# Patient Record
Sex: Female | Born: 1935 | Race: Black or African American | Hispanic: No | Smoking: Former smoker
Health system: Southern US, Community
[De-identification: ages and names within clinical notes are randomized; demographics above are authoritative.]

## PROBLEM LIST (undated history)

## (undated) DIAGNOSIS — I214 Non-ST elevation (NSTEMI) myocardial infarction: Secondary | ICD-10-CM

## (undated) DIAGNOSIS — Z95818 Presence of other cardiac implants and grafts: Secondary | ICD-10-CM

## (undated) DIAGNOSIS — I10 Essential (primary) hypertension: Secondary | ICD-10-CM

## (undated) DIAGNOSIS — F0390 Unspecified dementia without behavioral disturbance: Secondary | ICD-10-CM

## (undated) DIAGNOSIS — R55 Syncope and collapse: Secondary | ICD-10-CM

## (undated) DIAGNOSIS — I428 Other cardiomyopathies: Secondary | ICD-10-CM

## (undated) DIAGNOSIS — M1990 Unspecified osteoarthritis, unspecified site: Secondary | ICD-10-CM

## (undated) DIAGNOSIS — K219 Gastro-esophageal reflux disease without esophagitis: Secondary | ICD-10-CM

## (undated) DIAGNOSIS — K450 Other specified abdominal hernia with obstruction, without gangrene: Secondary | ICD-10-CM

## (undated) DIAGNOSIS — R569 Unspecified convulsions: Secondary | ICD-10-CM

## (undated) DIAGNOSIS — R0789 Other chest pain: Secondary | ICD-10-CM

## (undated) DIAGNOSIS — I6350 Cerebral infarction due to unspecified occlusion or stenosis of unspecified cerebral artery: Secondary | ICD-10-CM

## (undated) DIAGNOSIS — I2510 Atherosclerotic heart disease of native coronary artery without angina pectoris: Secondary | ICD-10-CM

## (undated) HISTORY — DX: Unspecified convulsions: R56.9

## (undated) HISTORY — PX: LOOP RECORDER IMPLANT: SHX5954

## (undated) HISTORY — DX: Atherosclerotic heart disease of native coronary artery without angina pectoris: I25.10

## (undated) HISTORY — PX: COLONOSCOPY: SHX174

## (undated) HISTORY — PX: EYE SURGERY: SHX253

## (undated) HISTORY — DX: Essential (primary) hypertension: I10

## (undated) HISTORY — DX: Cerebral infarction due to unspecified occlusion or stenosis of unspecified cerebral artery: I63.50

## (undated) HISTORY — DX: Other chest pain: R07.89

## (undated) HISTORY — DX: Other cardiomyopathies: I42.8

## (undated) HISTORY — DX: Syncope and collapse: R55

## (undated) HISTORY — PX: CATARACT EXTRACTION W/ INTRAOCULAR LENS  IMPLANT, BILATERAL: SHX1307

---

## 1998-02-06 ENCOUNTER — Encounter: Payer: Self-pay | Admitting: Family Medicine

## 1998-02-06 ENCOUNTER — Ambulatory Visit (HOSPITAL_COMMUNITY): Admission: RE | Admit: 1998-02-06 | Discharge: 1998-02-06 | Payer: Self-pay | Admitting: Family Medicine

## 1998-07-06 ENCOUNTER — Emergency Department (HOSPITAL_COMMUNITY): Admission: EM | Admit: 1998-07-06 | Discharge: 1998-07-07 | Payer: Self-pay | Admitting: Emergency Medicine

## 1998-07-09 ENCOUNTER — Emergency Department (HOSPITAL_COMMUNITY): Admission: EM | Admit: 1998-07-09 | Discharge: 1998-07-09 | Payer: Self-pay | Admitting: *Deleted

## 1998-07-09 ENCOUNTER — Encounter: Payer: Self-pay | Admitting: Emergency Medicine

## 1998-08-26 ENCOUNTER — Encounter: Payer: Self-pay | Admitting: Family Medicine

## 1998-08-26 ENCOUNTER — Ambulatory Visit (HOSPITAL_COMMUNITY): Admission: RE | Admit: 1998-08-26 | Discharge: 1998-08-26 | Payer: Self-pay | Admitting: Family Medicine

## 1998-08-27 ENCOUNTER — Ambulatory Visit (HOSPITAL_COMMUNITY): Admission: RE | Admit: 1998-08-27 | Discharge: 1998-08-27 | Payer: Self-pay | Admitting: Family Medicine

## 1998-08-27 ENCOUNTER — Encounter: Payer: Self-pay | Admitting: Family Medicine

## 1998-09-02 ENCOUNTER — Encounter: Payer: Self-pay | Admitting: Family Medicine

## 1998-09-02 ENCOUNTER — Ambulatory Visit (HOSPITAL_COMMUNITY): Admission: RE | Admit: 1998-09-02 | Discharge: 1998-09-02 | Payer: Self-pay | Admitting: Family Medicine

## 1998-10-14 ENCOUNTER — Inpatient Hospital Stay (HOSPITAL_COMMUNITY): Admission: RE | Admit: 1998-10-14 | Discharge: 1998-10-14 | Payer: Self-pay | Admitting: Internal Medicine

## 1998-11-22 ENCOUNTER — Encounter: Payer: Self-pay | Admitting: Cardiology

## 1998-11-22 ENCOUNTER — Ambulatory Visit (HOSPITAL_COMMUNITY): Admission: RE | Admit: 1998-11-22 | Discharge: 1998-11-22 | Payer: Self-pay | Admitting: Cardiology

## 1999-01-05 ENCOUNTER — Encounter: Payer: Self-pay | Admitting: Cardiology

## 1999-01-05 ENCOUNTER — Ambulatory Visit (HOSPITAL_COMMUNITY): Admission: RE | Admit: 1999-01-05 | Discharge: 1999-01-05 | Payer: Self-pay | Admitting: Cardiology

## 1999-01-22 ENCOUNTER — Emergency Department (HOSPITAL_COMMUNITY): Admission: EM | Admit: 1999-01-22 | Discharge: 1999-01-22 | Payer: Self-pay | Admitting: Emergency Medicine

## 1999-01-22 ENCOUNTER — Encounter: Payer: Self-pay | Admitting: Emergency Medicine

## 1999-03-01 ENCOUNTER — Encounter: Payer: Self-pay | Admitting: Emergency Medicine

## 1999-03-01 ENCOUNTER — Emergency Department (HOSPITAL_COMMUNITY): Admission: EM | Admit: 1999-03-01 | Discharge: 1999-03-01 | Payer: Self-pay | Admitting: Emergency Medicine

## 1999-05-20 ENCOUNTER — Encounter: Payer: Self-pay | Admitting: Gastroenterology

## 1999-05-20 ENCOUNTER — Ambulatory Visit (HOSPITAL_COMMUNITY): Admission: RE | Admit: 1999-05-20 | Discharge: 1999-05-20 | Payer: Self-pay | Admitting: Gastroenterology

## 1999-06-04 ENCOUNTER — Ambulatory Visit (HOSPITAL_COMMUNITY): Admission: RE | Admit: 1999-06-04 | Discharge: 1999-06-04 | Payer: Self-pay | Admitting: Gastroenterology

## 1999-10-16 ENCOUNTER — Ambulatory Visit (HOSPITAL_COMMUNITY): Admission: RE | Admit: 1999-10-16 | Discharge: 1999-10-16 | Payer: Self-pay | Admitting: Family Medicine

## 2000-01-29 ENCOUNTER — Encounter: Payer: Self-pay | Admitting: Cardiology

## 2000-01-29 ENCOUNTER — Ambulatory Visit (HOSPITAL_COMMUNITY): Admission: RE | Admit: 2000-01-29 | Discharge: 2000-01-29 | Payer: Self-pay | Admitting: Cardiology

## 2000-02-02 ENCOUNTER — Encounter: Payer: Self-pay | Admitting: Cardiology

## 2000-02-02 ENCOUNTER — Encounter: Admission: RE | Admit: 2000-02-02 | Discharge: 2000-02-02 | Payer: Self-pay | Admitting: Cardiology

## 2000-03-30 ENCOUNTER — Ambulatory Visit (HOSPITAL_COMMUNITY): Admission: RE | Admit: 2000-03-30 | Discharge: 2000-03-30 | Payer: Self-pay | Admitting: Cardiology

## 2000-04-05 ENCOUNTER — Encounter: Payer: Self-pay | Admitting: Cardiology

## 2000-04-05 ENCOUNTER — Ambulatory Visit (HOSPITAL_COMMUNITY): Admission: RE | Admit: 2000-04-05 | Discharge: 2000-04-05 | Payer: Self-pay | Admitting: Cardiology

## 2000-05-02 ENCOUNTER — Ambulatory Visit (HOSPITAL_COMMUNITY): Admission: RE | Admit: 2000-05-02 | Discharge: 2000-05-02 | Payer: Self-pay | Admitting: *Deleted

## 2000-11-19 ENCOUNTER — Encounter: Payer: Self-pay | Admitting: Emergency Medicine

## 2000-11-19 ENCOUNTER — Inpatient Hospital Stay (HOSPITAL_COMMUNITY): Admission: EM | Admit: 2000-11-19 | Discharge: 2000-11-21 | Payer: Self-pay | Admitting: Emergency Medicine

## 2000-11-20 ENCOUNTER — Encounter: Payer: Self-pay | Admitting: Emergency Medicine

## 2000-11-21 ENCOUNTER — Encounter: Payer: Self-pay | Admitting: Cardiovascular Disease

## 2001-04-17 ENCOUNTER — Emergency Department (HOSPITAL_COMMUNITY): Admission: EM | Admit: 2001-04-17 | Discharge: 2001-04-18 | Payer: Self-pay | Admitting: Emergency Medicine

## 2001-04-18 ENCOUNTER — Encounter: Payer: Self-pay | Admitting: Emergency Medicine

## 2001-05-01 ENCOUNTER — Encounter: Admission: RE | Admit: 2001-05-01 | Discharge: 2001-05-01 | Payer: Self-pay | Admitting: Orthopedic Surgery

## 2001-05-01 ENCOUNTER — Encounter: Payer: Self-pay | Admitting: Orthopedic Surgery

## 2001-05-16 ENCOUNTER — Encounter: Admission: RE | Admit: 2001-05-16 | Discharge: 2001-05-16 | Payer: Self-pay | Admitting: Orthopedic Surgery

## 2001-05-16 ENCOUNTER — Encounter: Payer: Self-pay | Admitting: Orthopedic Surgery

## 2001-05-30 ENCOUNTER — Encounter: Admission: RE | Admit: 2001-05-30 | Discharge: 2001-05-30 | Payer: Self-pay | Admitting: Orthopedic Surgery

## 2001-05-30 ENCOUNTER — Encounter: Payer: Self-pay | Admitting: Orthopedic Surgery

## 2001-11-03 ENCOUNTER — Emergency Department (HOSPITAL_COMMUNITY): Admission: EM | Admit: 2001-11-03 | Discharge: 2001-11-03 | Payer: Self-pay | Admitting: Emergency Medicine

## 2001-11-03 ENCOUNTER — Encounter: Payer: Self-pay | Admitting: Emergency Medicine

## 2001-12-22 ENCOUNTER — Ambulatory Visit (HOSPITAL_COMMUNITY): Admission: RE | Admit: 2001-12-22 | Discharge: 2001-12-22 | Payer: Self-pay | Admitting: Family Medicine

## 2002-02-28 ENCOUNTER — Ambulatory Visit (HOSPITAL_COMMUNITY): Admission: RE | Admit: 2002-02-28 | Discharge: 2002-02-28 | Payer: Self-pay | Admitting: Gastroenterology

## 2002-06-02 ENCOUNTER — Inpatient Hospital Stay (HOSPITAL_COMMUNITY): Admission: EM | Admit: 2002-06-02 | Discharge: 2002-06-04 | Payer: Self-pay | Admitting: Emergency Medicine

## 2002-06-02 ENCOUNTER — Encounter: Payer: Self-pay | Admitting: Cardiovascular Disease

## 2002-06-03 ENCOUNTER — Encounter: Payer: Self-pay | Admitting: Cardiovascular Disease

## 2002-06-21 ENCOUNTER — Encounter: Payer: Self-pay | Admitting: Internal Medicine

## 2002-06-21 ENCOUNTER — Ambulatory Visit (HOSPITAL_COMMUNITY): Admission: RE | Admit: 2002-06-21 | Discharge: 2002-06-21 | Payer: Self-pay | Admitting: Internal Medicine

## 2002-07-30 ENCOUNTER — Emergency Department (HOSPITAL_COMMUNITY): Admission: EM | Admit: 2002-07-30 | Discharge: 2002-07-31 | Payer: Self-pay | Admitting: Emergency Medicine

## 2002-09-05 ENCOUNTER — Encounter: Payer: Self-pay | Admitting: Cardiology

## 2002-09-05 ENCOUNTER — Ambulatory Visit (HOSPITAL_COMMUNITY): Admission: RE | Admit: 2002-09-05 | Discharge: 2002-09-05 | Payer: Self-pay | Admitting: Cardiology

## 2003-01-06 ENCOUNTER — Encounter: Payer: Self-pay | Admitting: Emergency Medicine

## 2003-01-07 ENCOUNTER — Inpatient Hospital Stay (HOSPITAL_COMMUNITY): Admission: EM | Admit: 2003-01-07 | Discharge: 2003-01-11 | Payer: Self-pay | Admitting: Emergency Medicine

## 2003-01-08 ENCOUNTER — Encounter: Payer: Self-pay | Admitting: Cardiology

## 2003-01-10 ENCOUNTER — Encounter: Payer: Self-pay | Admitting: Cardiology

## 2003-02-28 ENCOUNTER — Emergency Department (HOSPITAL_COMMUNITY): Admission: EM | Admit: 2003-02-28 | Discharge: 2003-02-28 | Payer: Self-pay | Admitting: Emergency Medicine

## 2003-03-01 ENCOUNTER — Ambulatory Visit (HOSPITAL_COMMUNITY): Admission: RE | Admit: 2003-03-01 | Discharge: 2003-03-01 | Payer: Self-pay | Admitting: Family Medicine

## 2003-03-21 ENCOUNTER — Emergency Department (HOSPITAL_COMMUNITY): Admission: EM | Admit: 2003-03-21 | Discharge: 2003-03-21 | Payer: Self-pay | Admitting: Emergency Medicine

## 2003-04-05 ENCOUNTER — Encounter (INDEPENDENT_AMBULATORY_CARE_PROVIDER_SITE_OTHER): Payer: Self-pay | Admitting: Specialist

## 2003-04-05 ENCOUNTER — Other Ambulatory Visit: Admission: RE | Admit: 2003-04-05 | Discharge: 2003-04-05 | Payer: Self-pay | Admitting: Oncology

## 2003-04-16 ENCOUNTER — Emergency Department (HOSPITAL_COMMUNITY): Admission: EM | Admit: 2003-04-16 | Discharge: 2003-04-16 | Payer: Self-pay | Admitting: Emergency Medicine

## 2003-05-14 ENCOUNTER — Encounter (HOSPITAL_COMMUNITY): Admission: RE | Admit: 2003-05-14 | Discharge: 2003-08-12 | Payer: Self-pay

## 2003-05-16 ENCOUNTER — Inpatient Hospital Stay (HOSPITAL_COMMUNITY): Admission: AD | Admit: 2003-05-16 | Discharge: 2003-05-18 | Payer: Self-pay | Admitting: *Deleted

## 2003-05-17 ENCOUNTER — Encounter (INDEPENDENT_AMBULATORY_CARE_PROVIDER_SITE_OTHER): Payer: Self-pay | Admitting: Specialist

## 2003-07-04 ENCOUNTER — Encounter (HOSPITAL_COMMUNITY): Admission: RE | Admit: 2003-07-04 | Discharge: 2003-10-02 | Payer: Self-pay | Admitting: Oncology

## 2003-07-15 ENCOUNTER — Ambulatory Visit (HOSPITAL_COMMUNITY): Admission: RE | Admit: 2003-07-15 | Discharge: 2003-07-15 | Payer: Self-pay | Admitting: Oncology

## 2003-08-01 ENCOUNTER — Encounter: Admission: RE | Admit: 2003-08-01 | Discharge: 2003-08-01 | Payer: Self-pay | Admitting: General Surgery

## 2003-08-02 ENCOUNTER — Ambulatory Visit (HOSPITAL_COMMUNITY): Admission: RE | Admit: 2003-08-02 | Discharge: 2003-08-02 | Payer: Self-pay | Admitting: General Surgery

## 2003-08-02 ENCOUNTER — Encounter (INDEPENDENT_AMBULATORY_CARE_PROVIDER_SITE_OTHER): Payer: Self-pay | Admitting: General Surgery

## 2003-08-02 ENCOUNTER — Ambulatory Visit (HOSPITAL_BASED_OUTPATIENT_CLINIC_OR_DEPARTMENT_OTHER): Admission: RE | Admit: 2003-08-02 | Discharge: 2003-08-02 | Payer: Self-pay | Admitting: General Surgery

## 2003-08-02 ENCOUNTER — Encounter (INDEPENDENT_AMBULATORY_CARE_PROVIDER_SITE_OTHER): Payer: Self-pay | Admitting: Specialist

## 2003-09-10 ENCOUNTER — Ambulatory Visit (HOSPITAL_COMMUNITY): Admission: RE | Admit: 2003-09-10 | Discharge: 2003-09-10 | Payer: Self-pay | Admitting: Oncology

## 2003-09-14 ENCOUNTER — Emergency Department (HOSPITAL_COMMUNITY): Admission: EM | Admit: 2003-09-14 | Discharge: 2003-09-14 | Payer: Self-pay | Admitting: Emergency Medicine

## 2003-09-17 ENCOUNTER — Ambulatory Visit (HOSPITAL_COMMUNITY): Admission: RE | Admit: 2003-09-17 | Discharge: 2003-09-17 | Payer: Self-pay

## 2004-01-28 ENCOUNTER — Inpatient Hospital Stay (HOSPITAL_COMMUNITY): Admission: EM | Admit: 2004-01-28 | Discharge: 2004-01-31 | Payer: Self-pay | Admitting: Cardiology

## 2004-01-28 ENCOUNTER — Encounter: Payer: Self-pay | Admitting: Cardiology

## 2004-03-26 ENCOUNTER — Ambulatory Visit (HOSPITAL_COMMUNITY): Admission: RE | Admit: 2004-03-26 | Discharge: 2004-03-26 | Payer: Self-pay | Admitting: Oncology

## 2004-03-27 ENCOUNTER — Emergency Department (HOSPITAL_COMMUNITY): Admission: EM | Admit: 2004-03-27 | Discharge: 2004-03-27 | Payer: Self-pay | Admitting: Emergency Medicine

## 2004-04-21 ENCOUNTER — Ambulatory Visit: Payer: Self-pay | Admitting: Oncology

## 2004-05-03 ENCOUNTER — Emergency Department (HOSPITAL_COMMUNITY): Admission: EM | Admit: 2004-05-03 | Discharge: 2004-05-03 | Payer: Self-pay | Admitting: Emergency Medicine

## 2004-05-05 ENCOUNTER — Encounter: Admission: RE | Admit: 2004-05-05 | Discharge: 2004-05-05 | Payer: Self-pay

## 2004-05-12 ENCOUNTER — Emergency Department (HOSPITAL_COMMUNITY): Admission: EM | Admit: 2004-05-12 | Discharge: 2004-05-13 | Payer: Self-pay | Admitting: Emergency Medicine

## 2004-05-16 ENCOUNTER — Emergency Department (HOSPITAL_COMMUNITY): Admission: EM | Admit: 2004-05-16 | Discharge: 2004-05-16 | Payer: Self-pay | Admitting: Emergency Medicine

## 2004-06-19 ENCOUNTER — Ambulatory Visit (HOSPITAL_COMMUNITY): Admission: RE | Admit: 2004-06-19 | Discharge: 2004-06-19 | Payer: Self-pay | Admitting: Gastroenterology

## 2004-06-19 ENCOUNTER — Encounter (INDEPENDENT_AMBULATORY_CARE_PROVIDER_SITE_OTHER): Payer: Self-pay | Admitting: Specialist

## 2004-06-23 ENCOUNTER — Encounter: Admission: RE | Admit: 2004-06-23 | Discharge: 2004-06-23 | Payer: Self-pay | Admitting: Gastroenterology

## 2004-06-30 ENCOUNTER — Encounter: Admission: RE | Admit: 2004-06-30 | Discharge: 2004-06-30 | Payer: Self-pay | Admitting: Gastroenterology

## 2004-07-21 ENCOUNTER — Ambulatory Visit: Payer: Self-pay | Admitting: Oncology

## 2004-07-24 ENCOUNTER — Ambulatory Visit (HOSPITAL_COMMUNITY): Admission: RE | Admit: 2004-07-24 | Discharge: 2004-07-24 | Payer: Self-pay | Admitting: Oncology

## 2004-09-08 ENCOUNTER — Encounter: Admission: RE | Admit: 2004-09-08 | Discharge: 2004-09-08 | Payer: Self-pay | Admitting: Gastroenterology

## 2004-09-20 ENCOUNTER — Emergency Department (HOSPITAL_COMMUNITY): Admission: EM | Admit: 2004-09-20 | Discharge: 2004-09-20 | Payer: Self-pay | Admitting: Emergency Medicine

## 2004-10-12 ENCOUNTER — Ambulatory Visit: Payer: Self-pay | Admitting: Oncology

## 2005-01-01 ENCOUNTER — Ambulatory Visit: Payer: Self-pay | Admitting: Oncology

## 2005-01-16 ENCOUNTER — Emergency Department (HOSPITAL_COMMUNITY): Admission: EM | Admit: 2005-01-16 | Discharge: 2005-01-17 | Payer: Self-pay | Admitting: Emergency Medicine

## 2005-01-19 ENCOUNTER — Ambulatory Visit (HOSPITAL_COMMUNITY): Admission: RE | Admit: 2005-01-19 | Discharge: 2005-01-19 | Payer: Self-pay | Admitting: Oncology

## 2005-01-30 ENCOUNTER — Emergency Department (HOSPITAL_COMMUNITY): Admission: EM | Admit: 2005-01-30 | Discharge: 2005-01-30 | Payer: Self-pay | Admitting: Emergency Medicine

## 2005-02-19 ENCOUNTER — Observation Stay (HOSPITAL_COMMUNITY): Admission: EM | Admit: 2005-02-19 | Discharge: 2005-02-19 | Payer: Self-pay | Admitting: Emergency Medicine

## 2005-02-20 ENCOUNTER — Encounter: Admission: RE | Admit: 2005-02-20 | Discharge: 2005-02-20 | Payer: Self-pay | Admitting: Neurology

## 2005-02-26 ENCOUNTER — Ambulatory Visit: Payer: Self-pay | Admitting: Oncology

## 2005-03-01 ENCOUNTER — Ambulatory Visit (HOSPITAL_COMMUNITY): Admission: RE | Admit: 2005-03-01 | Discharge: 2005-03-01 | Payer: Self-pay | Admitting: Cardiology

## 2005-06-28 ENCOUNTER — Ambulatory Visit: Payer: Self-pay | Admitting: Oncology

## 2005-08-03 ENCOUNTER — Encounter: Admission: RE | Admit: 2005-08-03 | Discharge: 2005-08-03 | Payer: Self-pay | Admitting: Gastroenterology

## 2005-08-05 ENCOUNTER — Emergency Department (HOSPITAL_COMMUNITY): Admission: EM | Admit: 2005-08-05 | Discharge: 2005-08-06 | Payer: Self-pay | Admitting: Emergency Medicine

## 2005-09-23 ENCOUNTER — Encounter: Admission: RE | Admit: 2005-09-23 | Discharge: 2005-09-23 | Payer: Self-pay | Admitting: Cardiology

## 2005-09-30 ENCOUNTER — Ambulatory Visit: Payer: Self-pay | Admitting: Oncology

## 2005-10-04 LAB — CBC WITH DIFFERENTIAL/PLATELET
Basophils Absolute: 0 10*3/uL (ref 0.0–0.1)
EOS%: 2 % (ref 0.0–7.0)
Eosinophils Absolute: 0.1 10*3/uL (ref 0.0–0.5)
LYMPH%: 22.6 % (ref 14.0–48.0)
MCH: 22.4 pg — ABNORMAL LOW (ref 26.0–34.0)
MCV: 69.8 fL — ABNORMAL LOW (ref 81.0–101.0)
MONO%: 6.4 % (ref 0.0–13.0)
NEUT#: 4.2 10*3/uL (ref 1.5–6.5)
Platelets: 267 10*3/uL (ref 145–400)
RBC: 4.8 10*6/uL (ref 3.70–5.32)
RDW: 19 % — ABNORMAL HIGH (ref 11.3–14.5)

## 2005-12-08 ENCOUNTER — Ambulatory Visit: Payer: Self-pay | Admitting: Oncology

## 2005-12-08 LAB — CBC WITH DIFFERENTIAL/PLATELET
BASO%: 0.3 % (ref 0.0–2.0)
Basophils Absolute: 0 10*3/uL (ref 0.0–0.1)
EOS%: 2.6 % (ref 0.0–7.0)
MCH: 22.5 pg — ABNORMAL LOW (ref 26.0–34.0)
MCHC: 32.5 g/dL (ref 32.0–36.0)
MCV: 69.2 fL — ABNORMAL LOW (ref 81.0–101.0)
MONO%: 8.7 % (ref 0.0–13.0)
NEUT%: 68.4 % (ref 39.6–76.8)
RDW: 19.7 % — ABNORMAL HIGH (ref 11.3–14.5)
lymph#: 1.3 10*3/uL (ref 0.9–3.3)

## 2006-01-21 LAB — CBC WITH DIFFERENTIAL/PLATELET
BASO%: 0.7 % (ref 0.0–2.0)
Basophils Absolute: 0 10*3/uL (ref 0.0–0.1)
EOS%: 3.7 % (ref 0.0–7.0)
HCT: 32.7 % — ABNORMAL LOW (ref 34.8–46.6)
HGB: 10.4 g/dL — ABNORMAL LOW (ref 11.6–15.9)
LYMPH%: 23.9 % (ref 14.0–48.0)
MCH: 22.4 pg — ABNORMAL LOW (ref 26.0–34.0)
MCHC: 31.9 g/dL — ABNORMAL LOW (ref 32.0–36.0)
MCV: 70.1 fL — ABNORMAL LOW (ref 81.0–101.0)
NEUT%: 64.1 % (ref 39.6–76.8)
Platelets: 287 10*3/uL (ref 145–400)

## 2006-03-14 ENCOUNTER — Encounter: Admission: RE | Admit: 2006-03-14 | Discharge: 2006-03-14 | Payer: Self-pay | Admitting: Cardiology

## 2006-05-03 ENCOUNTER — Ambulatory Visit (HOSPITAL_COMMUNITY): Admission: RE | Admit: 2006-05-03 | Discharge: 2006-05-03 | Payer: Self-pay | Admitting: Oncology

## 2006-07-03 ENCOUNTER — Emergency Department (HOSPITAL_COMMUNITY): Admission: EM | Admit: 2006-07-03 | Discharge: 2006-07-04 | Payer: Self-pay | Admitting: Emergency Medicine

## 2006-07-11 ENCOUNTER — Encounter (HOSPITAL_COMMUNITY): Admission: RE | Admit: 2006-07-11 | Discharge: 2006-09-15 | Payer: Self-pay | Admitting: Cardiology

## 2006-07-19 ENCOUNTER — Ambulatory Visit: Payer: Self-pay | Admitting: Oncology

## 2006-07-22 LAB — CBC WITH DIFFERENTIAL/PLATELET
BASO%: 1 % (ref 0.0–2.0)
EOS%: 6 % (ref 0.0–7.0)
LYMPH%: 17.3 % (ref 14.0–48.0)
MCHC: 31.5 g/dL — ABNORMAL LOW (ref 32.0–36.0)
MCV: 71.2 fL — ABNORMAL LOW (ref 81.0–101.0)
MONO%: 9.9 % (ref 0.0–13.0)
NEUT#: 4.4 10*3/uL (ref 1.5–6.5)
Platelets: 223 10*3/uL (ref 145–400)
RBC: 4.94 10*6/uL (ref 3.70–5.32)
RDW: 15.2 % — ABNORMAL HIGH (ref 11.3–14.5)
WBC: 6.7 10*3/uL (ref 3.9–10.0)

## 2006-11-15 ENCOUNTER — Ambulatory Visit: Payer: Self-pay | Admitting: Oncology

## 2006-12-07 ENCOUNTER — Encounter: Admission: RE | Admit: 2006-12-07 | Discharge: 2006-12-07 | Payer: Self-pay | Admitting: Cardiology

## 2007-04-05 ENCOUNTER — Emergency Department (HOSPITAL_COMMUNITY): Admission: EM | Admit: 2007-04-05 | Discharge: 2007-04-05 | Payer: Self-pay | Admitting: Emergency Medicine

## 2008-05-10 ENCOUNTER — Encounter: Admission: RE | Admit: 2008-05-10 | Discharge: 2008-05-10 | Payer: Self-pay | Admitting: Rheumatology

## 2008-07-17 ENCOUNTER — Inpatient Hospital Stay (HOSPITAL_COMMUNITY): Admission: EM | Admit: 2008-07-17 | Discharge: 2008-07-19 | Payer: Self-pay | Admitting: Emergency Medicine

## 2008-07-18 ENCOUNTER — Ambulatory Visit: Payer: Self-pay | Admitting: Vascular Surgery

## 2008-07-18 ENCOUNTER — Encounter (INDEPENDENT_AMBULATORY_CARE_PROVIDER_SITE_OTHER): Payer: Self-pay | Admitting: Cardiology

## 2008-07-26 ENCOUNTER — Ambulatory Visit: Payer: Self-pay | Admitting: Internal Medicine

## 2008-08-01 ENCOUNTER — Ambulatory Visit (HOSPITAL_COMMUNITY): Admission: RE | Admit: 2008-08-01 | Discharge: 2008-08-01 | Payer: Self-pay | Admitting: Internal Medicine

## 2008-08-01 ENCOUNTER — Ambulatory Visit: Payer: Self-pay | Admitting: Internal Medicine

## 2008-08-19 ENCOUNTER — Ambulatory Visit: Payer: Self-pay

## 2008-11-23 DIAGNOSIS — R569 Unspecified convulsions: Secondary | ICD-10-CM

## 2008-11-23 DIAGNOSIS — F068 Other specified mental disorders due to known physiological condition: Secondary | ICD-10-CM

## 2008-11-23 DIAGNOSIS — I6350 Cerebral infarction due to unspecified occlusion or stenosis of unspecified cerebral artery: Secondary | ICD-10-CM | POA: Insufficient documentation

## 2008-11-23 DIAGNOSIS — F0390 Unspecified dementia without behavioral disturbance: Secondary | ICD-10-CM

## 2008-11-23 DIAGNOSIS — I10 Essential (primary) hypertension: Secondary | ICD-10-CM

## 2008-11-23 HISTORY — DX: Unspecified dementia, unspecified severity, without behavioral disturbance, psychotic disturbance, mood disturbance, and anxiety: F03.90

## 2008-11-23 HISTORY — DX: Unspecified convulsions: R56.9

## 2008-11-23 HISTORY — DX: Cerebral infarction due to unspecified occlusion or stenosis of unspecified cerebral artery: I63.50

## 2008-11-23 HISTORY — DX: Essential (primary) hypertension: I10

## 2008-11-26 ENCOUNTER — Ambulatory Visit: Payer: Self-pay | Admitting: Internal Medicine

## 2008-11-26 DIAGNOSIS — R55 Syncope and collapse: Secondary | ICD-10-CM | POA: Insufficient documentation

## 2008-11-26 DIAGNOSIS — I4510 Unspecified right bundle-branch block: Secondary | ICD-10-CM

## 2009-01-30 ENCOUNTER — Encounter (HOSPITAL_COMMUNITY): Admission: RE | Admit: 2009-01-30 | Discharge: 2009-04-04 | Payer: Self-pay | Admitting: Cardiology

## 2009-04-08 ENCOUNTER — Ambulatory Visit: Payer: Self-pay | Admitting: Internal Medicine

## 2009-07-09 ENCOUNTER — Encounter: Payer: Self-pay | Admitting: Internal Medicine

## 2009-07-09 ENCOUNTER — Ambulatory Visit: Payer: Self-pay

## 2009-08-27 ENCOUNTER — Encounter: Admission: RE | Admit: 2009-08-27 | Discharge: 2009-08-27 | Payer: Self-pay | Admitting: Specialist

## 2009-09-22 ENCOUNTER — Emergency Department (HOSPITAL_COMMUNITY): Admission: EM | Admit: 2009-09-22 | Discharge: 2009-09-22 | Payer: Self-pay | Admitting: Emergency Medicine

## 2009-09-28 ENCOUNTER — Telehealth (INDEPENDENT_AMBULATORY_CARE_PROVIDER_SITE_OTHER): Payer: Self-pay | Admitting: Physician Assistant

## 2009-09-29 ENCOUNTER — Telehealth (INDEPENDENT_AMBULATORY_CARE_PROVIDER_SITE_OTHER): Payer: Self-pay | Admitting: *Deleted

## 2010-01-27 ENCOUNTER — Ambulatory Visit: Payer: Self-pay | Admitting: Internal Medicine

## 2010-01-27 DIAGNOSIS — R0602 Shortness of breath: Secondary | ICD-10-CM

## 2010-02-03 ENCOUNTER — Telehealth (INDEPENDENT_AMBULATORY_CARE_PROVIDER_SITE_OTHER): Payer: Self-pay | Admitting: *Deleted

## 2010-02-03 LAB — CONVERTED CEMR LAB
Basophils Relative: 0.3 % (ref 0.0–3.0)
CO2: 25 meq/L (ref 19–32)
Calcium: 8.9 mg/dL (ref 8.4–10.5)
Eosinophils Relative: 0.6 % (ref 0.0–5.0)
Lymphocytes Relative: 28.6 % (ref 12.0–46.0)
MCV: 71.8 fL — ABNORMAL LOW (ref 78.0–100.0)
Monocytes Absolute: 0.7 10*3/uL (ref 0.1–1.0)
Monocytes Relative: 8.9 % (ref 3.0–12.0)
Neutrophils Relative %: 61.6 % (ref 43.0–77.0)
RBC: 4.58 M/uL (ref 3.87–5.11)
Sodium: 136 meq/L (ref 135–145)
WBC: 8.3 10*3/uL (ref 4.5–10.5)

## 2010-02-04 ENCOUNTER — Ambulatory Visit: Payer: Self-pay

## 2010-02-04 ENCOUNTER — Encounter (HOSPITAL_COMMUNITY): Admission: RE | Admit: 2010-02-04 | Discharge: 2010-03-31 | Payer: Self-pay | Admitting: Internal Medicine

## 2010-02-04 ENCOUNTER — Encounter: Payer: Self-pay | Admitting: Cardiovascular Disease

## 2010-02-04 ENCOUNTER — Ambulatory Visit: Payer: Self-pay | Admitting: Cardiovascular Disease

## 2010-02-10 ENCOUNTER — Telehealth: Payer: Self-pay | Admitting: Cardiovascular Disease

## 2010-04-08 ENCOUNTER — Ambulatory Visit: Payer: Self-pay | Admitting: Cardiovascular Disease

## 2010-06-21 ENCOUNTER — Encounter: Payer: Self-pay | Admitting: Specialist

## 2010-06-21 ENCOUNTER — Encounter: Payer: Self-pay | Admitting: Cardiology

## 2010-06-30 NOTE — Cardiovascular Report (Signed)
Summary: Office Visit   Office Visit   Imported By: Roderic Ovens 07/22/2009 11:52:57  _____________________________________________________________________  External Attachment:    Type:   Image     Comment:   External Document
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
## 2010-06-30 NOTE — Procedures (Signed)
Summary: device/saf   Problems Prior to Update: 1)  Implantable Loop Recorder  (ICD-V45.00) 2)  Rbbb  (ICD-426.4) 3)  Syncope  (ICD-780.2) 4)  Cva  (ICD-434.91) 5)  Seizure Disorder  (ICD-780.39) 6)  Dementia  (ICD-294.8) 7)  Hypertension  (ICD-401.9)  Medications Prior to Update: 1)  Effexor Xr 37.5 Mg Xr24h-Cap (Venlafaxine Hcl) .Marland Kitchen.. 1 By Mouth Once Daily 2)  Aricept 5 Mg Tabs (Donepezil Hcl) .Marland Kitchen.. 1 By Mouth Once Daily 3)  Miralax  Powd (Polyethylene Glycol 3350) .... As Needed 4)  Aspirin 81 Mg Tbec (Aspirin) .... Take One Tablet By Mouth Daily 5)  Multivitamins   Tabs (Multiple Vitamin) .Marland Kitchen.. 1 By Mouth Once Daily 6)  Nexium 40 Mg Cpdr (Esomeprazole Magnesium) .Marland Kitchen.. 1 Tab Once Daily 7)  Cipro 500 Mg Tabs (Ciprofloxacin Hcl) .... Take One Tablet Two Times A Day For The Next 10 Days  Current Medications (verified): 1)  Effexor Xr 37.5 Mg Xr24h-Cap (Venlafaxine Hcl) .Marland Kitchen.. 1 By Mouth Once Daily 2)  Aricept 5 Mg Tabs (Donepezil Hcl) .Marland Kitchen.. 1 By Mouth Once Daily 3)  Miralax  Powd (Polyethylene Glycol 3350) .... As Needed 4)  Aspirin 81 Mg Tbec (Aspirin) .... Take One Tablet By Mouth Daily 5)  Multivitamins   Tabs (Multiple Vitamin) .Marland Kitchen.. 1 By Mouth Once Daily 6)  Nexium 40 Mg Cpdr (Esomeprazole Magnesium) .Marland Kitchen.. 1 Tab Once Daily 7)  Cipro 500 Mg Tabs (Ciprofloxacin Hcl) .... Take One Tablet Two Times A Day For The Next 10 Days  Allergies (verified): 1)  ! Tylenol   ILR Following MD Sherryl Manges, MD DOI:  08/01/2008 Vendor:  [employer]     Model Number:  [id number]     Serial Number [id number]       Tachy Episodes:  0     Brady Episodes:  1 ILR Next Due 11/28/2009  Tech Comments:  Normal device function.  Huston Foley episode was undersensing vs ? 4 beat run of NSVT.  Plan to recheck in 6 months with Dr Graciela Husbands. Pt will call back with syncopal spells in the meantime. Gypsy Balsam RN BSN  July 09, 2009 2:22 PM

## 2010-06-30 NOTE — Assessment & Plan Note (Signed)
Summary: Cardiology Nuclear Testing  Nuclear Med Background Indications for Stress Test: Evaluation for Ischemia   History: Abnormal EKG, Echo, Heart Catheterization, Myocardial Perfusion Study  History Comments: 02/10 Echo nl,mild MR 05 Cath min nonobs CAD 08 MPS nl 03/10 Loop recorder  Symptoms: Chest Pain, Dizziness, DOE, Fatigue, Light-Headedness, Palpitations, SOB    Nuclear Pre-Procedure Cardiac Risk Factors: Carotid Disease, CVA, RBBB Caffeine/Decaff Intake: None NPO After: 7:00 AM Lungs: clear IV 0.9% NS with Angio Cath: 22g     IV Site: R Antecubital IV Started by: Bonnita Levan, RN Chest Size (in) 36     Cup Size B     Height (in): 62 Weight (lb): 126 BMI: 23.13  Nuclear Med Study 1 or 2 day study:  1 day     Stress Test Type:  Eugenie Birks Reading MD:  Charlton Haws, MD     Referring MD:  P.Nishan Resting Radionuclide:  Technetium 39m Tetrofosmin     Resting Radionuclide Dose:  11 mCi  Stress Radionuclide:  Technetium 53m Tetrofosmin     Stress Radionuclide Dose:  33 mCi   Stress Protocol   Lexiscan: 0.4 mg   Stress Test Technologist:  Milana Na, EMT-P     Nuclear Technologist:  Domenic Polite, CNMT  Rest Procedure  Myocardial perfusion imaging was performed at rest 45 minutes following the intravenous administration of Technetium 45m Tetrofosmin.  Stress Procedure  The patient received IV Lexiscan 0.4 mg over 15-seconds.  Technetium 55m Tetrofosmin injected at 30-seconds.  There were no significant changes with infusion.  Quantitative spect images were obtained after a 45 minute delay.  QPS Raw Data Images:  Normal; no motion artifact; normal heart/lung ratio. Stress Images:  Normal homogeneous uptake in all areas of the myocardium. Rest Images:  Normal homogeneous uptake in all areas of the myocardium. Subtraction (SDS):  SDS 1 Transient Ischemic Dilatation:  0.98  (Normal <1.22)  Lung/Heart Ratio:  0.31  (Normal <0.45)  Quantitative Gated Spect  Images QGS cine images:  Not gated  Findings Normal nuclear study      Overall Impression  Exercise Capacity: Lexiscan with no exercise. BP Response: Normal blood pressure response. Clinical Symptoms: Dyspnea ECG Impression: SR RBBB LVH Overall Impression: Normal stress nuclear study.  Appended Document: Cardiology Nuclear Testing LMTCB./CY  Appended Document: Cardiology Nuclear Testing PT AWARE OF TEST RESULTS./CY

## 2010-06-30 NOTE — Progress Notes (Signed)
Summary: returning [name]  Phone Note [name] from Patient [name] back at Home Phone [phone]   Caller: Patient Reason for [name]: Talk to Nurse Details for Reason: returning [name] Initial [name] taken by: Lorne Skeens,  February 10, 2010 2:21 PM  Follow-up for Phone [name]        Phone [name] Completed  PT AWARE OF TEST RESULTS. Follow-up by: Scherrie Bateman, LPN,  February 10, 2010 3:03 PM
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
## 2010-06-30 NOTE — Progress Notes (Signed)
Summary: nuc pre procedure  Phone Note Outgoing Call Call back at Home Phone [phone]   Call placed by: Darrick Penna Call placed to: Patient Reason for Call: Confirm/change Appt Summary of Call: Reviewed information on Myoview Information Sheet (see scanned document for further details).  Spoke with patient.      Nuclear Med Background Indications for Stress Test: Evaluation for Ischemia   History: Echo, Heart Catheterization, Myocardial Perfusion Study  History Comments: 02/10 Echo nl,mild MR 05 Cath min nonobs CAD 08 MPS nl 03/10 Loop recorder  Symptoms: Chest Pain, DOE    Nuclear Pre-Procedure Cardiac Risk Factors: Carotid Disease, CVA, RBBB Height (in): 62

## 2010-06-30 NOTE — Procedures (Signed)
Summary: per check out/saf   Current Medications (verified): 1)  Effexor Xr 37.5 Mg Xr24h-Cap (Venlafaxine Hcl) .Marland Kitchen.. 1 By Mouth Once Daily 2)  Aricept 5 Mg Tabs (Donepezil Hcl) .Marland Kitchen.. 1 By Mouth Once Daily 3)  Miralax  Powd (Polyethylene Glycol 3350) .... As Needed 4)  Aspirin 81 Mg Tbec (Aspirin) .... Take One Tablet By Mouth Daily 5)  Multivitamins   Tabs (Multiple Vitamin) .Marland Kitchen.. 1 By Mouth Once Daily 6)  Nexium 40 Mg Cpdr (Esomeprazole Magnesium) .Marland Kitchen.. 1 Tab Once Daily 7)  Prednisone 5 Mg/71ml Soln (Prednisone) .... 2 Tabs Once Daily  Allergies (verified): 1)  ! Codeine   ILR Following MD Sherryl Manges, MD DOI:  08/01/2008 Vendor:  [employer]     Model Number:  [id number]     Serial Number [id number]       Tachy Episodes:  0     Brady Episodes:  0 ILR Next Due 07/01/2010  Tech Comments:  No parameter changes. No episodes noted.  ROV 3months with Dr. Graciela Husbands. Altha Harm, LPN  April 08, 2010 2:47 PM

## 2010-06-30 NOTE — Assessment & Plan Note (Signed)
Summary: device/saf   Referring Ashland Wiseman:  Donia Guiles   History of Present Illness: MHarris is seen in followup for syncope. She has had no intercurrent events. She is status post loop recorder implantation.  She is complaining of progressive dyspnea on exertion associated with a left-sided chest pain that radiates into her axilla. She is short of breath walking about 100 feet. She does become short of breath talking.  Review of her prior evaluation demonstrates that she had a catheterization in about 5 or 6 years ago with mild disease. An echo cardiogram a few years ago demonstrated normal left ventricular function. She has not had a heart attack that she knows about.  She does have hypertension.    She has complaints of chest pain on her left side in the vicinity of her device. It extends up into the axilla  Current Medications (verified): 1)  Effexor Xr 37.5 Mg Xr24h-Cap (Venlafaxine Hcl) .Marland Kitchen.. 1 By Mouth Once Daily 2)  Aricept 5 Mg Tabs (Donepezil Hcl) .Marland Kitchen.. 1 By Mouth Once Daily 3)  Miralax  Powd (Polyethylene Glycol 3350) .... As Needed 4)  Aspirin 81 Mg Tbec (Aspirin) .... Take One Tablet By Mouth Daily 5)  Multivitamins   Tabs (Multiple Vitamin) .Marland Kitchen.. 1 By Mouth Once Daily 6)  Nexium 40 Mg Cpdr (Esomeprazole Magnesium) .Marland Kitchen.. 1 Tab Once Daily 7)  Azathioprine 50 Mg Tabs (Azathioprine) .Marland Kitchen.. 1 Once Daily 8)  Prednisone 5 Mg/52ml Soln (Prednisone) .... 2 Tabs Once Daily  Allergies (verified): 1)  ! Tylenol  Past History:  Past Medical History: Last updated: 11/23/2008 CVA (ICD-434.91) SEIZURE DISORDER (ICD-780.39) CAD (ICD-414.00) DEMENTIA (ICD-294.8) HYPERTENSION (ICD-401.9)  Past Surgical History: Last updated: 11/23/2008   Implantation of a loop recorder.  08/01/2008  Duke Salvia, MD  Family History: Last updated: 11/23/2008  The patient states that her mother died at the age of 107.  She had had some heart problems.  Her father had cerebrovascular disease.  There is no cancer in the family.  Vital Signs:  Patient profile:   78 year old female Height:      62 inches Weight:      127.8 pounds BMI:     23.46 Pulse rate:   65 / minute Pulse rhythm:   irregular BP sitting:   144 / 82  (right arm) Cuff size:   regular  Vitals Entered By: Judithe Modest CMA (January 27, 2010 1:09 PM)  Physical Exam  General:  The patient was alert and oriented in no acute distress. HEENT Normal.  Neck veins were 8-9 centimeters, carotids were brisk.  Lungs were clear.  Heart sounds were regular without murmurs or gallops.  Abdomen was soft with active bowel sounds. There is no clubbing cyanosis or edema. Skin Warm and dry    EKG  Procedure date:  01/27/2010  Findings:      sinus rhythm at 65 Intervals 0.14/0.15/0.45 Right bundle branch block Left ventricular hypertrophy with repolarization abnormalities    ILR Following MD Sherryl Manges, MD DOI:  08/01/2008 Vendor:  [employer]     Model Number:  [id number]     Serial Number [id number]       Tachy Episodes:  0     Brady Episodes:  0 ILR Next Due 03/31/2010  Tech Comments:  2 asystole episodes both undersensing.  Ms. Fojtik c/o SOB with minimal exertion.  EKG today sr with PAC's.    Impression & Recommendations:  Problem # 1:  IMPLANTABLE LOOP RECORDER (ICD-V45.00) no intercurrent  episodes  Problem # 2:  CHEST PAIN (ICD-786.50) her chest pain story is concerning for angina especially given the concomitant shortness of breath that is really quite limiting. This is true notwithstanding the catheterization 5 years ago. As such with limited disease that she did have her abnormal cardiogram we'll proceed with Myoview scanning. Her updated medication list for this problem includes:    Aspirin 81 Mg Tbec (Aspirin) .Marland Kitchen... Take one tablet by mouth daily  Problem # 3:  DYSPNEA (ICD-786.05) the dyspnea is primarily with exertion. We'll plan to check renal function as well as thyroid status and hemoglobin to  see if we can identify a potential metabolic contribution to her dyspnea.  Her updated medication list for this problem includes:    Aspirin 81 Mg Tbec (Aspirin) .Marland Kitchen... Take one tablet by mouth daily  Other Orders: EKG w/ Interpretation (93000) TLB-BMP (Basic Metabolic Panel-BMET) (80048-METABOL) TLB-BNP (B-Natriuretic Peptide) (83880-BNPR) TLB-CBC Platelet - w/Differential (85025-CBCD) TLB-TSH (Thyroid Stimulating Hormone) (84443-TSH) Nuclear Stress Test (Nuc Stress Test)  Patient Instructions: 1)  Your physician recommends that you schedule a follow-up appointment in: 3 MONTHS WITH KRISITN AND PAULA. 2)  Your physician recommends that you HAVE LAB WORK TODAY: BMET, BNP, CBC, AND TSH. 3)  Your physician recommends that you continue on your current medications as directed. Please refer to the Current Medication list given to you today. 4)  Your physician has requested that you have a LEXISCAN stress myoview.  For further information please visit [email or web address]  Please follow instruction sheet, as given.

## 2010-07-03 NOTE — Cardiovascular Report (Signed)
Summary: Office Visit   Office Visit   Imported By: Roderic Ovens 01/28/2010 12:27:05  _____________________________________________________________________  External Attachment:    Type:   Image     Comment:   External Document

## 2010-07-17 ENCOUNTER — Telehealth: Payer: Self-pay | Admitting: Internal Medicine

## 2010-07-22 NOTE — Progress Notes (Signed)
Summary: need clearance for eye surgery/ due to loop recorder  Phone Note Call from Patient Call back at [phone]   Caller: Daughter/ Angelique Blonder Summary of Call: Pt having eye surgery and need a clearance and it need to be faxed to [facility]  [phone]/ surgery is on Monday Initial call taken by: Judie Grieve,  July 17, 2010 11:14 AM  Follow-up for Phone Call        Phone Call Completed DAUGHTER AWARE PT NEEDS PRE OP APPT PER [facility] PAC  PRIOR TO CLEARING FOR CATARACT SX. DAUGHTER TO CALL [facility] S[facility] AND WILL CALL BACK TO SCHEDULE AN APPT WITH [facility] ON DAY DR Graciela Husbands HERE. Follow-up by: Scherrie Bateman, LPN,  July 17, 2010 1:01 PM

## 2010-07-27 ENCOUNTER — Encounter: Payer: Self-pay | Admitting: Internal Medicine

## 2010-07-28 ENCOUNTER — Encounter: Payer: Self-pay | Admitting: Internal Medicine

## 2010-07-28 ENCOUNTER — Encounter (INDEPENDENT_AMBULATORY_CARE_PROVIDER_SITE_OTHER): Payer: Medicare Other

## 2010-07-28 ENCOUNTER — Encounter: Payer: Self-pay | Admitting: Physician Assistant

## 2010-07-28 ENCOUNTER — Encounter (INDEPENDENT_AMBULATORY_CARE_PROVIDER_SITE_OTHER): Payer: Medicare Other | Admitting: Physician Assistant

## 2010-07-28 DIAGNOSIS — R55 Syncope and collapse: Secondary | ICD-10-CM

## 2010-07-28 DIAGNOSIS — Z01810 Encounter for preprocedural cardiovascular examination: Secondary | ICD-10-CM

## 2010-08-06 NOTE — Procedures (Signed)
Summary: Cardiology Device Clinic   Current Medications (verified): 1)  Effexor Xr 37.5 Mg Xr24h-Cap (Venlafaxine Hcl) .Marland Kitchen.. 1 By Mouth Once Daily 2)  Aricept 5 Mg Tabs (Donepezil Hcl) .Marland Kitchen.. 1 By Mouth Once Daily 3)  Miralax  Powd (Polyethylene Glycol 3350) .... As Needed 4)  Aspirin 81 Mg Tbec (Aspirin) .... Take One Tablet By Mouth Daily 5)  Multivitamins   Tabs (Multiple Vitamin) .Marland Kitchen.. 1 By Mouth Once Daily 6)  Nexium 40 Mg Cpdr (Esomeprazole Magnesium) .Marland Kitchen.. 1 Tab Once Daily 7)  Prednisone 5 Mg/36ml Soln (Prednisone) .... 2 Tabs Once Daily  Allergies (verified): 1)  ! Codeine    ILR Following MD Sherryl Manges, MD DOI:  08/01/2008 Vendor:  [employer]     Model Number:  [id number]     Serial Number [id number]       Huston Foley Episodes:  1 ILR Next Due 09/02/2010  Tech Comments:  1 Brady episode lasting 9 seconds.  No other episodes.  ROV in April with SK. Vella Kohler  July 28, 2010 4:43 PM

## 2010-08-06 NOTE — Assessment & Plan Note (Signed)
Summary: SURGICAL CLEARANCE FOR EYE SURGERY   Visit Type:  surgical clearance Referring Provider:  Donia Guiles  CC:  shortness of breath if walk for long a time.  History of Present Illness: Primary Electrophysiologist:  Dr. Sherryl Manges  [name] is a 77 yo female with a h/o hypertension, prior stroke, dementia and seizures.  She has been evaluated by Dr. Graciela Husbands in the past for unexplained syncope.  She has a right bundle branch block.  She underwent implantation of a loop recorder in March 2010.  To date, she has not had any events recorded.  She has a history of nonobstructive coronary disease.  Cardiac catheterization in September 2005 demonstrated 10-15% plaque in the LAD; 15-20% plaque in the circumflex; 10-15% plaque in the RCA; 20% ostial and 20-30% mid ramus intermediate stenosis.  Her EF was 60-65%.  Echocardiogram in February 2010 demonstrates an EF of 55-60%; mild LVH; mild MR.  When she saw Dr. Graciela Husbands in September 2011, she complained of some dyspnea.  A Myoview study was negative for ischemia.  She now needs cataract surgery and presents for surgical clearance.  She denies any chest pain.  She lives by herself and does most of her cleaning.  She does have to stop sometimes when she gets tired.  She denies any significant changes in her shortness of breath with exertion.  She probably describes NYHA class IIB to 3 symptoms.  She denies orthopnea, PND or pedal edema.  She denies any change in her symptoms since her stress test last year.  The patient does not recall any syncopal episodes recently.  However, when her daughter came back to the room, she did recall the patient did pass out about 2-3 months ago.  [facility]  She had not eaten that morning.  Paramedics did come and gave her something to eat.  She felt much better after this.  Current Medications (verified): 1)  Effexor Xr 37.5 Mg Xr24h-Cap (Venlafaxine Hcl) .Marland Kitchen.. 1 By Mouth Once Daily 2)  Aricept 5 Mg  Tabs (Donepezil Hcl) .Marland Kitchen.. 1 By Mouth Once Daily 3)  Miralax  Powd (Polyethylene Glycol 3350) .... As Needed 4)  Aspirin 81 Mg Tbec (Aspirin) .... Take One Tablet By Mouth Daily 5)  Multivitamins   Tabs (Multiple Vitamin) .Marland Kitchen.. 1 By Mouth Once Daily 6)  Nexium 40 Mg Cpdr (Esomeprazole Magnesium) .Marland Kitchen.. 1 Tab Once Daily 7)  Prednisone 5 Mg/14ml Soln (Prednisone) .... 2 Tabs Once Daily  Allergies (verified): 1)  ! Codeine  Past History:  Past Medical History: Last updated: 11/23/2008 CVA (ICD-434.91) SEIZURE DISORDER (ICD-780.39) CAD (ICD-414.00) DEMENTIA (ICD-294.8) HYPERTENSION (ICD-401.9)  Past Surgical History: Last updated: 11/23/2008   Implantation of a loop recorder.  08/01/2008  Duke Salvia, MD  Social History: Reviewed history from 11/23/2008 and no changes required.   She lives alone.  She works intermittently as a Comptroller.  She does not use tobacco or alcohol.  She was transfused when she was anemic  many years ago.   Does not smoke, drink, or doing illicit drugs.   Review of Systems       As per  the HPI.  All other systems reviewed and negative.   Vital Signs:  Patient profile:   77 year old female Height:      62 inches Weight:      132 pounds BMI:     24.23 Pulse rate:   87 / minute BP sitting:   136 / 74  (  left arm) Cuff size:   large  Vitals Entered By: Caralee Ates CMA (July 28, 2010 2:38 PM)  Physical Exam  General:  Well nourished, well developed, in no acute distress HEENT: normal Neck: no JVD Cardiac:  normal S1, S2; RRR; no murmur Lungs:  clear to auscultation bilaterally, no wheezing, rhonchi or rales Abd: soft, nontender, no hepatomegaly Ext: no  edema Vascular: no carotid  bruits Skin: warm and dry Neuro:  CNs 2-12 intact, no focal abnormalities noted    EKG  Procedure date:  07/28/2010  Findings:      normal sinus rhythm Heart rate 87 Normal axis Right bundle branch block Nonspecific ST-T wave changes No significant  change when compared to previous tracing   ILR Following MD Sherryl Manges, MD DOI:  08/01/2008 Vendor:  [employer]     Model Number:  [id number]     Serial Number [id number]        Impression & Recommendations:  Problem # 1:  PRE-OPERATIVE CARDIOVASCULAR EXAMINATION (ICD-V72.81) She does not have any history of unstable cardiac conditions recently.  She had a low risk Myoview study in September 2011.  Her functional status has not changed since that time.  According to ACC/AHA guidelines, she requires no further cardiac workup prior to her noncardiac procedure.  She should be at acceptable risk.  It is acceptable for her to hold her aspirin pre-and post if necessary.  Problem # 2:  SYNCOPE (ICD-780.2) She apparently had a syncopal episode in the recent past.  We interrogated her device.  She had an episode of bradycardia with heart rates in the 30s in December.  Her daughter does not think this corresponds to her recent history of syncope.  I reviewed the results from her loop recorder with Dr. Graciela Husbands.  At this point, we do not think she definitively needs pacemaker implantation.  She will need to be monitored over time.  She has an appointment with Dr. Graciela Husbands in April.  She can keep this appointment.  These findings do not preclude her from having her cataract surgery.  Other Orders: EKG w/ Interpretation (93000)  Patient Instructions: 1)  Your physician recommends that you schedule a follow-up appointment in: Pt. to keep appointment with Dr. Graciela Husbands on April 4th at 3:00 PM 2)  Your physician recommends that you continue on your current medications as directed. Please refer to the Current Medication list given to you today.

## 2010-08-18 LAB — POCT I-STAT, CHEM 8
BUN: 28 mg/dL — ABNORMAL HIGH (ref 6–23)
Creatinine, Ser: 0.9 mg/dL (ref 0.4–1.2)
Hemoglobin: 12.9 g/dL (ref 12.0–15.0)
Potassium: 4.3 mEq/L (ref 3.5–5.1)
Sodium: 140 mEq/L (ref 135–145)

## 2010-08-18 LAB — DIFFERENTIAL
Lymphs Abs: 0.7 10*3/uL (ref 0.7–4.0)
Monocytes Relative: 5 % (ref 3–12)
Neutro Abs: 8.6 10*3/uL — ABNORMAL HIGH (ref 1.7–7.7)
Neutrophils Relative %: 89 % — ABNORMAL HIGH (ref 43–77)

## 2010-08-18 LAB — POCT CARDIAC MARKERS
CKMB, poc: 8.8 ng/mL (ref 1.0–8.0)
Myoglobin, poc: 281 ng/mL (ref 12–200)

## 2010-08-18 LAB — URINALYSIS, ROUTINE W REFLEX MICROSCOPIC
Ketones, ur: 15 mg/dL — AB
Protein, ur: NEGATIVE mg/dL
Urobilinogen, UA: 0.2 mg/dL (ref 0.0–1.0)

## 2010-08-18 LAB — GLUCOSE, CAPILLARY: Glucose-Capillary: 86 mg/dL (ref 70–99)

## 2010-08-18 LAB — CBC
RBC: 4.97 MIL/uL (ref 3.87–5.11)
WBC: 9.7 10*3/uL (ref 4.0–10.5)

## 2010-08-18 LAB — URINE MICROSCOPIC-ADD ON

## 2010-08-18 LAB — CK: Total CK: 382 U/L — ABNORMAL HIGH (ref 7–177)

## 2010-08-27 NOTE — Cardiovascular Report (Signed)
Summary: Office Visit   Office Visit   Imported By: Roderic Ovens 08/17/2010 13:55:35  _____________________________________________________________________  External Attachment:    Type:   Image     Comment:   External Document

## 2010-09-01 ENCOUNTER — Encounter: Payer: Self-pay | Admitting: Internal Medicine

## 2010-09-02 ENCOUNTER — Encounter: Payer: Self-pay | Admitting: Internal Medicine

## 2010-09-02 ENCOUNTER — Ambulatory Visit (INDEPENDENT_AMBULATORY_CARE_PROVIDER_SITE_OTHER): Payer: Medicare Other | Admitting: Internal Medicine

## 2010-09-02 DIAGNOSIS — I10 Essential (primary) hypertension: Secondary | ICD-10-CM

## 2010-09-02 DIAGNOSIS — Z959 Presence of cardiac and vascular implant and graft, unspecified: Secondary | ICD-10-CM

## 2010-09-02 DIAGNOSIS — I4510 Unspecified right bundle-branch block: Secondary | ICD-10-CM

## 2010-09-02 DIAGNOSIS — R0789 Other chest pain: Secondary | ICD-10-CM

## 2010-09-02 DIAGNOSIS — R55 Syncope and collapse: Secondary | ICD-10-CM
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
## 2010-09-02 NOTE — Assessment & Plan Note (Signed)
She continues to have symptoms of her atypical almost certainly noncardiac chest pain.

## 2010-09-02 NOTE — Assessment & Plan Note (Signed)
Well controlled 

## 2010-09-02 NOTE — Assessment & Plan Note (Signed)
As noted previously a demonstrated a 9 second pause with a heart rate of about 25.

## 2010-09-02 NOTE — Assessment & Plan Note (Signed)
As noted, in [place] sstudy, about 1/2 of the 50% who needed pacer implantation follwing loop recorder insetion demonstrted sinus node dysfunction

## 2010-09-02 NOTE — Patient Instructions (Signed)
Please call our office to schedule Pacer Implant. [phone] Ask to speak with Dr.Klein's nurse.

## 2010-09-02 NOTE — Assessment & Plan Note (Addendum)
Given the finding on her loop recorder, is appropriate I think to proceed with loop recorder explantation and pacemaker implantation given the profound sinus bradycardia noted The benefits and risks were reviewed including but not limited to death,  perforation, infection, lead dislodgement and device malfunction.  The patient understands agrees and is willing to proceed.

## 2010-09-04 ENCOUNTER — Encounter: Payer: Self-pay | Admitting: Internal Medicine

## 2010-09-15 LAB — URINE MICROSCOPIC-ADD ON

## 2010-09-15 LAB — CBC
HCT: 34.8 % — ABNORMAL LOW (ref 36.0–46.0)
Hemoglobin: 11 g/dL — ABNORMAL LOW (ref 12.0–15.0)
MCHC: 31.5 g/dL (ref 30.0–36.0)
MCHC: 31.6 g/dL (ref 30.0–36.0)
MCV: 71 fL — ABNORMAL LOW (ref 78.0–100.0)
Platelets: 259 10*3/uL (ref 150–400)
RBC: 4.91 MIL/uL (ref 3.87–5.11)
RDW: 20.7 % — ABNORMAL HIGH (ref 11.5–15.5)
WBC: 7.9 10*3/uL (ref 4.0–10.5)

## 2010-09-15 LAB — DIFFERENTIAL
Basophils Relative: 0 % (ref 0–1)
Eosinophils Absolute: 0.1 10*3/uL (ref 0.0–0.7)
Eosinophils Relative: 1 % (ref 0–5)
Lymphs Abs: 1.7 10*3/uL (ref 0.7–4.0)
Monocytes Absolute: 0.5 10*3/uL (ref 0.1–1.0)
Neutro Abs: 5.6 10*3/uL (ref 1.7–7.7)
Neutrophils Relative %: 72 % (ref 43–77)

## 2010-09-15 LAB — RETICULOCYTES: Retic Count, Absolute: 70.5 10*3/uL (ref 19.0–186.0)

## 2010-09-15 LAB — CARDIAC PANEL(CRET KIN+CKTOT+MB+TROPI)
CK, MB: 2.2 ng/mL (ref 0.3–4.0)
CK, MB: 3 ng/mL (ref 0.3–4.0)
Total CK: 115 U/L (ref 7–177)
Total CK: 91 U/L (ref 7–177)
Troponin I: 0.07 ng/mL — ABNORMAL HIGH (ref 0.00–0.06)
Troponin I: 0.07 ng/mL — ABNORMAL HIGH (ref 0.00–0.06)

## 2010-09-15 LAB — URINALYSIS, ROUTINE W REFLEX MICROSCOPIC
Glucose, UA: NEGATIVE mg/dL
Ketones, ur: NEGATIVE mg/dL
Protein, ur: NEGATIVE mg/dL
pH: 6 (ref 5.0–8.0)

## 2010-09-15 LAB — IRON AND TIBC
Iron: 64 ug/dL (ref 42–135)
UIBC: 180 ug/dL

## 2010-09-15 LAB — BASIC METABOLIC PANEL
CO2: 28 mEq/L (ref 19–32)
Chloride: 106 mEq/L (ref 96–112)
Creatinine, Ser: 0.84 mg/dL (ref 0.4–1.2)
GFR calc Af Amer: >60 mL/min (ref >60)
Potassium: 3.6 mEq/L (ref 3.5–5.1)

## 2010-09-15 LAB — COMPREHENSIVE METABOLIC PANEL
ALT: 18 U/L (ref 0–35)
Calcium: 8.6 mg/dL (ref 8.4–10.5)
GFR calc Af Amer: >60 mL/min (ref >60)
Glucose, Bld: 132 mg/dL — ABNORMAL HIGH (ref 70–99)
Sodium: 140 mEq/L (ref 135–145)
Total Protein: 6 g/dL (ref 6.0–8.3)

## 2010-09-15 LAB — FERRITIN: Ferritin: 532 ng/mL — ABNORMAL HIGH (ref 10–291)

## 2010-09-15 LAB — POCT CARDIAC MARKERS
CKMB, poc: 1.8 ng/mL (ref 1.0–8.0)
Troponin i, poc: <0.05 ng/mL (ref 0.00–0.09)

## 2010-09-15 LAB — PROTIME-INR: INR: 1 (ref 0.00–1.49)

## 2010-09-15 LAB — LIPID PANEL: VLDL: 24 mg/dL (ref 0–40)

## 2010-09-15 LAB — VITAMIN B12: Vitamin B-12: 242 pg/mL (ref 211–911)

## 2010-09-19 ENCOUNTER — Emergency Department (HOSPITAL_COMMUNITY)
Admission: EM | Admit: 2010-09-19 | Discharge: 2010-09-20 | Disposition: A | Discharge status: Home / Self Care | Payer: Medicare Other | Attending: Emergency Medicine | Admitting: Emergency Medicine

## 2010-09-19 ENCOUNTER — Emergency Department (HOSPITAL_COMMUNITY): Payer: Medicare Other

## 2010-09-19 DIAGNOSIS — I10 Essential (primary) hypertension: Secondary | ICD-10-CM | POA: Insufficient documentation

## 2010-09-19 DIAGNOSIS — S1093XA Contusion of unspecified part of neck, initial encounter: Secondary | ICD-10-CM | POA: Insufficient documentation

## 2010-09-19 DIAGNOSIS — F068 Other specified mental disorders due to known physiological condition: Secondary | ICD-10-CM | POA: Insufficient documentation

## 2010-09-19 DIAGNOSIS — R509 Fever, unspecified: Secondary | ICD-10-CM | POA: Insufficient documentation

## 2010-09-19 DIAGNOSIS — W1809XA Striking against other object with subsequent fall, initial encounter: Secondary | ICD-10-CM | POA: Insufficient documentation

## 2010-09-19 DIAGNOSIS — S0003XA Contusion of scalp, initial encounter: Secondary | ICD-10-CM | POA: Insufficient documentation

## 2010-09-19 DIAGNOSIS — R220 Localized swelling, mass and lump, head: Secondary | ICD-10-CM | POA: Insufficient documentation

## 2010-09-19 DIAGNOSIS — S0990XA Unspecified injury of head, initial encounter: Secondary | ICD-10-CM | POA: Insufficient documentation

## 2010-09-19 DIAGNOSIS — Y92009 Unspecified place in unspecified non-institutional (private) residence as the place of occurrence of the external cause: Secondary | ICD-10-CM | POA: Insufficient documentation

## 2010-09-19 DIAGNOSIS — R55 Syncope and collapse: Secondary | ICD-10-CM | POA: Insufficient documentation

## 2010-09-19 DIAGNOSIS — G40909 Epilepsy, unspecified, not intractable, without status epilepticus: Secondary | ICD-10-CM | POA: Insufficient documentation

## 2010-09-19 LAB — CBC
MCH: 22 pg — ABNORMAL LOW (ref 26.0–34.0)
MCHC: 33.1 g/dL (ref 30.0–36.0)
Platelets: 242 10*3/uL (ref 150–400)
RDW: 17.4 % — ABNORMAL HIGH (ref 11.5–15.5)

## 2010-09-19 LAB — COMPREHENSIVE METABOLIC PANEL
AST: 40 U/L — ABNORMAL HIGH (ref 0–37)
Albumin: 3.7 g/dL (ref 3.5–5.2)
Calcium: 9 mg/dL (ref 8.4–10.5)
Creatinine, Ser: 0.7 mg/dL (ref 0.4–1.2)
GFR calc Af Amer: >60 mL/min (ref >60)

## 2010-09-20 LAB — URINALYSIS, ROUTINE W REFLEX MICROSCOPIC
Ketones, ur: NEGATIVE mg/dL
Nitrite: NEGATIVE
Specific Gravity, Urine: 1.016 (ref 1.005–1.030)
Urobilinogen, UA: 0.2 mg/dL (ref 0.0–1.0)
pH: 6 (ref 5.0–8.0)

## 2010-09-20 LAB — PROTIME-INR: INR: 0.99 (ref 0.00–1.49)

## 2010-09-20 LAB — POCT CARDIAC MARKERS
Myoglobin, poc: 148 ng/mL (ref 12–200)
Troponin i, poc: 0.06 ng/mL (ref 0.00–0.09)

## 2010-09-20 LAB — DIFFERENTIAL
Basophils Relative: 0 % (ref 0–1)
Eosinophils Absolute: 0 10*3/uL (ref 0.0–0.7)
Lymphs Abs: 1.1 10*3/uL (ref 0.7–4.0)
Monocytes Absolute: 0.5 10*3/uL (ref 0.1–1.0)
Neutro Abs: 6 10*3/uL (ref 1.7–7.7)
Neutrophils Relative %: 79 % — ABNORMAL HIGH (ref 43–77)

## 2010-09-20 LAB — APTT: aPTT: 33 seconds (ref 24–37)

## 2010-09-20 LAB — URINE MICROSCOPIC-ADD ON

## 2010-09-21 LAB — URINE CULTURE

## 2010-10-13 NOTE — Assessment & Plan Note (Signed)
[facility]                                 ON-CALL NOTE   NAME:Melvin, MEYER DOCKERY                      MRN:          [id number]  DATE:09/22/2009                            DOB:          [birth year]    ELECTROPHYSIOLOGIST:  Duke Salvia, MD, [facility]   I received a page from Ledon Snare, who is the daughter of Tayden Duran who is a patient of Dr. Graciela Husbands.  The patient has had recurrent  syncope and had a St. Jude implantable loop recorder placed on August 01, 2008.  Ms. Earlene Plater was calling to inform me that her mother, Hannah Strader, had had another episode of syncope between 12 p.m. and 1 p.m.  today.  This episode of syncope was virtually identical to all her  previous episodes and she just wanted [facility] to be informed.  Interestingly, the patient is in the emergency department at [facility] and is currently awaiting the results of some tests and  called only to keep to update [place].  As at this time, the emergency department has not contacted me.  I will  see the patient and therefore I do not want to interfere and will not go  down until/unless they call for assistance.  Ms. Solan, per her  daughter's report, is currently asymptomatic and hemodynamically stable.     Jarrett Ables, [facility]     MS/MedQ  DD: 09/22/2009  DT: 09/23/2009  Job #: [id number]

## 2010-10-13 NOTE — Consult Note (Signed)
NAMECHAROLETTE, [name]               ACCOUNT NO.:  [id number]   MEDICAL RECORD NO.:  [id number]          PATIENT TYPE:  INP   LOCATION:  1420                         FACILITY:  [facility]   PHYSICIAN:  Melvyn Novas, M.D.  DATE OF BIRTH:  [birth year]   DATE OF CONSULTATION:  07/18/2008  DATE OF DISCHARGE:                                 CONSULTATION   CHIEF COMPLAINT:  Syncope.   HISTORY OF PRESENT ILLNESS:  This is a pleasant African American female  who is a 77 year old, who is followed at [facility]  by Darrol Angel, [facility] for Dr. Orlin Hilding and now seen for Dr. Sandria Manly.  Last  visit was on May 22, 2008, at that time, she was being followed up  for possible seizure/syncopal episodes with seizure activity.    The patient was at that time on Keppra 500 mg b.i.d., however, she  stated that she had not been taking her Keppra as directed- in fact has  only been taking 1 pill a day instead of 2 pills a day and had for at  least one month not taken Keppra at all.  The patient had not had any  breakthrough seizures/syncopal episodes with seizure in approximately 1  year.  Last EEG that was done on March 09, 2005, which was normal.  She was  to follow up in 6 months if not sooner if any problems arise with Mrs.  Daphine Deutscher and Dr. Sandria Manly.  Today's event, the patient was at her [employer] Study on July 17, 2008,  when the patient states that she felt slightly hot flash, passed out,  does not recall standing on the floor; however, actually she hit on the  floor next thing she remembers is that her people from her [employer]  Group standing over her.  EMS had been called at that time.  She does  remember somebody, a fellow was wiping down sweat off her brow.  Bystander states there was no tonic-clonic movement.  No urinary  incontinence.  No tongue biting.  No gasping of breath.  No abnormal  muscle movements.  She is unsure how long this approximately lasted, she  does remember  being fully aware of her surroundings.  Afterwards, she felt neither tired nor in distress, no  muscle achiness  throughout her body.  The patient was not allowed to stand up due the patient's friend worried  about her well being at that time, however, the patient does believe she  could stand up without any difficulty.  The patient was brought to the  emergency room.  At that time, CT scan over head showed no  abnormalities.  The patient was fully lucid and oriented at that time.  It was noted in  the report from ED that although there was no convulsions seen, her  mother's eyes rolled funny in the back of her head.  At the time of interview, the patient is alert and oriented.  She is  having no complaints at this time other than slight discomfort in her  bladder region.  She tested positive for  UTI.  ROS;She is not complaining of any chest pain, shortness breath, muscle  cramps, diplopia, numbness, or tingling.   PAST MEDICAL HISTORY:  1. Hypertension.  2. Dementia.  3. CAD.  4. Seizures.  5. CVA.   HOME MEDICATIONS:  1. Aricept 5 mg daily.  2. Metoprolol 50 mg b.i.d.  3. Keppra 500 mg b.i.d., however, she states she only takes one of the      Keppras' per day because she feels fine.  4. Aspirin 81 mg per day.   ALLERGIES:  She is not allergic to any drugs.  Environmental factors,  however, she does have side effects with TYLENOL No. 3 , which is  headache.   SOCIAL HISTORY:  Does not smoke, drink, or doing illicit drugs.   REVIEW OF SYSTEMS:  All are negative with exception of above.   PHYSICAL EXAMINATION:  VITAL SIGNS:  Blood pressure is 112/61, pulse 57,  respiratory rate 16, and temperature 98.1.  MENTAL STATUS:  She is alert.  She is oriented.  She is aware of her  surroundings.  She is appropriate.  She carries out two and three-step  commands without any difficulty.  CRANIAL NERVES:  Pupils equal, round, reactive, and accommodating to  light.  Pupils are  approximately 3 mm constricting to 2 mm.  Conjugate  gaze.  Extraocular muscles are intact.  Visual fields are grossly  intact.  Face symmetrical.  Tongue is midline.  Uvula is midline.  Sensation throughout the face V1 through V3 is full.  Head shrug and  shoulder turn is full.  COORDINATION:  Finger-to-nose although slightly slow is smooth without  any dysmetria.  Heel-to-shin although again slow and has no dysmetria  and is smooth.  Fine motor movements are within normal limits.  Gait,  the patient is slightly deconditioned so that the gait is slightly  unsteady, however, it is narrow.  She is able to get on her toes and  heels length difficulty.  Motor is 5/5 grossly throughout.  Her upper  and lower extremity, she has no tremor, no clonus, no abnormal leg  movements, slightly decreased bulk, but normal tone.  She does state  that she has some pain being on the medial aspect, bilateral knees, and  no palpable cord and no Homans sign.  Drift is negative at this time.  Sensation is full to pinprick, light touch, vibration throughout.  PULMONARY:  Clear to auscultation.  CARDIOVASCULAR:  S1 and S2.  Regular rate and rhythm.  No murmurs.   EKG that was done while she was in the emergency room showed left  ventricular hypertrophy, right bundle branch block, and one QRS.   Labs at this time, troponin is normal at 0.07.  CK is normal at 91, CK-  MB is 2.2.  Folate and B12 are within normal limits.  UA showed positive  for leukocytes and increased bacteria.  EKG as stated shows right bundle  branch block, left ventricular hypertrophy which again contributes to  syncopal episode.  Sodium is 140, potassium 3.2, chloride is 107, CO2 is  26, BUN is 15, creatinine 0.96, glucose 132, white blood cells 8.8,  platelets 259, hemoglobin/hematocrit 10.1 and 32.0, triglycerides 122,  cholesterol 168, HDL 71, and LDL 73.   IMAGING AND TEST:  The pulmonary shows right ICA has no stenosis, the  left ICA  shows 48-60% stenosis.  However, on the lower end.   ASSESSMENT:  A 2-D echo is pending.  CT of the head was within  normal  limits.   TREATMENT AND PLAN AND RECOMMENDATIONS AT THIS TIME:  1. The patient does have a UTI.  I would recommend treating UTI.  2. Most likely this is a syncopal episode as this does not have any      characteristics of a seizure.  The patient did not have any tonic-      clonic movements.  No urinary incontinence.  No tongue biting.  No gasping for breath.  No  tremor.   Although it should be noted the patient has been noncompliant with  Keppra at this time.  She can discontinue the Keppra as it changed  nothing about her episodes. She did not have any while being off  medicine for a full month.  ZO:XWRU likely these are syncopal episodes with secondary myoclonic  activity.  However, we would recommend an outpatient EEG for 24 hours and recommend  to place  the patient on Effexor XL 37.5  mg daily as a starter dose for  possible psychogenic component .  Again recommended was a 30-day cardiac ambulatory monitoring if not done  by this time- as this is the third time the patient has been to the  hospital for syncopal episode. A tile table test and orthostatic blood  pressures would be needed to clear cardiac/ vasovagal components before  the patient is again referred to neurology.   C.Martin ,GNP and Dr. Sandria Manly   I will discuss these findings and results with Dr. Vickey Huger.     ______________________________  Felicie Morn, PA-C      Melvyn Novas, M.D.  Electronically Signed    DS/MEDQ  D:  07/18/2008  T:  07/19/2008  Job:  [id number]   cc:   Melvyn Novas, M.D.  Fax: [phone]   Osvaldo Shipper. Spruill, M.D.  Fax: [phone]

## 2010-10-13 NOTE — Op Note (Signed)
[name], JOHAL NO.:  [id number]   MEDICAL RECORD NO.:  [id number]          PATIENT TYPE:  OIB   LOCATION:  2899                         FACILITY:  [facility]   PHYSICIAN:  Duke Salvia, MD, FACCDATE OF BIRTH:  1929-07-26   DATE OF PROCEDURE:  08/01/2008  DATE OF DISCHARGE:  08/01/2008                               OPERATIVE REPORT   PREOPERATIVE DIAGNOSIS:  Syncope with right bundle branch block.   POSTOPERATIVE DIAGNOSIS:  Syncope with right bundle branch block.   PROCEDURE:  Implantation of a loop recorder.   Following obtaining informed consent, the patient was brought to the  Electrophysiology Laboratory and placed on the fluoroscopic table in  supine position.  After routine prep and drape of the left upper chest,  the previously mapped site was anesthetized and an incision was made and  carried down to the layer of the prepectoral fascia using  electrocautery.  With sharp dissection, a pocket was formed similarly.  Hemostasis was obtained.   Two 2-0 silk sutures were placed at the cephalad aspect of the pocket  and used to secure a St. Jude, confirm implantable monitor model [id number],  serial number [id number].  The pocket was copiously irrigated with  antibiotic containing saline solution.  Hemostasis was assured.  The  wound was then closed in three layers in a normal fashion.  The wound  was washed, dried and a benzoin and Steri-Strip dressing was applied.  Needle counts, sponge counts, and instrument counts were correct at the  end of the procedure according to the staff.  The patient tolerated the  procedure without apparent complication.      Duke Salvia, MD, [facility]  Electronically Signed     SCK/MEDQ  D:  08/01/2008  T:  08/02/2008  Job:  [id number]

## 2010-10-13 NOTE — Letter (Signed)
July 26, 2008    Osvaldo Shipper. Spruill, M.D.  [address] [address]  [place], [place] [address]   RE:  [name], [name]  MRN:  [id number]  /  DOB:  1930/05/12   Dear [name]:   It was a pleasure seeing [name] today with her daughter regarding  recurrent syncope.   As you know, she is a 77 year old lady recently hospitalized for syncope  who underwent a cardiac evaluation at that time with a normal echo and  had previously normal Myoview scan about 2 years ago.   These episodes are poorly described.  They appear to be rather abrupt in  onset.  According to her daughter, they may last as long as a minute.  [name] was available at one of these episodes and described a  very slow heart rate.  It would be worth getting his input.   She denies prior a history of myocardial infarction.  She has modest  exercise intolerance but no nocturnal dyspnea or orthopnea.   MEDICAL REVIEW OF SYSTEMS:  Notable for GE reflux disease, arthritis,  constipation, urinary problems and is otherwise broadly negative across  multiple organ systems.   PAST SURGICAL HISTORY:  Notable for:  1. Hysterectomy.  2. Appendectomy.   SOCIAL HISTORY:  She is widowed.  She has 3 children.  She does not use  cigarettes, alcohol, or recreational drugs.  She used to work at a  [employer] and then worked for the [employer] family.   CURRENT MEDICATIONS:  Include:  1. Lansoprazole 30 b.i.d.  2. Effexor 37.5 daily.  3. Aricept 5.  4. MiraLax.  5. Aspirin.   SHE IS ALLERGIC TO TYLENOL NO. 1.   EXAMINATION:  Today she is an elderly African American female appearing  her stated age of 77.  Her blood pressure is 131/75 with a pulse of 64  standing at 0 minutes it was 114/69 with a pulse of 73 without any  significant change over the ensuing 5 minutes.  HEENT EXAM:  Demonstrated no icterus or xanthomata.  NECK:  Veins were flat.  The carotids were brisk and full bilaterally  without bruits.  BACK:  Without scoliosis.   There was modest kyphosis.  HEART:  Sounds were regular.  There was an RV lift.  P2 was quite loud  and S2 was widely split.  ABDOMEN:  Soft with active bowel sounds without midline pulsation or  hepatomegaly.  Femoral pulses were 2+.  Distal pulses were intact.  There was no  clubbing, cyanosis, or edema.  NEUROLOGICAL EXAM:  Grossly normal.  SKIN:  Warm and dry.   Electrocardiogram dated today demonstrated sinus rhythm at 64 with  intervals of 0.13/0.14/0.47.  The axis was 71 degrees.  There was an R  prime with a monophasic R-wave in lead V1 and V2.   I reviewed the echo.  There were no descriptions in there of pulmonary  hypertension.   IMPRESSION:  1. Recurrent syncope with 3 or 4 episodes over the last couple years.  2. Conduction system disease manifested by right bundle-branch block      with evidence of right ventricular hypertrophy not confirmed by      echo.  3. Normal left ventricular function.   [name], [name] has recurrent syncope with multiple episodes over a  couple years.  Her conduction system disease would suggest that there is  a moderate likelihood that this would be related to heart block.  In  favor of this is the  abruptness of the onset.  Somewhat against it is  the slowness of the recovery.  Given the prolonged duration of these  episodes and the relative infrequency, implantable loop recording I  think gives [place] our best chance to understand what the mechanism is.  I  have reviewed this with the patient and her daughter.  I have explained  to them why I am not sure that pacing is indicated at this point in the  absence of a diagnosis.  In the  event that we get clear information from [facility] evaluation that  her heart rate was exceedingly slow with a decent size amplitude pulse  it might change my mind.   Thanks very much for asking [place] to see her.    Sincerely,      Duke Salvia, MD, [facility]  Electronically Signed    SCK/MedQ   DD: 07/26/2008  DT: 07/26/2008  Job #: [id number]

## 2010-10-16 NOTE — Cardiovascular Report (Signed)
NAME:  [name], [name]                         ACCOUNT NO.:  [id number]   MEDICAL RECORD NO.:  [id number]                   PATIENT TYPE:  INP   LOCATION:  2929                                 FACILITY:  [facility]   PHYSICIAN:  Eduardo Osier. Sharyn Lull, M.D.              DATE OF BIRTH:  [birth year]   DATE OF PROCEDURE:  01/07/2003  DATE OF DISCHARGE:                              CARDIAC CATHETERIZATION   PROCEDURE:  Cardiac catheterization with selective left and right coronary  angiography, left ventriculography via right groin using Judkins technique.   INDICATIONS FOR PROCEDURE:  [name] is a 77 year old black female with  past medical history significant for coronary artery disease, hypertension,  GERD, arthritis.  Complains of bilateral shoulder and arm pains, hand  tingling, chest pain off and on associated with nausea and mild shortness of  breath for one week.  The patient denies any palpitations, lightheadedness,  or syncope.  Denies PND, orthopnea, leg swelling.  EKG done in the ER showed  normal sinus rhythm with right bundle branch block with second degree ST-T  wave changes.  The patient received one sublingual nitroglycerin with relief  of chest pain in the ER.  The patient denies any cough, fever, chills.  Denies recent catheterization.  Cardiac enzymes done in the ER showed CPK-MB  of 29.6, second set 15.7, third set 16.0.  Troponin I were also mildly  elevated 0.16, 0.14, 0.10 and myoglobin was about 500.  CPK-MB total was  549, MB 16.4 with relative index of 3.0 and troponin I 0.13.  Due to  recurrent chest pain and positive cardiac enzymes and multiple risk factors,  patient was discussed regarding left catheterization, possible PTA,  stenting, its risks, i.e., death, MI, stroke, need for emergency CABG, risk  of restenosis, local vascular complications, etc. and consented for PCI.   PROCEDURE:  After obtaining the informed consent patient was brought to the  catheterization laboratory and was placed on fluoroscopy table.  Right groin  was prepped and draped in usual fashion.  2% Xylocaine was used for local  anesthesia in right groin.  With the help of thin wall needle 7-French  arterial sheath was placed.  The sheath was aspirated and flushed.  Next, 6-  French left Judkins catheter was advanced over the wire under fluoroscopic  guidance up to the ascending aorta where it was pulled out.  The catheter  was aspirated and connected to the manifold.  Catheter was further advanced  and engaged into left coronary ostium.  Multiple views of the left system  were taken.  Next, the catheter was disengaged and was pulled out over the  wire and was replaced with 6-French right Judkins catheter which was  advanced over the wire under fluoroscopic guidance up to the ascending aorta  where it was pulled out.  The catheter was aspirated and connected to the  manifold.  Catheter was further advanced  and engaged into the right coronary  ostium.  Multiple views of the right system were taken.  Next, the catheter  was disengaged and was pulled out over the wire and was replaced with 6-  French pigtail catheter which was advanced over the wire under fluoroscopic  guidance up to the ascending aorta where it was pulled out.  The catheter  was aspirated and connected to the manifold.  Catheter was further advanced  across the aortic valve into the LV.  LV pressures were recorded.  Next, LV  graph was done in 30 degree RAO position.  Post angiographic pressures were  recorded from LV and then pullback pressures recorded from aorta.  There was  no gradient across the aortic valve.  Next, the pigtail catheter was pulled  out over the wire sheath, aspirated, and flushed.   FINDINGS:  LV showed good LV systolic function, LVH with EF of 70%.  Left  main was patent.  LAD has 10-15% proximal and mid stenosis.  Distally at the  apex the vessel was diffusely diseased and  distally at the apex the vessel  tapers down and is diffusely diseased.  Diagonal 1 and diagonal 2 are small  which are patent.  Ramus has  20% proximal stenosis.  The left circumflex is patent.  OM 1 is less than  0.5 mm which is diffusely diseased.  OM 2 is large which is patent.  [name]  has 5-10% mid stenosis.  The patient tolerated procedure well.  There were  no complications.  The patient was transferred to recovery room in stable  condition.                                               Eduardo Osier. Sharyn Lull, M.D.    MNH/MEDQ  D:  01/07/2003  T:  01/07/2003  Job:  [id number]   cc:   Cath Lab   Osvaldo Shipper. Spruill, M.D.  [address] [address]  [place]  [place] [address]  Fax: [phone]

## 2010-10-16 NOTE — Discharge Summary (Signed)
NAME:  [name], [name]                         ACCOUNT NO.:  [id number]   MEDICAL RECORD NO.:  [id number]                   PATIENT TYPE:  INP   LOCATION:  2912                                 FACILITY:  [facility]   PHYSICIAN:  Eduardo Osier. Sharyn Lull, M.D.              DATE OF BIRTH:  02-04-1930   DATE OF ADMISSION:  01/28/2004  DATE OF DISCHARGE:  01/31/2004                                 DISCHARGE SUMMARY   ADMISSION DIAGNOSES:  1.  Probable small non-Q wave myocardial infarction.  2.  Hypertension.  3.  Gastroesophageal reflux disease.  4.  Chronic microcytic anemia.  5.  Degenerative joint disease.  6.  Depression.   DISCHARGE DIAGNOSES:  1.  Status post probable very small non-Q wave myocardial infarction, status      post left catheterization.  2.  Hypertension.  3.  Gastroesophageal reflux disease.  4.  Chronic microcytic anemia, stable.  5.  Degenerative joint disease.  6.  Depression.   DISCHARGE MEDICATIONS:  1.  Toprol XL 50 mg one tablet daily.  2.  Baby aspirin 81 mg one tablet daily.  3.  Protonix 40 mg one tablet daily.  4.  Paxil 12.5 mg one daily.  5.  Feosol 325 mg one tablet twice daily.  6.  Tylenol 650 mg every 6-8 hours as needed.  7.  Nitrostat 0.4 mg sublingual use as directed.   ACTIVITY:  Avoid heavy lifting, pushing or pulling x48 hours.   DIET:  Low salt, low cholesterol diet.   SPECIAL INSTRUCTIONS:  Post cardiac catheterization instructions have been  given.   FOLLOW UP:  Follow up with me in 1 week.  Follow up with Dr. Shana Chute in 2  weeks.   CONDITION ON DISCHARGE:  Stable.   HISTORY OF PRESENT ILLNESS:  Ms. Ocheltree is a 77 year old, African-American  female with past medical history significant for hypertension, GERD,  degenerative joint disease, chronic anemia, depression who came to the  emergency room complaining of retrosternal chest tightness since earlier  this morning rated 8/10.  She took Pepcid with partial relief, but again had  chest tightness, left shoulder and neck pain.  She decided to come to the  ER.  In the ER, she received one sublingual nitroglycerin with relief of  chest tightness.  Neck and shoulder pain also increases with movement.  She  denies any nausea or vomiting, but complains of mild shortness of breath.  The patient denies any palpitations, lightheadedness, syncope, PND,  orthopnea, leg swelling.  The patient gives history of exertional dyspnea  and feeling weak for approximately one month.  The patient presently denies  any chest pain with no shortness of breath.   PAST MEDICAL HISTORY:  As above.   PAST SURGICAL HISTORY:  1.  Appendectomy many years ago.  2.  Hysterectomy.  3.  Status post laser surgery in the past.   SOCIAL HISTORY:  She is  widowed.  Nonsmoker.  No alcohol abuse.  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   FAMILY HISTORY:  Mother died of Alzheimer's disease.  Father died of CVA.   MEDICATIONS:  1.  Toprol XL 50 mg p.o. q.d.  2.  Baby aspirin 81 mg p.o. q.d.  3.  Protonix 40 mg p.o. q.d.  4.  Ultracet p.r.n.  5.  Allegra p.r.n.  6.  Paxil 12.5 mg p.o. q.d.   PHYSICAL EXAMINATION:  GENERAL:  She is alert and oriented x3 in no acute  distress.  VITAL SIGNS:  Blood pressure 128/71, pulse 63 and regular.  HEENT:  Conjunctivae pink.  NECK:  Supple with no JVD or bruit.  LUNGS:  Clear to auscultation bilaterally without rhonchi.  CARDIAC:  S1, S2 normal.  Soft systolic murmur.  ABDOMEN:  Soft, bowel sounds present.  EXTREMITIES:  There is no clubbing, cyanosis or edema.   LABORATORY DATA AND X-RAY FINDINGS:  EKG showed normal sinus rhythm with  right bundle branch block.   Cholesterol 165, LDL 53, HDL 75, triglycerides slightly elevated at 187.  Her CK was 60, MB 2.5; second set with CK 53, MB 2.3.  Troponin I's were  elevated at 0.25 and 0.22.  Sodium 136, potassium 3.2, chloride 105, bicarb  27, blood sugar 142,  BUN 16, creatinine 1.5.  Blood sugar today is 88, BUN  11, creatinine 0.9, potassium 4.3.  Hemoglobin 9.4, hematocrit 28.3, white  count 8.6.   HOSPITAL COURSE:  The patient was admitted to telemetry unit.  The patient  ruled in for a probable very small non-Q wave myocardial infarction due to  typical anginal chest pain and mildly elevated troponin I.  The patient  subsequently underwent left cardiac catheterization on September 1.  As per  procedure report, the patient tolerated the procedure well and there were no  complications.  The patient did not have any further episodes of chest pain  after the catheterization.  Her groin is stable.  There is no evidence of  hematoma or bruit.  The patient has been ambulating without any problems.  Her groin is stable.  The patient will be discharged home on the above  medications and will be followed up in my office in 1 week and with Dr.  Shana Chute in 2 weeks.                                                Eduardo Osier. Sharyn Lull, M.D.    MNH/MEDQ  D:  01/31/2004  T:  02/02/2004  Job:  [id number]

## 2010-10-16 NOTE — Discharge Summary (Signed)
NAME:  [name], [name]                         ACCOUNT NO.:  [id number]   MEDICAL RECORD NO.:  [id number]                   PATIENT TYPE:  INP   LOCATION:  3703                                 FACILITY:  [facility]   PHYSICIAN:  Ricki Rodriguez, M.D.               DATE OF BIRTH:  Mar 15, 1930   DATE OF ADMISSION:  06/01/2002  DATE OF DISCHARGE:  06/04/2002                                 DISCHARGE SUMMARY   PRINCIPAL DIAGNOSES:  1. Noncardiac chest pain.  2. Iron-deficiency anemia.  3. Hypertension.  4. Abnormal cardiac enzymes.   DISCHARGE MEDICATIONS:  1. Ferrous sulfate 325 mg 1 twice daily for one month then 1 daily.  2. Toprol XL 25 mg 1 daily.  3. Protonix 40 mg daily.  4. Multivitamin 1 daily.  5. Potassium 10 mEq 1 on Monday, Wednesday, and Friday.  6. Carafate 1 g four times daily for one month.   PAIN MEDICATIONS:  Tylenol as needed.   DISCHARGE ACTIVITIES:  As tolerated.   DISCHARGE DIET:  Low-fat/low-salt diet.   FOLLOW UP:  Follow up with Dr. Donia Guiles in two weeks.   HISTORY:  This 77 year old black female presented with typical chest pain at  the same time with chest pain increasing with deep breathing.   PAST MEDICAL HISTORY:  Positive for hypertension, cardiac catheterization 6-  8 years ago was unremarkable.   PHYSICAL EXAMINATION:  VITAL SIGNS:  Temperature 98, pulse 69, respirations  18, blood pressure 130/77, height 5 feet 1 inch, weight 150 pounds.  GENERAL:  The patient was alert and oriented x3.  Oxygen saturation was 90%.  HEENT:  Head:  Normocephalic, atraumatic.  Eyes:  Brown, pupils 1 mm  reacting to light.  Ears, nose, throat:  Mucous membrane pink and moist.  NECK:  No JVD.  LUNGS:  Clear.  HEART:  S1, S2 normal.  ABDOMEN:  Soft.  EXTREMITIES:  No edema, cyanosis, or clubbing.  CENTRAL NERVOUS SYSTEM:  Cranial nerves II-XII grossly intact.   LABORATORY DATA:  Laboratory data revealed a hemoglobin of 10.4, hematocrit  of 32.9, normal  WBC count, normal platelet count.  Normal retic count.  Normal PT/PTT.  Normal electrolytes after potassium supplement.  On  admission, potassium was 3.3, sodium 134.  CK elevated at 685 with MB of  8.2.  Original admission CK was 470 with an MB of 5.0.  Troponin-I was 0.05  initially and 0.04 subsequently.   EKG revealed sinus rhythm with right bundle branch block and nonspecific T-  wave changes.  Nuclear stress test was negative for any ischemia and had a  hyperdynamic wall motion.   Serum iron was low at 40.   A CT of the abdomen was unremarkable.   HOSPITAL COURSE:  The patient was admitted to telemetry unit.  Myocardial  infarction was questionable; however, her nuclear stress test was negative  for reversible ischemia and her ejection  fraction of 80%.  The patient had a  low hemoglobin which on investigation showed low iron level and CT of the  abdomen was negative for any active disease.  Hence, the patient was  discharged home with iron supplement, Protonix, Carafate slurry, potassium  replacement three times a week and home medications of Toprol XL,  multivitamin, and additional GI workup on an outpatient basis.  Her  difficulty swallowing may resolve with iron therapy.  If not, she may need  an ENT referral also in the future.                                               Ricki Rodriguez, M.D.    ASK/MEDQ  D:  06/04/2002  T:  06/04/2002  Job:  [id number]

## 2010-10-16 NOTE — Discharge Summary (Signed)
NAME:  [name], [name]                         ACCOUNT NO.:  [id number]   MEDICAL RECORD NO.:  [id number]                   PATIENT TYPE:  INP   LOCATION:  3703                                 FACILITY:  [facility]   PHYSICIAN:  Osvaldo Shipper. Spruill, M.D.             DATE OF BIRTH:  02/04/1930   DATE OF ADMISSION:  01/06/2003  DATE OF DISCHARGE:  01/11/2003                                 DISCHARGE SUMMARY   DISCHARGE DIAGNOSES:  1. Non-Q-wave myocardial infarction.  2. Hematuria.  3. Coronary atherosclerosis of native coronary vessels.  4. Hypertension.  5. Esophageal reflux.  6. Pain in limbs.  7. Iron-deficiency anemia.   CONSULTATIONS:  Mohan N. Sharyn Lull, M.D.   PROCEDURE:  Cardiac catheterization and injection of platelet inhibitor,  January 07, 2003.   Ms. Torti is a 77 year old patient who presented to the emergency  department at [facility] with a complaint of chest pain  with radiation into her left arm, associated with shortness of breath and  nausea. The patient described a mild pain that radiated down the left arm.  It had been going on for approximately eight days prior to admission, but  became more severe and included shortness of breath and even some dizziness.  The patient was evaluated in the emergency department and was noted to have  elevated cardiac markers with a myoglobin of greater than 500, CK-MB of  29.6, and troponin of 0.16.  The patient was subsequently admitted for  evaluation and treatment of this particular problem.   HOSPITAL COURSE:  The patient was admitted to the medical services, placed  on telemetry, placed on IV fluids. She was placed on a nitroglycerin drip as  well as heparin by pharmacy protocol, Lopressor, and Protonix.  The patient  continued to have pain and was taken to the cardiac catheterization  laboratory.  The cardiac catheterization revealed good left ventricular  systolic function with an ejection fraction of 70%,  left main artery was  patent, left anterior descending had a 10% to 15% proximal and mid stenosis,  first and second diagonal were small but patent, and the ramus had a 20%  stenosis. The left circumflex was patent. The first obtuse marginal was less  than a 0.5 mm vessel and diffusely diseased. The second obtuse marginal was  large and patent. The ostia had a 5% to 10% mid stenosis. The patient  tolerated the procedure well and there were no complications during the  procedure.   During the course of the workup the patient was noted to have anemia  present, also some calf tenderness.  Hematology consultation was obtained  and it was noted that the patient had a chronic microcytic anemia with a  hemoglobin of 10.6 and MCV of 68.3 in June 2002.  It was felt that the blood  smear was not consistent with iron deficiency and there was concern as to  whether  the patient could have a hemoglobinopathy, thalassemia, or an  infiltrative bone marrow process.  Plans were made for the patient to be on  iron 325 mg t.i.d. with followup complete blood counts in two to three  weeks. Hemoglobin electrophoresis after the iron.  Plans were made for an  outpatient followup in the cancer center in two to three weeks by Dr.  Truett Perna. The patient's calf pain was evaluated and there was no evidence of  deep venous thrombosis noted.   On January 11, 2003, the patient seemed to be improved. The patient was  placed on iron 325 mg t.i.d., Nasonex one spray each nostril daily, Allegra  D q.12h., Percocet q.6h., Toprol XL 50 one-half daily, and enteric-coated  aspirin 81 mg daily.  The patient is to follow up with hematology at the  cancer center concerning the anemia and is to be seen by Dr. Shana Chute in one  week for follow up of the non-Q-wave MI.      Ivery Quale, P.A.                       Osvaldo Shipper. Spruill, M.D.    HB/MEDQ  D:  04/09/2003  T:  04/10/2003  Job:  [id number]

## 2010-10-16 NOTE — Op Note (Signed)
NAME:  [name], [name]                         ACCOUNT NO.:  [id number]   MEDICAL RECORD NO.:  [id number]                   PATIENT TYPE:  INP   LOCATION:  5155                                 FACILITY:  [facility]   PHYSICIAN:  Georgiana Spinner, M.D.                 DATE OF BIRTH:  Oct 05, 1929   DATE OF PROCEDURE:  DATE OF DISCHARGE:  05/18/2003                                 OPERATIVE REPORT   PROCEDURE:  Colonoscopy.   ENDOSCOPIST:  Georgiana Spinner, M.D.   ANESTHESIA:  Demerol 30, Versed 3 mg.   INDICATIONS:  GI bleed.   DESCRIPTION OF PROCEDURE:  With the patient mildly sedated in the left  lateral decubitus position, the Olympus videoscopic colonoscope was inserted  into the rectum and passed under direct vision to the cecum; identified by  ileocecal valve and appendiceal orifice.  We entered into the terminal ileum  which also appeared normal.   From this point the colonoscope was slowly withdrawn taking circumferential  views of the colonic mucosa; stopping in the sigmoid colon where mild  erosions were seen.  These were photographed and biopsied.  There were  diverticula seen in this area and the impression was that of possibly mild  diverticulitis, subclinical.  The endoscope was then withdrawn all the way  to the rectum which appeared normal.  The rectum showed hemorrhoids on  retroflex view.  The endoscope was straightened and withdrawn.  The  patient's vital signs and pulse oximetry remained stable.  The patient  tolerated the procedure well without apparent complications.   FINDINGS:  Erosions of sigmoid colon that were biopsied in an area of  diverticulosis; await biopsy report, but these appear to be very mild.  Additionally there was a large internal hemorrhoid seen; and, otherwise,  this was an unremarkable colonoscopic examination including the terminal  ileum.   PLAN:  1. Await biopsy report.  2. Advance patient's diet.  3. Observe clinically.                                 Georgiana Spinner, M.D.    GMO/MEDQ  D:  05/17/2003  T:  05/19/2003  Job:  [id number]

## 2010-10-16 NOTE — Discharge Summary (Signed)
NAME:  [name], [name]                         ACCOUNT NO.:  [id number]   MEDICAL RECORD NO.:  [id number]                   PATIENT TYPE:  INP   LOCATION:  5155                                 FACILITY:  [facility]   PHYSICIAN:  Hettie Holstein, D.O.                 DATE OF BIRTH:  05/29/1930   DATE OF ADMISSION:  05/16/2003  DATE OF DISCHARGE:  05/18/2003                                 DISCHARGE SUMMARY   PHYSICIANS:  1. Patient's primary care physician is Dr. Shana Chute as well as [facility]     for medications.  2. Rheumatologist is Dr. Phylliss Bob.  3. Hematologist is Dr. Truett Perna.  4. Cardiologist is Dr. Shana Chute.  5. Gastroenterologist is Dr. Virginia Rochester.   ADMITTING DIAGNOSES:  Direct admission from rheumatologist's office for  hemoccult-positive stools and anemia.   DISCHARGE DIAGNOSES:  1. Chronic anemia, non-iron deficient, microcytic, suspected thalassemia     with recent evaluation by hematologist, Dr. Truett Perna, who performed bone     marrow biopsy revealing abundant iron stores and a suspected diagnosis of     monoclonal gammopathy of undetermined significance based on a bone marrow     biopsy as well as a monoclonal spike on immunoelectrophoresis.  She is     currently pending hemoglobin electrophoresis that was drawn this     admission.  She was transfused 2 units of blood because she had a     hemoglobin of 8.3.  She tolerated this well.  2. Hemoccult-positive stools.  She underwent panendoscopy this admission per     Dr. Virginia Rochester which was negative from above and revealed some mild erosions of     the sigmoid colon, diverticulosis and hemorrhoids with recommendations     for capsule endoscopy at a later date if her Hemoccult-positive stools     persist on an outpatient basis, recommending follow up of her hemoglobin     and hematocrit within the next couple of weeks.   OTHER DIAGNOSES INCLUDE:  1. Gastroesophageal reflux disease.  2. Monoclonal gammopathy of undetermined significance.  3. Status post appendectomy and hysterectomy.  4. She has had multiple colonoscopies over the past 5 years with Dr. Danise Edge in 2002.  5. She has had cardiac catheterization in August 2004 revealing minimal     coronary artery disease with patent left main, left anterior descending,     and left circumflex.  She had an ejection fraction of 70%.  6. She has a history of hypertension.  7. She has a history of chronic microcytic anemia.  Please refer to above.  8. She has a history of nonspecified connective tissue disease with elevated     inflammatory markers including her sed rate and CRP with this admission     having a CRP of 15 and a sed rate of 62.  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 Phylliss Bob with     prednisone therapy.  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   MEDICATIONS ON DISCHARGE:  These were unchanged from of admission which  include:  Prednisone 10 mg p.o. once daily.  Toprol XL 25 mg p.o. once  daily.  Protonix 40 mg p.o. once daily.  She was instructed to stop iron  supplements secondary to her elevated ferritin level and increased iron  stores on bone marrow biopsy as well as iron saturation of 50, her ferritin  this admission was 4663 although this may be elevated as an acute phase  reaction, the rest of her iron studies are suggestive of abundant iron  stores.  Docusate 100 mg p.o. at bedtime.  No new medications were added  this admission.   HISTORY OF PRESENTING ILLNESS:  This is a pleasant 77 year old African-  American female who was being evaluated by her rheumatologist, Dr. Phylliss Bob, in  his office, and was found to have Hemoccult-positive stools with a low  hemoglobin and hematocrit -   microcytic.  It was felt that she should be  evaluated as an inpatient and undergo upper and lower endoscopy.  Dr. Virginia Rochester  was notified she was admitted, found to have again a hemoglobin of 8.3, she  had transfusions several weeks previously, iron studies were performed, she  had some complaints of intermittent abdominal discomfort and nausea which  has resolved this admission, she was hemodynamically stable without  complaints upon arrival.   HOSPITAL COURSE:  Patient was admitted, she was started on a bowel  preparation with anticipation of upper and lower endoscopy per Dr. Virginia Rochester, she  was typed and crossed, transfused a couple of units, she was stable, she  continued on her home medications, she continued on steroids and routine lab  studies were ordered including a hemoglobin and hematocrit and chemistries,  thyroid studies were also ordered.  In review of the medical records that  became available it was noted that she had abundant iron stores on her bone  marrow biopsy, no evidence of a malignancy was noted, and it was suggested  in review of her immunoelectrophoresis that she had MGUS, her renal function  remained stable, no evidence of renal failure this admission, and Dr.  Truett Perna was notified and it was recommended that she have followup with him  frequently every 4 months to monitor her for transformation of her MGUS.  At  this time she is ambulatory, stable, requesting to be released.  She has  long time planned a trip to [place] for the holidays.  At this time I am going  to let her go.  She wants to have a copy of this summary of her medical  history that she can take along in case something were to happen.  Also, in  review of her records, I do know that her last imaging study of the thorax,  abdomen and pelvis was performed 1 year ago with benign findings as well as  a renal ultrasound in August.   DISPOSITION:  She is being instructed to follow up with Dr. Truett Perna after the  holidays as well as her primary care physician, Dr. Shana Chute.  She should  call them when she  gets back from her trip.  She was informed that she could  always call her to the hospital and ask for Dr. Angelena Sole if she has any  problems while she is on her trip in [place].  The numbers to the hospital are  [phone], my pager is [phone] or [phone] and the office number  are all included on this discharge sheet.                                                Hettie Holstein, D.O.    ESS/MEDQ  D:  05/18/2003  T:  05/18/2003  Job:  [id number]   cc:   Osvaldo Shipper. Spruill, M.D.  [address] [address]  [place]  [place] [address]  Fax: [phone]   Areatha Keas, M.D.  [address]  [address]  [place]  [place] [address]  Fax: [phone]   Leighton Roach. Truett Perna, M.D.  [address] [facility]  [place]  [place] [address]  Fax: [phone]   Georgiana Spinner, M.D.  [address]  [place]  [place] [address]  Fax: [phone]

## 2010-10-16 NOTE — Cardiovascular Report (Signed)
NAME:  TAMBRA, MULLER                         ACCOUNT NO.:  [id number]   MEDICAL RECORD NO.:  [id number]                   PATIENT TYPE:  INP   LOCATION:  2912                                 FACILITY:  [facility]   PHYSICIAN:  Eduardo Osier. Sharyn Lull, M.D.              DATE OF BIRTH:  13-Oct-1929   DATE OF PROCEDURE:  01/30/2004  DATE OF DISCHARGE:                              CARDIAC CATHETERIZATION   PROCEDURE:  Left cardiac catheterization with selective left and right  coronary angiography and left ventriculography via the right groin using  Judkins technique.   INDICATIONS:  Ms. Corcino is a 77 year old black female with past medical  history significant for hypertension, gastroesophageal reflux disease,  degenerative joint disease, chronic anemia, depression.  She came to the  emergency room complaining of retrosternal chest tightness since earlier  this a.m., graft 8/10.  Took Pepcid with partial relief, but again, had  chest tightness with left shoulder and neck pain, so decided to come to the  emergency room.  The patient received one sublingual nitro in the emergency  room with relief of chest pain.  States neck and shoulder pain also increase  with movement.  Denies any nausea, vomiting, but complains of mild shortness  of breath.  Denies palpitation, lightheadedness or syncope.  Denies PND,  orthopnea, leg swelling.  The patient gives history of exertional dyspnea  and feeling weak and tired for approximately the last one month.  The  patient presently denies chest pain.  The patient was noted to have mildly  elevated troponin I.  EKG done in the emergency room showed normal sinus  rhythm with right bundle branch block and secondary ST-T wave changes.   PAST MEDICAL HISTORY:  As above.   PAST SURGICAL HISTORY:  She had appendectomy and hysterectomy in the past  and has laser surgery in the past.   SOCIAL HISTORY:  She is widowed.  No history of smoking or tobacco abuse.  She is  retired.  Worked in [employer] in the past, now works as Comptroller part  time.   FAMILY HISTORY:  Father died of stroke at age of 75.  Mother died of  Alzheimer's disease at age of 75.   MEDICATIONS AT HOME:  1.  Toprol XL 50 mg p.o. daily.  2.  Baby aspirin 81 mg p.o. daily.  3.  Protonix 40 mg p.o. daily.  4.  Ultracet p.r.n.  5.  Paxil 12.5 mg p.o. daily.   PHYSICAL EXAMINATION:  GENERAL:  She was alert, awake, oriented x3 in no  acute distress.  VITAL SIGNS:  Blood pressure 128/71, pulse 63.  HEENT:  Conjunctivae pink.  NECK:  Supple.  No jugular venous distention.  No bruit.  LUNGS:  Clear to auscultation without rhonchi or rales.  CARDIOVASCULAR:  S1, S2 was normal.  There was soft systolic murmur.  ABDOMEN:  Soft.  Bowel sounds are present.  Nontender.  EXTREMITIES:  No clubbing, cyanosis or edema.   The patient was admitted to stepdown unit.  The patient ruled in for  probable small non-Q-wave myocardial infarction due to elevated troponin I  which were 0.21, 0.25, 0.23 although CPKs were normal.  Due to typical chest  tightness, multiple risk factors and mildly elevated troponin I, discussed  with patient regarding left cath, possible percutaneous transluminal  coronary angioplasty and stenting, its risks; i.e., death, myocardial  infarction, stroke, need for emergency coronary artery bypass graft, risk of  restenosis, local vascular complications, etc.,  and consent for the  procedure.   PROCEDURE:  After obtaining informed consent, the patient was brought to the  cath lab and was placed on fluoroscopy table. Right groin was prepped and  draped in usual fashion.  2% Xylocaine was used for local anesthesia in the  right groin.  With the help of thin-wall needle, a 6-French arterial sheath  was placed.  The sheath was aspirated and flushed.  Next, 6-French left  Judkins catheter was advanced over the wire under fluoroscopic guidance up  to the ascending aorta.  Wire was  pulled out and the catheter was aspirated  and connected to the manifold. Catheter was further advanced and engaged  into left coronary ostium.  Multiple views of the left system were taken.  Next, the catheter was disengaged and was pulled out over the wire and was  replaced with 6-French right Judkins catheter which was advanced over the  wire under fluoroscopic guidance to the ascending aorta.  Wire was pulled  out, the catheter was aspirated and connected to the manifold.  Catheter was  further advanced and engaged into right coronary ostium.  Multiple views of  the right system were taken.  Next, the catheter was disengaged and was  pulled out over the wire and was replaced with 6-French pigtail catheter  which was advanced over wire under fluoroscopic guidance up to the ascending  aorta.  Wire was pulled out, the catheter was aspirated and connected to the  manifold.  Catheter was further advanced across the aortic valve into the  left ventricle.  Left ventricular pressures were recorded.  Next, left  ventriculography was done in 30-degree RAO position.  Post angiographic  pressures were recorded from LV and then pullback pressures were recorded  from the aorta.  There was no gradient across the aortic valve.  Next, the  pigtail catheter was pulled out over the wire, sheaths aspirated and  flushed.   FINDINGS:  The LV showed good LV systolic function, LVH, EF of 59-56%.  Left  main was patent.  LAD had 10-15% proximal and mid stenosis.  Diagonal one  and two were small which were patent.  Ramus has 20% ostial and 20-30% mid  stenosis.  Left circumflex has 15-20% ostial stenosis.  OM-1 was very, very  small.  OM-2 was moderate size which was patent which bifurcates into  superior and inferior branch which were patent.  RCA has 10-15% proximal and  mid stenosis.  The patient tolerated the procedure well.  There were no complications.  The patient was transferred to recovery room in  stable  condition.                                               Eduardo Osier. Sharyn Lull, M.D.    MNH/MEDQ  D:  01/30/2004  T:  01/31/2004  Job:  [id number]

## 2010-10-16 NOTE — Op Note (Signed)
NAME:  [name], [name]                         ACCOUNT NO.:  [id number]   MEDICAL RECORD NO.:  [id number]                   PATIENT TYPE:  INP   LOCATION:  5155                                 FACILITY:  [facility]   PHYSICIAN:  Georgiana Spinner, M.D.                 DATE OF BIRTH:  [birth year]   DATE OF PROCEDURE:  DATE OF DISCHARGE:  05/18/2003                                 OPERATIVE REPORT   PROCEDURE:  Upper endoscopy.   ENDOSCOPIST:  Georgiana Spinner, M.D.   INDICATIONS:  Hemoccult positivity.   ANESTHESIA:  Demerol 40, Versed 4 mg.   DESCRIPTION OF PROCEDURE:  With the patient mildly sedated in the left  lateral decubitus position, the Olympus videoscopic endoscope was inserted  into the mouth and passed under direct vision through the esophagus which  appeared normal, into the stomach, fundus, body antrum, duodenal bulb, and  second portion of the duodenum all appeared normal.   From this point the endoscope was slowly withdrawn taking circumferential  views of the duodenal mucosa; until the endoscope then pulled back into the  stomach and placed in retroflexion to view the stomach from below.  The  endoscope was straightened and withdrawn taking circumferential views of the  remaining gastric and esophageal mucosa.  The patient's vital signs and  pulse oximetry remained stable.  The patient tolerated the procedure well  without apparent complications.   FINDINGS:  Essentially negative endoscopic examination.   PLAN:  Proceed to colonoscopy.                                               Georgiana Spinner, M.D.    GMO/MEDQ  D:  05/17/2003  T:  05/19/2003  Job:  [id number]

## 2010-10-16 NOTE — Op Note (Signed)
NAME:  [name], [name]                         ACCOUNT NO.:  [id number]   MEDICAL RECORD NO.:  [id number]                   PATIENT TYPE:  AMB   LOCATION:  DSC                                  FACILITY:  [facility]   PHYSICIAN:  Adolph Pollack, M.D.            DATE OF BIRTH:  [birth year]   DATE OF PROCEDURE:  08/02/2003  DATE OF DISCHARGE:                                 OPERATIVE REPORT   PREOPERATIVE DIAGNOSIS:  Lymphadenopathy.   POSTOPERATIVE DIAGNOSIS:  Lymphadenopathy.   PROCEDURE:  Deep left axillary lymph node biopsy x 2.   SURGEON:  Adolph Pollack, M.D.   ANESTHESIA:  Local (1% lidocaine with epinephrine plus 0.5% Marcaine plus  sodium bicarbonate) with MAC.   INDICATION:  [name] is a 77 year old female with new onset anemia and a  truncal rash that is ill-defined.  She is also noted to have  lymphadenopathy.  I have been asked to biopsy a lymph node to see if she has  some sort lymphoproliferative process that may be leading to her rash and  anemia.  She now presents for that.  The procedure and the risks were  discussed with her preoperatively.   TECHNIQUE:  She was seen in the holding area, and the left axillary area  marked with my initials.  She was then brought to the operating room, placed  supine on a table, and given intravenous sedation.  The left axilla was  sterilely prepped and draped.  Local anesthetic was infiltrated  superficially and deep in the left axilla, and a curvilinear incision was  made in the left axilla, carrying this to the subcutaneous tissue.  Using  cautery and blunt dissection, I identified one lymph node, grasped it with  the Babcock forceps, and excised it.  The lymphatics were cauterized and  ligated with Vicryl suture.  Second lymph node was then palpable and able to  be delivered up into the wound bluntly and excised using electrocautery.  Both of these lymph nodes were sent fresh to pathology.   The wound was inspected, and  hemostasis was noted to be adequate.  More  local anesthetic was infiltrated deep into the wound.   The wound was then closed in two layers, closing the subcutaneous tissue  with a running 0 Vicryl suture and closing the skin with a 4-0 Monocryl  subcuticular stitch.  Steri-Strips and a sterile dressing were applied.   She tolerated the procedure well without any apparent complications and was  taken to the recovery room in satisfactory condition.  Discharge  instructions will be given to her as well as some mild analgesic medicine.                                               Adolph Pollack, M.D.  TJR/MEDQ  D:  08/02/2003  T:  08/03/2003  Job:  [id number]   cc:   Leighton Roach. Truett Perna, M.D.  [address] [facility]  [place]  [place] [address]  Fax: [phone]

## 2010-10-16 NOTE — Discharge Summary (Signed)
[facility]  Patient:    [name], [name]                      MRN: [id number] Adm. Date:  [id number] Disc. Date: 11/21/00 Attending:  Ricki Rodriguez CC:         [name], M.D.   Discharge Summary  DISCHARGE DIAGNOSES: 1. Angina. 2. Abdominal pain. 3. Vasovagal syncope. 4. Hypertension.  DISCHARGE MEDICATIONS: 1. Allegra 60 mg one twice daily. 2. Antihypertensive medications as directed. 3. Nexium 40 mg daily. 4. Aspirin 325 mg daily. 5. Meclizine 12.5 mg twice daily. 6. Vicodin one twice daily as needed.  ACTIVITY:  As tolerated.  DIET:  Low fat, low salt diet.  FOLLOWUP:  Follow-up appointment with Dr. Donia Guiles in two weeks.  The patient is to call [phone], for appointment.  HISTORY OF PRESENT ILLNESS:  This 77 year old, African-American female had substernal chest pain radiating to her left shoulder along with some sweating and shortness of breath.  The patient also complained of some epigastric tenderness.  She had no history of diabetes, smoking, alcohol intake, elevated cholesterol level or prior myocardial infarction.  PHYSICAL EXAMINATION:  VITAL SIGNS:  Temperature 98, pulse 59, respiratory rate 18, blood pressure 127/72, height 5 feet 2 inches, weight 157 pounds.  GENERAL:  The patient was alert and oriented x 3.  HEENT:  Head normocephalic with mild bump on head from old injury in the forehead area.  Eyes brown, pupils are equal, round and reactive to light, extraocular movements intact.  Conjunctivae pink, sclerae nonicteric.  Ears, nose, throat revealed mucous membrane pink and moist.  The patient wore dentures.  NECK:  No JVD or carotid bruits.  LUNGS:  Clear.  HEART:  Normal S1, S2 with a grade 2/6 systolic murmur in the left sternal border.  ABDOMEN:  Soft with epigastric tenderness.  EXTREMITIES:  No clubbing, cyanosis, or edema.  NEUROLOGIC:  Cranial nerves II-XII intact with bilateral  equal grips.  LABORATORY DATA AND X-RAY FINDINGS:  Normal WBC, platelets with hemoglobin low at 10.6, hematocrit 33.  Sodium 138, potassium 4, BUN 16, creatinine 0.9, glucose 101.  EKG revealed sinus rhythm with right bundle branch block.  Chest x-ray with no active process.  Lipase 15, amylase 106, CK 226, MB 1.6. Nuclear stress test was negative for ischemia.  Her CK-MB and her troponin I were negative x 3.  TSH was 1.032.  HOSPITAL COURSE:  The patient was admitted to telemetry unit where myocardial infarction was ruled out.  She was given Protonix for her abdominal pain.  She had occult blood checked for anemia which was negative.  Her iron studies were unremarkable with total iron of 91 and saturation of 33%.  She had dizziness and also complained of constipation for which she was given Dulcolax and Antivert prior to undergoing Adenosine Cardiolite Stress Test.  The patient had additional syncope after straining for a bowel movement.  Her nuclear stress test was negative for ischemia.  Her medications were adjusted for her syncopal episodes.  Upper GI study was deferred due to positive response to Protonix.  This could be done on an outpatient basis.  The patient did not have any additional syncopal episode which was triggered by vasovagal condition.  If the symptoms persist, the patient could be considered for permanent pacemaker placement.  The patient was discharged home on November 21, 2000, in satisfactory condition with followup by primary care physician, Dr. Kevin Fenton  [name] in two weeks. DD:  11/23/00 TD:  11/24/00 Job: [id number]

## 2010-10-16 NOTE — H&P (Signed)
NAME:  [name], [name]                         ACCOUNT NO.:  [id number]   MEDICAL RECORD NO.:  [id number]                   PATIENT TYPE:  INP   LOCATION:  5155                                 FACILITY:  [facility]   PHYSICIAN:  Hettie Holstein, D.O.                 DATE OF BIRTH:  09-17-1929   DATE OF ADMISSION:  05/16/2003  DATE OF DISCHARGE:                                HISTORY & PHYSICAL   PRIMARY CARE PHYSICIAN:  1. Dr. Shana Chute.  2. The patient does go to [facility], but she primarily sees Dr. Shana Chute.   RHEUMATOLOGIST:  Dr. Areatha Keas.   CHIEF COMPLAINT:  The patient was admitted directly from her primary-care  physician's office for a finding of heme-positive stools and anemia.   HISTORY OF PRESENT ILLNESS:  This is a pleasant 77 year old African American  female with a history of rheumatoid arthritis, hypertension, coronary artery  disease, chronic microcytic anemia, felt to be secondary to thalassemia  trait by recent evaluation of Dr. Jillyn Hidden B. Sherrill of hematology who was  evaluated in her rheumatologist's office today and found to have heme-  positive stool and reports of two days of intermittent abdominal pain  awakening her from sleep, stating that these last about one hour, diffusely  over abdomen, described as a crampy pain.  It is associated with nausea,  vomiting and diarrhea.  She denies any bloody emesis or coffee ground emesis  and the change in coloration of her stools.  She states that this lasted for  about one hour, and she felt as though she was about to pass out.  She lives  alone.  She used ammonia to prevent this.  She stated that she is not  experienced fevers, but she has noticed some chills at home.  Therefore,  after evaluation by her rheumatologist, admission to the hospital was  coordinated, and she was admitted directly to the floor in stable condition.   PAST MEDICAL HISTORY:  She has a history of gastroesophageal reflux disease.  She has  undergone screening colonoscopy by Dr. Danise Edge in October,  2003, which at that time revealed no endoscopic signs of diverticulitis or  diverticular stricture.  She underwent cardiac catheterization January 07, 2003, by Dr. Eduardo Osier. Harwani which revealed good LV systolic function with  an EF of 70%. The left main was patent.  The LAD had 10 to 15 proximal and  mid stenosis.  The left circumflex was patent.  History of normal upper  endoscopy in 1995 with Dr. Danise Edge.  As noted above, chronic  microcytic anemia evaluated by Dr. Jillyn Hidden B. Sherrill who recently performed a  bone marrow biopsy on April 05, 2003.  By the records, there is no  evidence of malignancy based on that study.  She has a monoclonal gammopathy  of undetermined significance.  Unspecified connective tissue disorder.   SURGICAL HISTORY:  Significant for appendectomy, hysterectomy, EGD in 1995,  colonoscopy in 2002 by Dr. Danise Edge.  She has undergone cardiac  catheterization in August of 2004.   MEDICATIONS AT PRESENT:  1. Docusate 10 mg q.h.s.  2. Ferrous sulfate 325 mg p.o. b.i.d.  3. Protonix 40 mg p.o. daily.  4. Toprol XL 25 mg p.o. daily.  5. Penicillin 10 mg p.o. daily.   REVIEW OF SYSTEMS:  The patient has reports some chills, no fevers or night  sweats.  Nausea and vomiting as described above.  None today.  She reports  about a five-pound weight loss for the past week, decreased p.o. intake.  She denies chest pain or shortness of breath.  She does report abdominal  pain as described in the history of presenting illness.  She reports chronic  dark stools.  She does report some pains in her joints in regard to her  rheumatoid arthritis, including her hands and her feet.  She denies any  swelling.  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   FAMILY HISTORY:  The  patient states that her mother died at the age of 51.  She had had some heart problems.  Her father had cerebrovascular disease.  There is no cancer in the family.   SOCIAL HISTORY:  She currently lives alone.  She was planning to go to [place]  on Sunday to spend the Holidays with her family.  Her husband is deceased.  She is retired.  She used to make shades for windows.  She quit tobacco  greater than 34 years ago.  She denies any alcohol.  She has a small pet at  home.  She does have children who live close by.   PHYSICAL EXAMINATION:  VITAL SIGNS:  Blood pressure 160/70, heart rate 70.  She was afebrile.  GENERAL:  She was alert, oriented, pleasant, in no acute distress, quite  active, ambulating in the room.  She appeared to have normal gait, slightly  kyphotic.  She did have a rash on her anterior chest, anterior arms and  between mid scapular region of her back as well.  This was slightly  hypertrophied and hyperkeratotic with some hyperpigmentation.  There were no  discrete lesions.  She described this as pleuritic.  There were no apparent  lesions on her abdomen or her lower extremities.  There was no apparent  shawl-like distribution classically with regard to this rash.  HEENT:  Normocephalic, atraumatic.  Extraocular muscles intact.  Sclerae  were anicteric.  Conjunctivae were pale.  Oral mucosa was moist and pink.  NECK:  Supple, nontender.  No palpable anterior or cervical lymphadenopathy.  No posterior cervical lymphadenopathy to palpation.  No thyromegaly.  CHEST:  Clear to auscultation.  PMI was nondisplaced.  S1 and S2 were  regular without audible murmur.  No appreciable carotid bruits was noted as  well.  ABDOMEN:  Soft, nontender.  No palpable hepatosplenomegaly, no palpable  tenderness.  Mild suprapubic tenderness.  No CVA tenderness was noted.  EXTREMITIES:  The lower extremities exhibited no calf tenderness, no edema. Peripheral pulses were good.  Strength was  symmetrical.  Proximal muscles as  well as distal muscles were intact with regard to strength.   LABORATORY DATA:  As provided by Dr. Renaldo Reel clinic, WBC count 12.8,  hemoglobin of 9.2, hematocrit of 28.2, MCV of 63.2.  Platelet count 551.  MCH of 20.7.  Sodium 134.  Potassium 2.9.  Chloride 105.  CO2 27.  BUN 13,  creatinine 0.7.  Glucose 96.  UA revealed trace leukocyte esterase, trace  blood.   Laboratory from the office, there was noted an elevated CRP as well as an  elevated sed rate.  Anti-assay was positive.  There was some monoclonal  gammopathy reported on the S pep studies.   IMPRESSION:  1. Heme-positive stools with microcytic anemia.  The bone marrow results are     available, and I see that she does have abundant iron stores, and she     must have anemia secondary to thalassemia trait, in addition to anemia of     chronic disease.  2. Nausea and vomiting.  3. Intermittent abdominal pain.  4. Hypertension.  5. Coronary artery disease.  6. UCP disease.  7. Urinary tract infection.  8. Weight loss.  9. Failure to thrive.   PLAN:  At this time, at the request of Dr. Renaldo Reel office, we have asked Dr.  Virginia Rochester to evaluate the patient in regard to her GI symptoms and heme-positive  stools.  In view of the medical records, it was suggested in the past that  she may have esophageal spasm.  However, Her clinical description of her  recent symptoms did not seem consistent with esophageal spasm.  In any  event, further evaluation for endoscopy would be in order here.  I am going  to as well replete her electrolyte abnormality and follow her here closely.  We are going to do a UA, C&S and treat her urinary tract infection if our  study does indicate such.  We are going to continue her medications that she  has been home at home.  If nothing is found endoscopically, we will discuss  further evaluation with her outpatient physician.  Perhaps this may need to  be pursued aggressively in  an outpatient setting.  At present, I have  discussed this case with Dr. Virginia Rochester, and he is planning to perform upper and  lower endoscopy in the morning.  She will undergo bowel preparation and  remain on a clear liquid diet.                                                Hettie Holstein, D.O.    ESS/MEDQ  D:  05/16/2003  T:  05/16/2003  Job:  [id number]   cc:   Areatha Keas, M.D.  [address]  [address]  [place]  [place] [address]  Fax: [phone]   [facility]   Osvaldo Shipper. Spruill, M.D.  [address] [address]  [place]  [place] [address]  Fax: [phone]   Leighton Roach. Truett Perna, M.D.  [address] [facility]  [place]  [place] [address]  Fax: [phone]

## 2010-10-16 NOTE — Consult Note (Signed)
NAME:  [name], [name]                         ACCOUNT NO.:  [id number]   MEDICAL RECORD NO.:  [id number]                   PATIENT TYPE:  INP   LOCATION:  3703                                 FACILITY:  [facility]   PHYSICIAN:  Leighton Roach. Truett Perna, M.D.              DATE OF BIRTH:  [birth year]   DATE OF CONSULTATION:  01/10/2003  DATE OF DISCHARGE:  01/11/2003                                   CONSULTATION   REASON FOR CONSULTATION:  [name] is a 77 year old admitted with a non-Q-  wave myocardial infarction.  We were consulted to evaluate a microcytic  anemia.   HISTORY OF PRESENT ILLNESS:  [name] was admitted on August 8 when she  presented with bilateral shoulder/arm pain with hand tingling.  The pain was  relieved with nitroglycerin in the emergency room.  The CPK and troponin  levels were elevated.  She was admitted and taken to cardiac catheterization  procedure on August 9.  The left ventricular systolic function was normal.  Significant stenoses were not identified.  Medical therapy was recommended.   On hospital admission, she was noted to be anemic.  Hemoglobin returned at  9.4 with hematocrit 28.7%, MCV 67.6, platelets 314,000, and white count  returned at 6300 with 4000 granulocytes.   [name] states that she has been anemic throughout her adult life.  She  states that a specific cause for the anemia has not been identified.  She  eats a normal diet.  She denies bleeding.  She denies a known family history  of anemia.   PAST MEDICAL HISTORY:  1. Hypertension.  2. G3, P3.  3. Diverticulosis identified on colonoscopic evaluations in January 2001 and     October 2003.  4. Normal upper endoscopy in 1995 per Dr. Daphine Deutscher Johnson's note of October     2003.   PAST SURGICAL HISTORY:  1. Hysterectomy in her 71s.  2. Appendectomy at age 60.   MEDICATIONS ON ADMISSION:  1. Toprol XL.  2. Aspirin.  3. Nexium.  4. Celebrex.   ALLERGIES:  No known drug allergies.   FAMILY HISTORY:  Noncontributory.   SOCIAL HISTORY:  She lives alone.  She works intermittently as a Comptroller.  She does not use tobacco or alcohol.  She was transfused when she was anemic  many years ago.   REVIEW OF SYSTEMS:  CONSTITUTIONAL:  She reports a 10 to 15-pound weight  loss over the past year that she associates with the illness of a brother.  RESPIRATORY: Negative.  CARDIAC: As per HPI.  GI: She has a hiatal hernia.  She has intermittent substernal pain.  GU: Negative.  GI: She has chronic  constipation that is relieved with laxatives.  HEMATOLOGIC:  She denies  bleeding.   PHYSICAL EXAMINATION:  HEENT:  Upper and lower denture plates.  Oropharynx  without a visible mass or lesion.  NECK:  Without palpable  mass.  LUNGS:  End inspiratory rales at the bases, no respiratory distress.  CARDIAC:  Regular rhythm, no gallop.  ABDOMEN:  Soft and nontender.  No organomegaly.  No mass.  Low midline  abdominal scar.  NODES:  No palpable axillary or inguinal nodes.  There are shotty  bilateral low anterior cervical/scalene nodes.  EXTREMITIES:  No edema.  NEUROLOGIC:  Alert and oriented.  Motor exam is grossly intact.  Face is  symmetric.  I did not test the gait.   LABORATORY DATA:  CBC from June 2002 revealed hemoglobin 10.6, hematocrit  33%, MCV 68.3, platelets 382,000.  CBC from June 2003, hemoglobin 10.5,  hematocrit 32%, MCV 69.3.  CBC January 2004, hemoglobin 10.4, MCV 69.1.   Labs from this hospital admission on August 10: Erythrocyte sedimentation  rate 38, ANA 1:40. Vitamin B12, 213.  RBC folate 337.  Urinalysis: Moderate  hemoglobin, 3 to 6 white cells, 3 to 6 red cells, many bacteria.  Reticulocyte count on August 9 was 1.5%, 63.5.  PT 13.1, PTT 28 on August 8.  BUN 20, creatinine 0.8, calcium 8.4, total protein 7.4, albumin 3.5.  AST  53, ALT 38, alkaline phosphatase 53, bilirubin 0.9.  Iron 49, TIBC 250,  percent saturation 20 on August 9, ferritin 332.   Review of  the peripheral blood smear:  The platelets appear normal in  number.  The white cell morphology is unremarkable.  There is a marked  variation of red cell size and shape.  There are numerous ovalocytes, tear  drop forms, targets, and cigar forms.  There are also a large number of  acanthocytes and microcytes.  The  polychromasia is mildly increased, and  there is basophilic stippling.  The red cells are hypochromic.  There is a  rare helmet cell.   IMPRESSION:  1. Non-Q-wave myocardial infarction.  2. Chronic microcytic anemia.  3. Pyuria/hematuria.  4. History of hypertension.  5. History of gastroesophageal reflux disease.   [name] has a chronic microcytic anemia.  The peripheral blood smear is  most consistent with a diagnosis of iron deficiency.  The elevated ferritin  on hospital admission may be an acute phase reactant related to the  myocardial infarction.   The anemia is chronic and has not changed significantly over the past  several years.   The differential diagnosis also includes anemia secondary to a  hemoglobinopathy or an infiltrative bone marrow process.   There is no apparent source for blood loss on review of her history today.  She has undergone colonoscopic evaluations in the recent past.  We should  consider an upper endoscopy given her reflux symptoms and a urinary tract  evaluation.  However, the hematuria is most likely related to a urinary  tract infection.   RECOMMENDATIONS:  1. Ferrous sulfate 325 mg t.i.d. with a repeat CBC in two to three weeks.  2. If iron deficiency is confirmed on a repeat CBC determination, then     consider GI/GU workup for a source of blood loss.  3. Continue post myocardial infarction care per Dr. Shana Chute.  4. I will arrange for a hematology followup appointment in two to three     weeks.   Thank you very much for this consultation.  Leighton Roach Truett Perna, M.D.    GBS/MEDQ   D:  01/10/2003  T:  01/11/2003  Job:  [id number]   cc:   Osvaldo Shipper. Spruill, M.D.  [address] [address]  [place]  [place] [address]  Fax: [phone]   Willis Modena, M.D.  [facility]   Hanson. Sharyn Lull, M.D.  [address]  [place]  [place] [address]  Fax: [phone]

## 2010-10-16 NOTE — Op Note (Signed)
   NAME:  [name], [name]                         ACCOUNT NO.:  [id number]   MEDICAL RECORD NO.:  [id number]                   PATIENT TYPE:  AMB   LOCATION:  ENDO                                 FACILITY:  [facility]   PHYSICIAN:  Danise Edge, M.D.                DATE OF BIRTH:  [birth year]   DATE OF PROCEDURE:  02/28/2002  DATE OF DISCHARGE:                                 OPERATIVE REPORT   PROCEDURE:  Screening colonoscopy.   INDICATIONS:  The patient is a 77 year old female who is scheduled to  undergo a screening colonoscopy with polypectomy to prevent colon cancer.   ENDOSCOPIST:  Danise Edge, M.D.   PREMEDICATION:  Versed 5 mg, Demerol 50 mg.   ENDOSCOPE:  Olympus pediatric colonoscope.   DESCRIPTION OF PROCEDURE:  After obtaining informed consent, the patient was  placed in the left lateral decubitus position.  I administered intravenous  Demerol and intravenous Versed to achieve conscious sedation for the  procedure.  The patient's blood pressure, oxygen saturation, and cardiac  rhythm were monitored throughout the procedure and documented in the medical  record.   Anal inspection was normal.  Digital rectal exam was normal.  The Olympus  pediatric video colonoscope was introduced into the rectum and advanced to  the cecum.  Colonic preparation for the exam today was excellent.   The patient has universal colonic diverticulosis without endoscopic signs  for the presence of diverticulitis or diverticular stricture formation.   Rectum normal.   Sigmoid colon and descending colon normal.   Splenic flexure normal.   Transverse colon normal.   Hepatic flexure normal.   Ascending colon normal.   Cecum and ileocecal valve normal.   ASSESSMENT:  Universal colonic diverticulosis, otherwise normal  proctocolonoscopy to the cecum.  No endoscopic evidence for the presence of  colorectal neoplasia.   RECOMMENDATIONS:  The patient was born [birth year].  Prior to being  sedated for  her colonoscopy today, she did mention that on occasions she feels as though  food traverses her esophagus slowly.  She reports no heartburn or  odynophagia.   I would recommend scheduling the patient for a barium tablet and liquid  barium swallow to determine if she has an esophageal stricture which could  be dilated with the endoscope.  She underwent an esophagogastroduodenoscopy  in 1995, which was normal.                                                 Danise Edge, M.D.    MJ/MEDQ  D:  02/28/2002  T:  02/28/2002  Job:  [id number]

## 2010-10-16 NOTE — Procedures (Signed)
[name]. [facility]  Patient:    [name]                         MRN: [id number] Proc. Date: 06/04/99 Adm. Date:  [id number] Attending:  Charna Elizabeth CC:         Osvaldo Shipper. Spruill, M.D.                           Procedure Report  DATE OF BIRTH: 17-Feb-1930.  PROCEDURE: Colonoscopy.  ENDOSCOPIST: Anselmo Rod, M.D.  INSTRUMENT: Olympus video colonoscope.  INDICATIONS:  History of polyps in a 77 year old black female, rule out recurrent polyps.  The patient had problems with abdominal pain in the recent past.  PREPROCEDURE PREPARATION:  Informed consent was procured from the patient.  The  patient was fasted for 8 hours prior to the procedure and prepped with a bottle of magnesium citrate and a gallon of NuLytely on the night prior to the procedure.  PHYSICAL EXAMINATION:  VITAL SIGNS:  The patient had stable vital signs.  NECK: The neck was supple.  CHEST:  The chest was clear to auscultation.  S1 and S2 were regular. ABDOMEN: The abdomen was soft with normal abdominal bowel sounds.  DESCRIPTION OF PROCEDURE: The patient was placed in the left lateral decubitus position, sedated with an additional 20 mg of Demerol and 1 mg of Versed intravenously.  Once the patient was adequately sedated and maintained on low-flow oxygen with continuous cardiac monitoring the Olympus video colonoscope was advanced from the rectum to the cecum without difficulty. The patient had some residual stool in the colon.  There was evidence of pandiverticular disease throughout the colon.  There was no masses, polyps, erosions or ulcerations seen. The patient tolerated the procedure well without complications.  IMPRESSION: Pandiverticulosis.  RECOMMENDATIONS: The patient had been advised to avoid nuts, seeds and pulp in er diet.  Liberal fluid intake has been advocated and outpatient followup is advised in the next ten days. DD:  06/04/99 TD:   06/04/99 Job: [id number]

## 2010-10-16 NOTE — Op Note (Signed)
NAME:  [name], [name]               ACCOUNT NO.:  [id number]   MEDICAL RECORD NO.:  [id number]          PATIENT TYPE:  AMB   LOCATION:  ENDO                         FACILITY:  [facility]   PHYSICIAN:  Anselmo Rod, M.D.  DATE OF BIRTH:  [birth year]   DATE OF PROCEDURE:  06/19/2004  DATE OF DISCHARGE:                                 OPERATIVE REPORT   PROCEDURE PERFORMED:  Esophagogastroduodenoscopy with small-bowel biopsies.   ENDOSCOPIST:  Anselmo Rod, M.D.   INSTRUMENT USED:  Olympus video panendoscope.   INDICATIONS FOR PROCEDURE:  A 77 year old [place] female with  history of iron-deficiency anemia, peptic ulcer disease in the remote past  and recent dysphagia, rule out esophagitis, stricture, etc.   PREPROCEDURE PREPARATION:  Informed consent was procured from the patient.  The patient fasted for eight hours prior to the procedure.   PREPROCEDURE PHYSICAL EXAMINATION:  VITAL SIGNS:  Stable.  NECK:  Supple.  CHEST:  Clear to auscultation.  CARDIOVASCULAR:  S1 and S2 regular.  ABDOMEN:  Soft with normal bowel sounds.   DESCRIPTION OF PROCEDURE:  The patient was placed in left lateral decubitus  position, sedated with 50 mg of Demerol and 4  mg of Versed in slow  incremental doses.  Once the patient was adequately sedated and maintained  on low flow oxygen and continuous cardiac monitoring, the Olympus video  panendoscope was advanced through the mouthpiece over the tongue and into  the esophagus under direct vision.  The esophagus seemed widely patent.  No  erosions, ulcerations, masses or polyps were seen.  There was diffuse  gastritis throughout the gastric mucosa.  Small-bowel biopsies were done to  rule out sprue.  No ulcers, erosions, masses or polyps were identified.  A  small hiatal hernia was seen on high retroflexion.   IMPRESSION:  1.  Normal appearing esophagus.  2.  Diffuse gastritis.  3.  Small-bowel biopsy to rule out sprue.  4.  Small hiatal  hernia.   RECOMMENDATIONS:  1.  Continue PPIs as before.  2.  Outpatient follow-up in the next two weeks or earlier if need be.      Jyot   JNM/MEDQ  D:  06/19/2004  T:  06/19/2004  Job:  [id number]   cc:   Osvaldo Shipper. Spruill, M.D.  [address] [address]  [place]  [place] [address]  Fax: [phone]   Areatha Keas, M.D.  [address]  [address]  [place]  [place] [address]  Fax: [phone]

## 2011-01-29 ENCOUNTER — Encounter: Payer: Self-pay | Admitting: *Deleted

## 2011-02-03 ENCOUNTER — Telehealth: Payer: Self-pay | Admitting: Internal Medicine

## 2011-03-09 LAB — CBC
Platelets: 270
RBC: 4.67
WBC: 6

## 2011-03-09 LAB — COMPREHENSIVE METABOLIC PANEL
ALT: 20
AST: 30
Albumin: 3.7
Alkaline Phosphatase: 58
CO2: 25
Chloride: 107
Creatinine, Ser: 0.75
GFR calc Af Amer: >60
GFR calc non Af Amer: >60
Potassium: 3.8
Sodium: 136
Total Bilirubin: 0.7

## 2011-03-09 LAB — DIFFERENTIAL
Basophils Absolute: 0
Eosinophils Absolute: 0.1
Eosinophils Relative: 1
Lymphocytes Relative: 13
Monocytes Absolute: 0.4

## 2011-03-23 ENCOUNTER — Emergency Department (HOSPITAL_COMMUNITY)
Admission: EM | Admit: 2011-03-23 | Discharge: 2011-03-23 | Disposition: A | Discharge status: Home / Self Care | Payer: Medicare Other | Attending: Emergency Medicine | Admitting: Emergency Medicine

## 2011-03-23 DIAGNOSIS — M542 Cervicalgia: Secondary | ICD-10-CM | POA: Insufficient documentation

## 2011-03-23 DIAGNOSIS — I10 Essential (primary) hypertension: Secondary | ICD-10-CM | POA: Insufficient documentation

## 2011-03-23 DIAGNOSIS — M79609 Pain in unspecified limb: Secondary | ICD-10-CM | POA: Insufficient documentation

## 2011-03-23 DIAGNOSIS — F0390 Unspecified dementia without behavioral disturbance: Secondary | ICD-10-CM | POA: Insufficient documentation

## 2011-03-23 DIAGNOSIS — M549 Dorsalgia, unspecified: Secondary | ICD-10-CM | POA: Insufficient documentation

## 2011-03-24 ENCOUNTER — Encounter: Payer: Medicare Other | Admitting: Internal Medicine

## 2011-06-08 ENCOUNTER — Encounter: Payer: Medicare Other | Admitting: Internal Medicine

## 2011-06-12 ENCOUNTER — Ambulatory Visit (INDEPENDENT_AMBULATORY_CARE_PROVIDER_SITE_OTHER): Payer: Medicare Other

## 2011-06-12 DIAGNOSIS — J209 Acute bronchitis, unspecified: Secondary | ICD-10-CM

## 2011-06-12 DIAGNOSIS — I509 Heart failure, unspecified: Secondary | ICD-10-CM

## 2011-07-06 ENCOUNTER — Other Ambulatory Visit: Payer: Self-pay | Admitting: Gastroenterology

## 2011-10-30 HISTORY — PX: CARDIAC CATHETERIZATION: SHX172

## 2011-11-24 ENCOUNTER — Inpatient Hospital Stay (HOSPITAL_COMMUNITY)
Admission: EM | Admit: 2011-11-24 | Discharge: 2011-12-15 | DRG: 981 | Disposition: A | Discharge status: Skilled Nursing Facility | Payer: Medicare Other | Attending: Family Medicine | Admitting: Family Medicine

## 2011-11-24 DIAGNOSIS — E46 Unspecified protein-calorie malnutrition: Secondary | ICD-10-CM | POA: Diagnosis present

## 2011-11-24 DIAGNOSIS — M25559 Pain in unspecified hip: Secondary | ICD-10-CM

## 2011-11-24 DIAGNOSIS — M1990 Unspecified osteoarthritis, unspecified site: Secondary | ICD-10-CM

## 2011-11-24 DIAGNOSIS — K560 Paralytic ileus: Secondary | ICD-10-CM | POA: Diagnosis not present

## 2011-11-24 DIAGNOSIS — F068 Other specified mental disorders due to known physiological condition: Secondary | ICD-10-CM | POA: Diagnosis present

## 2011-11-24 DIAGNOSIS — Z959 Presence of cardiac and vascular implant and graft, unspecified: Secondary | ICD-10-CM | POA: Diagnosis present

## 2011-11-24 DIAGNOSIS — I2510 Atherosclerotic heart disease of native coronary artery without angina pectoris: Secondary | ICD-10-CM | POA: Diagnosis present

## 2011-11-24 DIAGNOSIS — J189 Pneumonia, unspecified organism: Secondary | ICD-10-CM | POA: Diagnosis present

## 2011-11-24 DIAGNOSIS — R569 Unspecified convulsions: Secondary | ICD-10-CM | POA: Diagnosis present

## 2011-11-24 DIAGNOSIS — K219 Gastro-esophageal reflux disease without esophagitis: Secondary | ICD-10-CM | POA: Diagnosis present

## 2011-11-24 DIAGNOSIS — R55 Syncope and collapse: Secondary | ICD-10-CM | POA: Diagnosis present

## 2011-11-24 DIAGNOSIS — K450 Other specified abdominal hernia with obstruction, without gangrene: Secondary | ICD-10-CM | POA: Diagnosis present

## 2011-11-24 DIAGNOSIS — I10 Essential (primary) hypertension: Secondary | ICD-10-CM | POA: Diagnosis present

## 2011-11-24 DIAGNOSIS — R7989 Other specified abnormal findings of blood chemistry: Secondary | ICD-10-CM | POA: Diagnosis present

## 2011-11-24 DIAGNOSIS — F0390 Unspecified dementia without behavioral disturbance: Secondary | ICD-10-CM | POA: Diagnosis present

## 2011-11-24 DIAGNOSIS — R0902 Hypoxemia: Secondary | ICD-10-CM | POA: Diagnosis present

## 2011-11-24 DIAGNOSIS — D649 Anemia, unspecified: Secondary | ICD-10-CM | POA: Diagnosis present

## 2011-11-24 DIAGNOSIS — I5033 Acute on chronic diastolic (congestive) heart failure: Secondary | ICD-10-CM | POA: Diagnosis present

## 2011-11-24 DIAGNOSIS — I214 Non-ST elevation (NSTEMI) myocardial infarction: Principal | ICD-10-CM | POA: Diagnosis present

## 2011-11-24 DIAGNOSIS — I509 Heart failure, unspecified: Secondary | ICD-10-CM | POA: Diagnosis present

## 2011-11-24 DIAGNOSIS — E876 Hypokalemia: Secondary | ICD-10-CM | POA: Diagnosis not present

## 2011-11-24 HISTORY — DX: Other specified abdominal hernia with obstruction, without gangrene: K45.0

## 2011-11-25 ENCOUNTER — Emergency Department (HOSPITAL_COMMUNITY): Payer: Medicare Other

## 2011-11-25 ENCOUNTER — Encounter (HOSPITAL_COMMUNITY): Payer: Self-pay | Admitting: Emergency Medicine

## 2011-11-25 DIAGNOSIS — R0902 Hypoxemia: Secondary | ICD-10-CM | POA: Diagnosis present

## 2011-11-25 DIAGNOSIS — R7989 Other specified abnormal findings of blood chemistry: Secondary | ICD-10-CM | POA: Diagnosis present

## 2011-11-25 DIAGNOSIS — I248 Other forms of acute ischemic heart disease: Secondary | ICD-10-CM

## 2011-11-25 DIAGNOSIS — D696 Thrombocytopenia, unspecified: Secondary | ICD-10-CM

## 2011-11-25 DIAGNOSIS — J189 Pneumonia, unspecified organism: Secondary | ICD-10-CM | POA: Diagnosis present

## 2011-11-25 LAB — PROTIME-INR
INR: 1.02 (ref 0.00–1.49)
Prothrombin Time: 13.6 seconds (ref 11.6–15.2)

## 2011-11-25 LAB — URINALYSIS, ROUTINE W REFLEX MICROSCOPIC
Glucose, UA: NEGATIVE mg/dL
Protein, ur: 100 mg/dL — AB
Urobilinogen, UA: 1 mg/dL (ref 0.0–1.0)

## 2011-11-25 LAB — BASIC METABOLIC PANEL
BUN: 25 mg/dL — ABNORMAL HIGH (ref 6–23)
Calcium: 8.9 mg/dL (ref 8.4–10.5)
Chloride: 103 mEq/L (ref 96–112)
Creatinine, Ser: 0.62 mg/dL (ref 0.50–1.10)
GFR calc Af Amer: >90 mL/min (ref >90)

## 2011-11-25 LAB — CBC WITH DIFFERENTIAL/PLATELET
Basophils Absolute: 0 10*3/uL (ref 0.0–0.1)
HCT: 32.2 % — ABNORMAL LOW (ref 36.0–46.0)
Lymphs Abs: 0.8 10*3/uL (ref 0.7–4.0)
MCH: 20.8 pg — ABNORMAL LOW (ref 26.0–34.0)
MCHC: 32.9 g/dL (ref 30.0–36.0)
MCV: 63.3 fL — ABNORMAL LOW (ref 78.0–100.0)
Monocytes Absolute: 0.2 10*3/uL (ref 0.1–1.0)
Neutro Abs: 6.2 10*3/uL (ref 1.7–7.7)
Platelets: 243 10*3/uL (ref 150–400)
RDW: 17.6 % — ABNORMAL HIGH (ref 11.5–15.5)

## 2011-11-25 LAB — CARDIAC PANEL(CRET KIN+CKTOT+MB+TROPI)
CK, MB: 9.8 ng/mL (ref 0.3–4.0)
CK, MB: 9.3 ng/mL (ref 0.3–4.0)
Total CK: 393 U/L — ABNORMAL HIGH (ref 7–177)
Total CK: 341 U/L — ABNORMAL HIGH (ref 7–177)
Troponin I: 0.45 ng/mL (ref <0.30)

## 2011-11-25 LAB — PRO B NATRIURETIC PEPTIDE: Pro B Natriuretic peptide (BNP): 3056 pg/mL — ABNORMAL HIGH (ref 0–450)

## 2011-11-25 LAB — URINE MICROSCOPIC-ADD ON

## 2011-11-25 LAB — CBC
MCH: 20.7 pg — ABNORMAL LOW (ref 26.0–34.0)
Platelets: 314 10*3/uL (ref 150–400)
RBC: 4.84 MIL/uL (ref 3.87–5.11)
RDW: 17.7 % — ABNORMAL HIGH (ref 11.5–15.5)
WBC: 5.8 10*3/uL (ref 4.0–10.5)

## 2011-11-25 LAB — CREATININE, SERUM: Creatinine, Ser: 0.55 mg/dL (ref 0.50–1.10)

## 2011-11-25 MED ORDER — METOPROLOL TARTRATE 25 MG PO TABS
25.0000 mg | ORAL_TABLET | Freq: Two times a day (BID) | ORAL | Status: DC
  Administered 2011-11-25 – 2011-12-07 (×9): 25 mg via ORAL
  Filled 2011-11-25 (×27): qty 1

## 2011-11-25 MED ORDER — SODIUM CHLORIDE 0.9 % IV SOLN: 250.0000 mL | INTRAVENOUS | Status: DC | PRN

## 2011-11-25 MED ORDER — SODIUM CHLORIDE 0.9 % IV BOLUS (SEPSIS)
500.0000 mL | Freq: Once | INTRAVENOUS | Status: AC
  Administered 2011-11-25: 500 mL via INTRAVENOUS

## 2011-11-25 MED ORDER — SODIUM CHLORIDE 0.9 % IJ SOLN
3.0000 mL | Freq: Two times a day (BID) | INTRAMUSCULAR | Status: DC
  Administered 2011-11-25 – 2011-12-15 (×22): 3 mL via INTRAVENOUS

## 2011-11-25 MED ORDER — DIAZEPAM 5 MG PO TABS
5.0000 mg | ORAL_TABLET | ORAL | Status: AC
  Administered 2011-11-25: 5 mg via ORAL
  Filled 2011-11-25: qty 1

## 2011-11-25 MED ORDER — MORPHINE SULFATE 2 MG/ML IJ SOLN
1.0000 mg | INTRAMUSCULAR | Status: DC | PRN
  Administered 2011-11-25 (×2): 1 mg via INTRAVENOUS
  Filled 2011-11-25 (×2): qty 1

## 2011-11-25 MED ORDER — HEPARIN SODIUM (PORCINE) 5000 UNIT/ML IJ SOLN
5000.0000 [IU] | Freq: Three times a day (TID) | INTRAMUSCULAR | Status: DC
  Administered 2011-11-25: 5000 [IU] via SUBCUTANEOUS
  Filled 2011-11-25 (×6): qty 1

## 2011-11-25 MED ORDER — MORPHINE SULFATE 4 MG/ML IJ SOLN
4.0000 mg | Freq: Once | INTRAMUSCULAR | Status: AC
  Administered 2011-11-25: 4 mg via INTRAVENOUS
  Filled 2011-11-25: qty 1

## 2011-11-25 MED ORDER — MORPHINE SULFATE 2 MG/ML IJ SOLN
2.0000 mg | Freq: Once | INTRAMUSCULAR | Status: AC
  Administered 2011-11-25: 2 mg via INTRAVENOUS
  Filled 2011-11-25: qty 1

## 2011-11-25 MED ORDER — SODIUM CHLORIDE 0.9 % IJ SOLN
3.0000 mL | INTRAMUSCULAR | Status: DC | PRN
  Administered 2011-11-27: 3 mL via INTRAVENOUS

## 2011-11-25 MED ORDER — SODIUM CHLORIDE 0.9 % IJ SOLN: 3.0000 mL | Freq: Two times a day (BID) | INTRAMUSCULAR | Status: DC

## 2011-11-25 MED ORDER — ACETAMINOPHEN 325 MG PO TABS
650.0000 mg | ORAL_TABLET | ORAL | Status: DC | PRN
  Administered 2011-11-26 – 2011-12-10 (×7): 650 mg via ORAL
  Filled 2011-11-25 (×9): qty 2

## 2011-11-25 MED ORDER — AZITHROMYCIN 250 MG PO TABS
250.0000 mg | ORAL_TABLET | Freq: Every day | ORAL | Status: AC
  Administered 2011-11-26 – 2011-11-28 (×3): 250 mg via ORAL
  Filled 2011-11-25 (×6): qty 1

## 2011-11-25 MED ORDER — ONDANSETRON HCL 4 MG/2ML IJ SOLN
4.0000 mg | Freq: Four times a day (QID) | INTRAMUSCULAR | Status: DC | PRN
  Administered 2011-11-25 – 2011-11-29 (×8): 4 mg via INTRAVENOUS
  Filled 2011-11-25 (×8): qty 2

## 2011-11-25 MED ORDER — ASPIRIN 325 MG PO TABS
325.0000 mg | ORAL_TABLET | Freq: Every day | ORAL | Status: DC
  Administered 2011-11-25: 325 mg via ORAL
  Filled 2011-11-25: qty 1

## 2011-11-25 MED ORDER — DEXTROSE 5 % IV SOLN
1.0000 g | INTRAVENOUS | Status: DC
  Administered 2011-11-25 – 2011-12-07 (×13): 1 g via INTRAVENOUS
  Filled 2011-11-25 (×14): qty 10

## 2011-11-25 MED ORDER — SODIUM CHLORIDE 0.9 % IV SOLN
INTRAVENOUS | Status: DC
  Administered 2011-11-26: 05:00:00 via INTRAVENOUS

## 2011-11-25 MED ORDER — SODIUM CHLORIDE 0.9 % IJ SOLN: 3.0000 mL | INTRAMUSCULAR | Status: DC | PRN

## 2011-11-25 MED ORDER — ONDANSETRON HCL 4 MG/2ML IJ SOLN
4.0000 mg | Freq: Once | INTRAMUSCULAR | Status: AC
  Administered 2011-11-25: 4 mg via INTRAVENOUS
  Filled 2011-11-25: qty 2

## 2011-11-25 MED ORDER — MORPHINE SULFATE 2 MG/ML IJ SOLN
1.0000 mg | Freq: Once | INTRAMUSCULAR | Status: AC
  Administered 2011-11-25: 1 mg via INTRAVENOUS
  Filled 2011-11-25: qty 1

## 2011-11-25 MED ORDER — NITROGLYCERIN 0.4 MG SL SUBL: 0.4000 mg | SUBLINGUAL_TABLET | SUBLINGUAL | Status: DC | PRN

## 2011-11-25 MED ORDER — ASPIRIN 81 MG PO CHEW
324.0000 mg | CHEWABLE_TABLET | ORAL | Status: DC
  Filled 2011-11-25: qty 4

## 2011-11-25 MED ORDER — DONEPEZIL HCL 5 MG PO TABS
5.0000 mg | ORAL_TABLET | Freq: Every day | ORAL | Status: DC
  Administered 2011-11-25 – 2011-12-15 (×15): 5 mg via ORAL
  Filled 2011-11-25 (×22): qty 1

## 2011-11-25 MED ORDER — ASPIRIN EC 81 MG PO TBEC
81.0000 mg | DELAYED_RELEASE_TABLET | Freq: Every day | ORAL | Status: DC
  Administered 2011-11-27 – 2011-12-15 (×13): 81 mg via ORAL
  Filled 2011-11-25 (×21): qty 1

## 2011-11-25 MED ORDER — NITROGLYCERIN 2 % TD OINT
0.5000 [in_us] | TOPICAL_OINTMENT | Freq: Three times a day (TID) | TRANSDERMAL | Status: DC
  Administered 2011-11-25 – 2011-12-04 (×25): 0.5 [in_us] via TOPICAL
  Filled 2011-11-25 (×2): qty 30

## 2011-11-25 MED ORDER — SIMVASTATIN 20 MG PO TABS
20.0000 mg | ORAL_TABLET | Freq: Every day | ORAL | Status: DC
  Administered 2011-11-25 – 2011-12-14 (×13): 20 mg via ORAL
  Filled 2011-11-25 (×23): qty 1

## 2011-11-25 MED ORDER — PANTOPRAZOLE SODIUM 40 MG PO TBEC
40.0000 mg | DELAYED_RELEASE_TABLET | Freq: Every day | ORAL | Status: DC
  Administered 2011-11-25 – 2011-11-28 (×3): 40 mg via ORAL
  Filled 2011-11-25 (×4): qty 1

## 2011-11-25 MED ORDER — AZITHROMYCIN 250 MG PO TABS
500.0000 mg | ORAL_TABLET | Freq: Every day | ORAL | Status: AC
  Administered 2011-11-25: 500 mg via ORAL
  Filled 2011-11-25: qty 1
  Filled 2011-11-25: qty 2

## 2011-11-25 NOTE — ED Notes (Signed)
Pt alert, arrives from home, c/o left hip pain, aching all over, emesis, onset this evening, resp even unlaobred, skin pwd

## 2011-11-25 NOTE — ED Notes (Signed)
Peter, PA at bedside.

## 2011-11-25 NOTE — ED Notes (Signed)
Pt reporting left hip pain that started around 9pm.  Describes pain as a constant sharp pain.  Rates pain 10/10.  Pain increases upon palpation and radiates to her lower back.  Left lower back pain noted when palpated.  Denies any voiding problems.  Prior to admission, pt was having constipation and took milk of magnesia and therefore now having diarrhea. Pt able to ambulate with aid of a cane but occasional episodes of muscle spasms noted.  Pt endorses an episode of vomiting a small amount of clear fluid.  Currently feels nauseated.

## 2011-11-25 NOTE — H&P (Signed)
PCP:   Dr. Sharyn Lull  Chief Complaint:  SOB, Left sided discomfort  HPI: Patient is a 78 y/o AAF with history of Arthritis, CAD, seizure d/o, dementia.  Is presenting to the ED mainly for left sided discomfort.  But while in the ED patient was found to be hypoxic especially with ambulation.  Reportedly had a drop in oxygen saturation to 85% while ambulating.  Patient lives at home and is not on oxygen at home.  She mentions that she has noticed herself be more short of breath starting yesterday and has had some mild productive cough of clear phlem but otherwise denies any fever, chills, chest pain, hemoptysis, or swollen extremities.    Also had been complaining of left sided discomfort that is mainly at left hip and left lower back.  Patient denies any recent trauma, falls, focal neurological weakness.  Started while she was in church within the last couple of days.  Daughter also reports that patient had been having syncope (of which she denies any trauma to her left side) and as a result her cardiologist placed in implantable loop recorder.  In the ED labs showed a WBC of 7.2 and patient has been afebrile.  H/H of 10.6/32.2, elevated BUN/Creatinine ratio 25/0.62, U/A which showed cloudy, spec gravity > 1.031, negative leukocytes and nitrite. I troponin was elevated 0.35, EKG showed sinus rhythm with R BBB which seemed unchanged from prior.  X rays of her chest bilateral interstitial lung densities, right side greater than right. Differential diagnosis includes asymmetrically edema versus atypical infection.  X rays of her L hip showed degenerative changes but no acute bony abnormalities.  X ray of her lumbar spine showed:  Diffuse demineralization suggesting osteoporosis. Stable mild anterior subluxation of L4 on L5. No acute bony abnormalities are appreciated.  We were called for evaluation for admission secondary to hypoxia and suspected pneumonia.  Allergies:   Allergies  Allergen  Reactions  . Codeine   Patient reports that she "gets hot" when she ingests codeine.    Past Medical History  Diagnosis Date  . Syncope   . Unspecified cardiac device in situ     Loop recorder  . Atypical chest pain     Nonobstructive catheterization; negative Myoview 2011  . CVA 11/23/2008  . SEIZURE DISORDER 11/23/2008  . DEMENTIA 11/23/2008  . HYPERTENSION 11/23/2008  . Coronary artery disease     Past Surgical History  Procedure Date  . Loop recorder insertion     Prior to Admission medications   Medication Sig Start Date End Date Taking? Authorizing Provider  donepezil (ARICEPT) 5 MG tablet Take 1 tablet by mouth daily. 08/20/10  Yes Historical Provider, MD  HYDROcodone-acetaminophen (VICODIN) 5-500 MG per tablet Take 1 tablet by mouth as needed. 07/16/10  Yes Historical Provider, MD  Multiple Vitamin (MULTIVITAMIN) tablet Take 1 tablet by mouth daily.     Yes Historical Provider, MD  NEXIUM 40 MG capsule Take 1 tablet by mouth daily. 08/19/10  Yes Historical Provider, MD  polyethylene glycol (MIRALAX / GLYCOLAX) packet Take 17 g by mouth daily.     Yes Historical Provider, MD    Social History:  reports that she has never smoked. She does not have any smokeless tobacco history on file. She reports that she does not drink alcohol or use illicit drugs.  Family History  Problem Relation Age of Onset  . Heart failure Neg Hx   . Stroke    . Heart disease  Review of Systems:  Constitutional: Denies fever, chills, diaphoresis, appetite change and fatigue.  HEENT: Denies photophobia, eye pain, redness, hearing loss, ear pain, congestion, sore throat, rhinorrhea, sneezing, mouth sores, trouble swallowing, neck pain, neck stiffness and tinnitus.   Respiratory: + SOB, + DOE, + cough, denies chest tightness,  and wheezing.   Cardiovascular: Denies chest pain, palpitations and leg swelling.  Gastrointestinal: Denies nausea, vomiting, abdominal pain, diarrhea, constipation, blood  in stool and abdominal distention.  Genitourinary: Denies dysuria, urgency, frequency, hematuria, flank pain and difficulty urinating.  Musculoskeletal: + myalgias, back pain, joint swelling, + arthralgias and gait problem.  Skin: Denies pallor, rash and wound.  Neurological: Denies dizziness, seizures, syncope, weakness, light-headedness, numbness and headaches.  Hematological: Denies adenopathy. Easy bruising, personal or family bleeding history  Psychiatric/Behavioral: Denies suicidal ideation, mood changes, confusion, nervousness, sleep disturbance and agitation   Physical Exam: Blood pressure 152/89, pulse 94, temperature 98.4 F (36.9 C), temperature source Oral, resp. rate 22, SpO2 100.00%. General: Alert, awake, oriented x3, in no acute distress. HEENT: No bruits, no goiter. Heart: Regular rate and rhythm, without murmurs, rubs, gallops. Lungs: no increased WOB, Diffuse rhales R>L, no wheezes Abdomen: Soft, nontender, nondistended, positive bowel sounds. Extremities: No clubbing cyanosis or edema with positive pedal pulses. Neuro: Grossly intact, nonfocal.   Labs on Admission:  Results for orders placed during the hospital encounter of 11/24/11 (from the past 48 hour(s))  CBC WITH DIFFERENTIAL     Status: Abnormal   Collection Time   11/25/11  1:35 AM      Component Value Range Comment   WBC 7.2  4.0 - 10.5 K/uL    RBC 5.09  3.87 - 5.11 MIL/uL    Hemoglobin 10.6 (*) 12.0 - 15.0 g/dL    HCT 16.1 (*) 09.6 - 46.0 %    MCV 63.3 (*) 78.0 - 100.0 fL    MCH 20.8 (*) 26.0 - 34.0 pg    MCHC 32.9  30.0 - 36.0 g/dL    RDW 04.5 (*) 40.9 - 15.5 %    Platelets 243  150 - 400 K/uL    Neutrophils Relative 86 (*) 43 - 77 %    Lymphocytes Relative 11 (*) 12 - 46 %    Monocytes Relative 3  3 - 12 %    Eosinophils Relative 0  0 - 5 %    Basophils Relative 0  0 - 1 %    Neutro Abs 6.2  1.7 - 7.7 K/uL    Lymphs Abs 0.8  0.7 - 4.0 K/uL    Monocytes Absolute 0.2  0.1 - 1.0 K/uL     Eosinophils Absolute 0.0  0.0 - 0.7 K/uL    Basophils Absolute 0.0  0.0 - 0.1 K/uL    RBC Morphology OVALOCYTES     BASIC METABOLIC PANEL     Status: Abnormal   Collection Time   11/25/11  1:35 AM      Component Value Range Comment   Sodium 137  135 - 145 mEq/L    Potassium 3.7  3.5 - 5.1 mEq/L    Chloride 103  96 - 112 mEq/L    CO2 22  19 - 32 mEq/L    Glucose, Bld 134 (*) 70 - 99 mg/dL    BUN 25 (*) 6 - 23 mg/dL    Creatinine, Ser 8.11  0.50 - 1.10 mg/dL    Calcium 8.9  8.4 - 91.4 mg/dL    GFR calc non Af Amer 84 (*) >  90 mL/min    GFR calc Af Amer >90  >90 mL/min   URINALYSIS, ROUTINE W REFLEX MICROSCOPIC     Status: Abnormal   Collection Time   11/25/11  2:21 AM      Component Value Range Comment   Color, Urine AMBER (*) YELLOW BIOCHEMICALS MAY BE AFFECTED BY COLOR   APPearance CLOUDY (*) CLEAR    Specific Gravity, Urine 1.031 (*) 1.005 - 1.030    pH 6.0  5.0 - 8.0    Glucose, UA NEGATIVE  NEGATIVE mg/dL    Hgb urine dipstick TRACE (*) NEGATIVE    Bilirubin Urine SMALL (*) NEGATIVE    Ketones, ur TRACE (*) NEGATIVE mg/dL    Protein, ur 604 (*) NEGATIVE mg/dL    Urobilinogen, UA 1.0  0.0 - 1.0 mg/dL    Nitrite NEGATIVE  NEGATIVE    Leukocytes, UA NEGATIVE  NEGATIVE   URINE MICROSCOPIC-ADD ON     Status: Abnormal   Collection Time   11/25/11  2:21 AM      Component Value Range Comment   Squamous Epithelial / LPF FEW (*) RARE    WBC, UA 0-2  <3 WBC/hpf    RBC / HPF 0-2  <3 RBC/hpf    Bacteria, UA FEW (*) RARE    Crystals CA OXALATE CRYSTALS (*) NEGATIVE    Urine-Other MUCOUS PRESENT       Radiological Exams on Admission: Dg Chest 2 View  11/25/2011  *RADIOLOGY REPORT*  Clinical Data: Hypoxia and hypertension.  CHEST - 2 VIEW  Comparison: 09/20/2010  Findings: Two views of the chest demonstrate chronic elevation of the right hemidiaphragm.  There are interstitial lung densities bilaterally, right side greater than left.  Heart size is within normal limits and stable.   Stable electronic device overlying the left side of the chest.  No evidence for pleural effusions. Mild scoliosis of the thoracic spine.  IMPRESSION: There are bilateral interstitial lung densities, right side greater than right.  Differential diagnosis includes asymmetrically edema versus atypical infection.  Original Report Authenticated By: Richarda Overlie, M.D.   Dg Lumbar Spine Complete  11/25/2011  *RADIOLOGY REPORT*  Clinical Data: The low back pain and hip pain for several weeks. No known injury.  LUMBAR SPINE - COMPLETE 4+ VIEW  Comparison: MRI lumbar spine 05/10/2008  Findings: Five lumbar type vertebral bodies.  Mild anterior subluxation of L4 on L5, similar to previous study.  Otherwise normal alignment of the lumbar vertebrae and facet joints.  Diffuse bone demineralization with mild ballooning of interspaces suggesting osteoporosis.  No change since previous study.  No vertebral compression deformities.  Intervertebral disc space heights are preserved.  Bone cortex and trabecular architecture appear intact.  No focal bone lesion or bone destruction.  Vascular calcifications.  Right upper quadrant calcifications likely represent renal stone or gallstones.  IMPRESSION: Diffuse demineralization suggesting osteoporosis.  Stable mild anterior subluxation of L4 on L5.  No acute bony abnormalities are appreciated.  Original Report Authenticated By: Marlon Pel, M.D.   Dg Hip Complete Left  11/25/2011  *RADIOLOGY REPORT*  Clinical Data: Left hip pain for several weeks.  No known injury.  LEFT HIP - COMPLETE 2+ VIEW  Comparison: None.  Findings: Mild degenerative changes in both hips.  The left hip appears intact.  No evidence of acute fracture or subluxation.  No focal bone lesion or bone destruction.  Bone cortex and trabecular architecture appear intact.  The pelvis, sacrum, SI joints, and symphysis pubis appear intact.  Calcifications in the pelvis consistent with phleboliths.  IMPRESSION:  Degenerative changes in the hips.  No acute bony abnormalities appreciated.  Original Report Authenticated By: Marlon Pel, M.D.    Assessment/Plan Active Problems:  Elevated Troponin: - At this point have placed orders for aspirin - Patient has received morphine - supplemental oxygen - Consult her cardiology group: [place]: Have discussed with cardiology and given that patient is asymptomatic and chest x ray is showing edema most likely is demand ischemia as indicated below.  At this time will not start heparin gtt.  Cardiology will evaluate patient today and make further recommendations. - EKG unchanged and patient denies any chest pain currently thus at this point suspect demand ischemia.  Patient has implantable loop recorder and the possibility for arrythmias may be present although of note while on telemetry monitoring here and EKG patient has been Sinus Rhythm.  Hypoxia - CAP vs asymmetrical edema.   - At this point will go ahead and cover with antibiotics - supplemental oxygen - Given # 1 edema may be secondary to cardiac cause, will order Echocardiogram.  Dementia - Stable will plan on continuing aricept  Gastroesophageal reflux - Protonix while in house.  Currently stable  H/o syncope - Patient currently has   Anemia: - Patient reportedly had a colonoscopy a year ago and daughter reports that there were no abnormalities reported. - order occult blood testing  Time Spent on Admission: > 60 minutes medical decision making, discussing with ED health care personnel, documenting, updating information systems.   Penny Pia [facility] Pager: [phone] 11/25/2011, 9:56 AM

## 2011-11-25 NOTE — ED Notes (Signed)
Bed:WA07<BR> Expected date:<BR> Expected time:<BR> Means of arrival:<BR> Comments:<BR> Triage 1

## 2011-11-25 NOTE — ED Provider Notes (Signed)
History     CSN: [id number]  Arrival date & time 11/24/11  2352   First MD Initiated Contact with Patient 11/25/11 [phone]      Chief Complaint  Patient presents with  . Hip Pain   HPI  History provided by the patient and family. Patient is a 78 year old female with history of hypertension, mild dementia, seizure disorder and arthritis who presents with complaints of body aches and left hip pains. Family states they were at church this evening and stated was called inside the church with air conditioning. After leaving around 9 PM patient complains of left hip and bodyaches. Family does mention that patient recently had some issues with constipation but use some milk of magnesia and was able to go with a very loose bowel movement after leaving church. Symptoms are described as moderate. Pain is worse with some movements. Pain is better with certain positions and rest. Patient denies any other aggravating or alleviating factors. Patient denies having any abdominal pain. She denies any fever, chills, sweats. She denies any dysuria, hematuria, urinary frequency or flank pain patient denies any chest pain or shortness of breath.     Past Medical History  Diagnosis Date  . Syncope   . Unspecified cardiac device in situ     Loop recorder  . Atypical chest pain     Nonobstructive catheterization; negative Myoview 2011  . CVA 11/23/2008  . SEIZURE DISORDER 11/23/2008  . DEMENTIA 11/23/2008  . HYPERTENSION 11/23/2008  . Coronary artery disease     Past Surgical History  Procedure Date  . Loop recorder insertion     Family History  Problem Relation Age of Onset  . Heart failure Neg Hx   . Stroke    . Heart disease      History  Substance Use Topics  . Smoking status: Never Smoker   . Smokeless tobacco: Not on file  . Alcohol Use: No    OB History    Grav Para Term Preterm Abortions TAB SAB Ect Mult Living                  Review of Systems  Constitutional: Negative for  fever, chills and appetite change.  Gastrointestinal: Positive for constipation. Negative for abdominal pain.  Genitourinary: Negative for dysuria, frequency, hematuria and flank pain.  Musculoskeletal: Positive for myalgias, back pain and arthralgias.       Hip pain  Skin: Negative for rash.    Allergies  Codeine  Home Medications   Current Outpatient Rx  Name Route Sig Dispense Refill  . DONEPEZIL HCL 5 MG PO TABS Oral Take 1 tablet by mouth daily.    Marland Kitchen HYDROCODONE-ACETAMINOPHEN 5-500 MG PO TABS Oral Take 1 tablet by mouth as needed.    Marland Kitchen ONE-DAILY MULTI VITAMINS PO TABS Oral Take 1 tablet by mouth daily.      Marland Kitchen NEXIUM 40 MG PO CPDR Oral Take 1 tablet by mouth daily.    Marland Kitchen POLYETHYLENE GLYCOL 3350 PO PACK Oral Take 17 g by mouth daily.        BP 150/77  Pulse 82  Temp 97.8 F (36.6 C) (Oral)  Resp 16  SpO2 100%  Physical Exam  Nursing note and vitals reviewed. Constitutional: She is oriented to person, place, and time. She appears well-developed and well-nourished. No distress.  HENT:  Head: Normocephalic.  Cardiovascular: Normal rate and regular rhythm.   Pulmonary/Chest: Effort normal and breath sounds normal. No respiratory distress. She has no wheezes.  She has no rales.  Abdominal: Soft. There is no tenderness. There is no rebound and no guarding.  Musculoskeletal:       Cervical back: Normal.       Thoracic back: Normal.       Lumbar back: She exhibits tenderness.       Back:       Tenderness over left iliac crests and hip area. No gross deformities. Normal range of motion hip.  Neurological: She is alert and oriented to person, place, and time.  Skin: Skin is warm and dry. No rash noted.  Psychiatric: She has a normal mood and affect. Her behavior is normal.    ED Course  Procedures  Results for orders placed during the hospital encounter of 11/24/11  URINALYSIS, ROUTINE W REFLEX MICROSCOPIC      Component Value Range   Color, Urine AMBER (*) YELLOW    APPearance CLOUDY (*) CLEAR   Specific Gravity, Urine 1.031 (*) 1.005 - 1.030   pH 6.0  5.0 - 8.0   Glucose, UA NEGATIVE  NEGATIVE mg/dL   Hgb urine dipstick TRACE (*) NEGATIVE   Bilirubin Urine SMALL (*) NEGATIVE   Ketones, ur TRACE (*) NEGATIVE mg/dL   Protein, ur 409 (*) NEGATIVE mg/dL   Urobilinogen, UA 1.0  0.0 - 1.0 mg/dL   Nitrite NEGATIVE  NEGATIVE   Leukocytes, UA NEGATIVE  NEGATIVE  CBC WITH DIFFERENTIAL      Component Value Range   WBC 7.2  4.0 - 10.5 K/uL   RBC 5.09  3.87 - 5.11 MIL/uL   Hemoglobin 10.6 (*) 12.0 - 15.0 g/dL   HCT 81.1 (*) 91.4 - 78.2 %   MCV 63.3 (*) 78.0 - 100.0 fL   MCH 20.8 (*) 26.0 - 34.0 pg   MCHC 32.9  30.0 - 36.0 g/dL   RDW 95.6 (*) 21.3 - 08.6 %   Platelets 243  150 - 400 K/uL   Neutrophils Relative 86 (*) 43 - 77 %   Lymphocytes Relative 11 (*) 12 - 46 %   Monocytes Relative 3  3 - 12 %   Eosinophils Relative 0  0 - 5 %   Basophils Relative 0  0 - 1 %   Neutro Abs 6.2  1.7 - 7.7 K/uL   Lymphs Abs 0.8  0.7 - 4.0 K/uL   Monocytes Absolute 0.2  0.1 - 1.0 K/uL   Eosinophils Absolute 0.0  0.0 - 0.7 K/uL   Basophils Absolute 0.0  0.0 - 0.1 K/uL   RBC Morphology OVALOCYTES    BASIC METABOLIC PANEL      Component Value Range   Sodium 137  135 - 145 mEq/L   Potassium 3.7  3.5 - 5.1 mEq/L   Chloride 103  96 - 112 mEq/L   CO2 22  19 - 32 mEq/L   Glucose, Bld 134 (*) 70 - 99 mg/dL   BUN 25 (*) 6 - 23 mg/dL   Creatinine, Ser 5.78  0.50 - 1.10 mg/dL   Calcium 8.9  8.4 - 46.9 mg/dL   GFR calc non Af Amer 84 (*) >90 mL/min   GFR calc Af Amer >90  >90 mL/min  URINE MICROSCOPIC-ADD ON      Component Value Range   Squamous Epithelial / LPF FEW (*) RARE   WBC, UA 0-2  <3 WBC/hpf   RBC / HPF 0-2  <3 RBC/hpf   Bacteria, UA FEW (*) RARE   Crystals CA OXALATE CRYSTALS (*) NEGATIVE  Urine-Other MUCOUS PRESENT         Dg Lumbar Spine Complete  11/25/2011  *RADIOLOGY REPORT*  Clinical Data: The low back pain and hip pain for several weeks. No  known injury.  LUMBAR SPINE - COMPLETE 4+ VIEW  Comparison: MRI lumbar spine 05/10/2008  Findings: Five lumbar type vertebral bodies.  Mild anterior subluxation of L4 on L5, similar to previous study.  Otherwise normal alignment of the lumbar vertebrae and facet joints.  Diffuse bone demineralization with mild ballooning of interspaces suggesting osteoporosis.  No change since previous study.  No vertebral compression deformities.  Intervertebral disc space heights are preserved.  Bone cortex and trabecular architecture appear intact.  No focal bone lesion or bone destruction.  Vascular calcifications.  Right upper quadrant calcifications likely represent renal stone or gallstones.  IMPRESSION: Diffuse demineralization suggesting osteoporosis.  Stable mild anterior subluxation of L4 on L5.  No acute bony abnormalities are appreciated.  Original Report Authenticated By: Marlon Pel, M.D.   Dg Hip Complete Left  11/25/2011  *RADIOLOGY REPORT*  Clinical Data: Left hip pain for several weeks.  No known injury.  LEFT HIP - COMPLETE 2+ VIEW  Comparison: None.  Findings: Mild degenerative changes in both hips.  The left hip appears intact.  No evidence of acute fracture or subluxation.  No focal bone lesion or bone destruction.  Bone cortex and trabecular architecture appear intact.  The pelvis, sacrum, SI joints, and symphysis pubis appear intact.  Calcifications in the pelvis consistent with phleboliths.  IMPRESSION: Degenerative changes in the hips.  No acute bony abnormalities appreciated.  Original Report Authenticated By: Marlon Pel, M.D.     1. Hip pain   2. Arthritis       MDM  2:15 a.m. patient seen and evaluated. Patient in no acute distress.   Patient feeling a little bit better after pain medications. Labs and x-rays unremarkable. Patient still having some discomfort and we'll re\re dose of pain medicine.   Patient now has no pains patient was up ambulating in the hallway.  Patient states she's ready to return home.  Upon discharge patient was found to have some decreasing oxygen saturations. Patient denies feeling short of breath. She has normal respirations. Patient has normal heart rate. Lungs remain clear on exam. However we will get chest x-ray to evaluate further.  Patient was discussed in sign out with Fayrene Helper PA-C.  He will follow CXR and re-eval pt.   Angus Seller, [place] 11/25/11 [phone]

## 2011-11-25 NOTE — Discharge Instructions (Signed)
You were seen and evaluated for your complaints of hips and back pains. Your blood test today did not show any concerning findings your x-rays today also did not show any signs for broken bones or other concerning causes of your pain. You were treated for your pain and reported feeling much better. At this time your providers feel that you may return home and followup with your primary care provider. Please call them later today to schedule close followup appointment. Return to the emergency room for any worsening pain or symptoms.

## 2011-11-25 NOTE — ED Notes (Signed)
Patient transported to X-ray 

## 2011-11-25 NOTE — ED Notes (Signed)
Pt. Oxygen level dropped to 88% when walking without oxygen. When she walked with oxygen she was at 100%. She was only able to walk to the door and back to the bed because her hip was bothering her badly.

## 2011-11-25 NOTE — ED Notes (Signed)
O2 sat was in the upper 80s to 91%. Peter, PA notified and recommended her to have chest x-ray and to check O2 sat when ambulating.

## 2011-11-25 NOTE — Consult Note (Signed)
Reason for Consult: Chest pain elevated cardiac enzymes Referring Physician: [facility]  [name] is an 78 y.o. female.  HPI: Patient is 78 year old female with past medical history significant for coronary artery disease history of non-Q-wave myocardial infarction in the past hypertension history of recurrent syncope status post loop recorder GERD dementia chronic anemia valvular heart disease degenerative joint disease history of questionable seizure disorder was admitted earlier today because of vague left-sided chest pain associated with shortness of breath and hypoxia. Patient also complains of a cough with whitish yellow phlegm denies any fever or chills. Patient also complains of musculoskeletal pain in the back and hip. Denies history of trauma or fall. Cardiologic consultation is called as patient was noted to have mildly elevated troponin I.  Past Medical History  Diagnosis Date  . Syncope   . Unspecified cardiac device in situ     Loop recorder  . Atypical chest pain     Nonobstructive catheterization; negative Myoview 2011  . CVA 11/23/2008  . SEIZURE DISORDER 11/23/2008  . DEMENTIA 11/23/2008  . HYPERTENSION 11/23/2008  . Coronary artery disease     Past Surgical History  Procedure Date  . Loop recorder insertion   . Colonoscopy   . Eye surgery   . Cataract extraction, bilateral     Family History  Problem Relation Age of Onset  . Heart failure Neg Hx   . Stroke    . Heart disease      Social History:  reports that she has never smoked. She has never used smokeless tobacco. She reports that she does not drink alcohol or use illicit drugs.  Allergies:  Allergies  Allergen Reactions  . Codeine     Medications: I have reviewed the patient's current medications.  Results for orders placed during the hospital encounter of 11/24/11 (from the past 48 hour(s))  CBC WITH DIFFERENTIAL     Status: Abnormal   Collection Time   11/25/11  1:35 AM   Component Value Range Comment   WBC 7.2  4.0 - 10.5 K/uL    RBC 5.09  3.87 - 5.11 MIL/uL    Hemoglobin 10.6 (*) 12.0 - 15.0 g/dL    HCT 47.8 (*) 29.5 - 46.0 %    MCV 63.3 (*) 78.0 - 100.0 fL    MCH 20.8 (*) 26.0 - 34.0 pg    MCHC 32.9  30.0 - 36.0 g/dL    RDW 62.1 (*) 30.8 - 15.5 %    Platelets 243  150 - 400 K/uL    Neutrophils Relative 86 (*) 43 - 77 %    Lymphocytes Relative 11 (*) 12 - 46 %    Monocytes Relative 3  3 - 12 %    Eosinophils Relative 0  0 - 5 %    Basophils Relative 0  0 - 1 %    Neutro Abs 6.2  1.7 - 7.7 K/uL    Lymphs Abs 0.8  0.7 - 4.0 K/uL    Monocytes Absolute 0.2  0.1 - 1.0 K/uL    Eosinophils Absolute 0.0  0.0 - 0.7 K/uL    Basophils Absolute 0.0  0.0 - 0.1 K/uL    RBC Morphology OVALOCYTES     BASIC METABOLIC PANEL     Status: Abnormal   Collection Time   11/25/11  1:35 AM      Component Value Range Comment   Sodium 137  135 - 145 mEq/L    Potassium 3.7  3.5 - 5.1  mEq/L    Chloride 103  96 - 112 mEq/L    CO2 22  19 - 32 mEq/L    Glucose, Bld 134 (*) 70 - 99 mg/dL    BUN 25 (*) 6 - 23 mg/dL    Creatinine, Ser 1.61  0.50 - 1.10 mg/dL    Calcium 8.9  8.4 - 09.6 mg/dL    GFR calc non Af Amer 84 (*) >90 mL/min    GFR calc Af Amer >90  >90 mL/min   URINALYSIS, ROUTINE W REFLEX MICROSCOPIC     Status: Abnormal   Collection Time   11/25/11  2:21 AM      Component Value Range Comment   Color, Urine AMBER (*) YELLOW BIOCHEMICALS MAY BE AFFECTED BY COLOR   APPearance CLOUDY (*) CLEAR    Specific Gravity, Urine 1.031 (*) 1.005 - 1.030    pH 6.0  5.0 - 8.0    Glucose, UA NEGATIVE  NEGATIVE mg/dL    Hgb urine dipstick TRACE (*) NEGATIVE    Bilirubin Urine SMALL (*) NEGATIVE    Ketones, ur TRACE (*) NEGATIVE mg/dL    Protein, ur 045 (*) NEGATIVE mg/dL    Urobilinogen, UA 1.0  0.0 - 1.0 mg/dL    Nitrite NEGATIVE  NEGATIVE    Leukocytes, UA NEGATIVE  NEGATIVE   URINE MICROSCOPIC-ADD ON     Status: Abnormal   Collection Time   11/25/11  2:21 AM       Component Value Range Comment   Squamous Epithelial / LPF FEW (*) RARE    WBC, UA 0-2  <3 WBC/hpf    RBC / HPF 0-2  <3 RBC/hpf    Bacteria, UA FEW (*) RARE    Crystals CA OXALATE CRYSTALS (*) NEGATIVE    Urine-Other MUCOUS PRESENT     TROPONIN I     Status: Abnormal   Collection Time   11/25/11  9:20 AM      Component Value Range Comment   Troponin I 0.35 (*) <0.30 ng/mL   PRO B NATRIURETIC PEPTIDE     Status: Abnormal   Collection Time   11/25/11  9:20 AM      Component Value Range Comment   Pro B Natriuretic peptide (BNP) 3056.0 (*) 0 - 450 pg/mL   CARDIAC PANEL(CRET KIN+CKTOT+MB+TROPI)     Status: Abnormal   Collection Time   11/25/11  2:47 PM      Component Value Range Comment   Total CK 393 (*) 7 - 177 U/L    CK, MB 9.8 (*) 0.3 - 4.0 ng/mL    Troponin I 0.45 (*) <0.30 ng/mL    Relative Index 2.5  0.0 - 2.5   PROTIME-INR     Status: Normal   Collection Time   11/25/11  2:47 PM      Component Value Range Comment   Prothrombin Time 13.6  11.6 - 15.2 seconds    INR 1.02  0.00 - 1.49   CBC     Status: Abnormal   Collection Time   11/25/11  2:47 PM      Component Value Range Comment   WBC 5.8  4.0 - 10.5 K/uL    RBC 4.84  3.87 - 5.11 MIL/uL    Hemoglobin 10.0 (*) 12.0 - 15.0 g/dL    HCT 40.9 (*) 81.1 - 46.0 %    MCV 63.8 (*) 78.0 - 100.0 fL    MCH 20.7 (*) 26.0 - 34.0 pg    MCHC  32.4  30.0 - 36.0 g/dL    RDW 52.8 (*) 41.3 - 15.5 %    Platelets 314  150 - 400 K/uL   CREATININE, SERUM     Status: Abnormal   Collection Time   11/25/11  2:47 PM      Component Value Range Comment   Creatinine, Ser 0.55  0.50 - 1.10 mg/dL    GFR calc non Af Amer 87 (*) >90 mL/min    GFR calc Af Amer >90  >90 mL/min     Dg Chest 2 View  11/25/2011  *RADIOLOGY REPORT*  Clinical Data: Hypoxia and hypertension.  CHEST - 2 VIEW  Comparison: 09/20/2010  Findings: Two views of the chest demonstrate chronic elevation of the right hemidiaphragm.  There are interstitial lung densities bilaterally,  right side greater than left.  Heart size is within normal limits and stable.  Stable electronic device overlying the left side of the chest.  No evidence for pleural effusions. Mild scoliosis of the thoracic spine.  IMPRESSION: There are bilateral interstitial lung densities, right side greater than right.  Differential diagnosis includes asymmetrically edema versus atypical infection.  Original Report Authenticated By: Richarda Overlie, M.D.   Dg Lumbar Spine Complete  11/25/2011  *RADIOLOGY REPORT*  Clinical Data: The low back pain and hip pain for several weeks. No known injury.  LUMBAR SPINE - COMPLETE 4+ VIEW  Comparison: MRI lumbar spine 05/10/2008  Findings: Five lumbar type vertebral bodies.  Mild anterior subluxation of L4 on L5, similar to previous study.  Otherwise normal alignment of the lumbar vertebrae and facet joints.  Diffuse bone demineralization with mild ballooning of interspaces suggesting osteoporosis.  No change since previous study.  No vertebral compression deformities.  Intervertebral disc space heights are preserved.  Bone cortex and trabecular architecture appear intact.  No focal bone lesion or bone destruction.  Vascular calcifications.  Right upper quadrant calcifications likely represent renal stone or gallstones.  IMPRESSION: Diffuse demineralization suggesting osteoporosis.  Stable mild anterior subluxation of L4 on L5.  No acute bony abnormalities are appreciated.  Original Report Authenticated By: Marlon Pel, M.D.   Dg Hip Complete Left  11/25/2011  *RADIOLOGY REPORT*  Clinical Data: Left hip pain for several weeks.  No known injury.  LEFT HIP - COMPLETE 2+ VIEW  Comparison: None.  Findings: Mild degenerative changes in both hips.  The left hip appears intact.  No evidence of acute fracture or subluxation.  No focal bone lesion or bone destruction.  Bone cortex and trabecular architecture appear intact.  The pelvis, sacrum, SI joints, and symphysis pubis appear intact.   Calcifications in the pelvis consistent with phleboliths.  IMPRESSION: Degenerative changes in the hips.  No acute bony abnormalities appreciated.  Original Report Authenticated By: Marlon Pel, M.D.    Review of Systems  Constitutional: Positive for chills and malaise/fatigue. Negative for fever and weight loss.  Respiratory: Positive for cough and shortness of breath. Negative for hemoptysis and wheezing.   Cardiovascular: Positive for chest pain. Negative for palpitations, orthopnea, claudication and leg swelling.  Gastrointestinal: Positive for nausea and vomiting. Negative for abdominal pain.  Neurological: Positive for weakness. Negative for dizziness.   Blood pressure 151/82, pulse 82, temperature 97.7 F (36.5 C), temperature source Oral, resp. rate 25, height 4\' 11"  (1.499 m), weight 43.9 kg (96 lb 12.5 oz), SpO2 95.00%. Physical Exam  Constitutional: She is oriented to person, place, and time.  HENT:  Head: Normocephalic.  Eyes: Conjunctivae are normal. Left  eye exhibits no discharge. No scleral icterus.  Neck: Normal range of motion. Neck supple. No JVD present. Tracheal deviation present. No thyromegaly present.  Cardiovascular: Normal rate and regular rhythm.  Exam reveals gallop (Soft S3 gallop noted).   Murmur (3/6 systolic murmur noted at the apex radiating to axilla) heard. Respiratory: Effort normal.       Decreased breath sound at bases with bibasilar rales and occasional rhonchi noted  GI: Soft. Bowel sounds are normal. She exhibits no distension. There is tenderness (Mild epigastric tenderness noted). There is no rebound and no guarding.  Musculoskeletal: She exhibits no edema and no tenderness.  Lymphadenopathy:    She has no cervical adenopathy.  Neurological: She is alert and oriented to person, place, and time.    Assessment/Plan: Probable small non-Q-wave myocardial infarction Mild decompensated heart failure secondary to valvular heart  disease Bronchitis rule out pneumonia Hypertension History of syncope in the past Dementia History of questionable seizure disorder Chronic anemia GERD  Plan Agree with present management Check serial enzymes and EKG Check 2-D echo  check lipid panel Will add low-dose nitrates Discussed with patient and her daughter regarding minimally elevated cardiac enzymes and medical management versus invasive left cath possible PTCA stenting its risk and benefits i.e. death MI stroke need for emergency CABG local last complications etc. and consents for PCI.            Jermale Crass N 11/25/2011, 6:00 PM

## 2011-11-25 NOTE — Progress Notes (Signed)
*  PRELIMINARY RESULTS* Echocardiogram 2D Echocardiogram has been performed.  [name] 11/25/2011, 4:37 PM

## 2011-11-25 NOTE — ED Notes (Signed)
Pt able to ambulate in hall with one person assist.

## 2011-11-25 NOTE — ED Provider Notes (Signed)
Medical screening examination/treatment/procedure(s) were conducted as a shared visit with non-physician practitioner(s) and myself.  I personally evaluated the patient during the encounter  7:25 AM Frail appearing elderly female in no acute distress. Friendly and conversant. Left hip tender to palpation and movement. Chest x-ray pending.  Hanley Seamen, MD 11/25/11 [phone]

## 2011-11-25 NOTE — ED Provider Notes (Signed)
Pt with L hip pain, reproducible on L hip flexion/extension.  Pt also has intermittent bouts of decreased sats to 91% on 2L although denies any respiratory symptom.  O2 sat drops to 80s on RA, and pt c/o mild sob. Her CXR shows bilateral interstitial lung densities with ddx between edema vs atypical infection.  Pt denies fever, but does endorse a mild cough for the past few days.  Will call for admission for further evaluation.    8:58 AM i have consulted with Dr. Cena Benton from [facility], who agrees to see patient in the ED for further evaluation and possible admission.     Date: 11/25/2011  Rate: 89  Rhythm: normal sinus rhythm  QRS Axis: normal  Intervals: QT prolonged  ST/T Wave abnormalities: indeterminate, ST-T wave changes  Conduction Disutrbances:right bundle branch block  Narrative Interpretation:   Old EKG Reviewed: unchanged  10:10 AM Her EKG shows no significant changes, troponin is 0.35. [facility] will admit this patient.  Result notified to my attending.  No significant risk factors for PE.  Low suspicion for PE at this time.  No active chest pain.  Pt otherwise only complaining of her L hip hurting.  Abd exam reveals no abd bruits or obvious aortic enlargement on palpation.  No lower extremity edema.    Fayrene Helper, PA-C 11/25/11 1122

## 2011-11-25 NOTE — ED Provider Notes (Signed)
Medical screening examination/treatment/procedure(s) were performed by non-physician practitioner and as supervising physician I was immediately available for consultation/collaboration.   Rheba Diamond, MD 11/25/11 1558 

## 2011-11-26 ENCOUNTER — Encounter (HOSPITAL_COMMUNITY)
Admission: EM | Disposition: A | Discharge status: Skilled Nursing Facility | Payer: Self-pay | Source: Home / Self Care | Attending: Family Medicine

## 2011-11-26 HISTORY — PX: LEFT HEART CATHETERIZATION WITH CORONARY ANGIOGRAM: SHX5451

## 2011-11-26 LAB — CBC
Hemoglobin: 9.8 g/dL — ABNORMAL LOW (ref 12.0–15.0)
MCH: 20.3 pg — ABNORMAL LOW (ref 26.0–34.0)
Platelets: 295 10*3/uL (ref 150–400)
RBC: 4.82 MIL/uL (ref 3.87–5.11)
WBC: 7.3 10*3/uL (ref 4.0–10.5)

## 2011-11-26 LAB — BASIC METABOLIC PANEL
CO2: 23 mEq/L (ref 19–32)
Calcium: 8.7 mg/dL (ref 8.4–10.5)
Chloride: 102 mEq/L (ref 96–112)
Glucose, Bld: 126 mg/dL — ABNORMAL HIGH (ref 70–99)
Potassium: 4.2 mEq/L (ref 3.5–5.1)
Sodium: 135 mEq/L (ref 135–145)

## 2011-11-26 LAB — CARDIAC PANEL(CRET KIN+CKTOT+MB+TROPI)
CK, MB: 7.4 ng/mL (ref 0.3–4.0)
Relative Index: 2.9 — ABNORMAL HIGH (ref 0.0–2.5)

## 2011-11-26 LAB — LIPID PANEL
Cholesterol: 133 mg/dL (ref 0–200)
Total CHOL/HDL Ratio: 3.2 RATIO
VLDL: 19 mg/dL (ref 0–40)

## 2011-11-26 SURGERY — LEFT HEART CATHETERIZATION WITH CORONARY ANGIOGRAM
Anesthesia: LOCAL

## 2011-11-26 MED ORDER — ASPIRIN 81 MG PO CHEW: 81.0000 mg | CHEWABLE_TABLET | Freq: Every day | ORAL | Status: DC

## 2011-11-26 MED ORDER — ONDANSETRON HCL 4 MG/2ML IJ SOLN: 4.0000 mg | Freq: Four times a day (QID) | INTRAMUSCULAR | Status: DC | PRN

## 2011-11-26 MED ORDER — ACETAMINOPHEN 325 MG PO TABS: 650.0000 mg | ORAL_TABLET | ORAL | Status: DC | PRN

## 2011-11-26 MED ORDER — LIDOCAINE HCL (PF) 1 % IJ SOLN
INTRAMUSCULAR | Status: AC
  Filled 2011-11-26: qty 30

## 2011-11-26 MED ORDER — ALUM & MAG HYDROXIDE-SIMETH 200-200-20 MG/5ML PO SUSP
ORAL | Status: AC
  Filled 2011-11-26: qty 30

## 2011-11-26 MED ORDER — NITROGLYCERIN 0.2 MG/ML ON CALL CATH LAB
INTRAVENOUS | Status: AC
  Filled 2011-11-26: qty 1

## 2011-11-26 MED ORDER — FENTANYL CITRATE 0.05 MG/ML IJ SOLN
INTRAMUSCULAR | Status: AC
  Filled 2011-11-26: qty 2

## 2011-11-26 MED ORDER — MAGNESIUM HYDROXIDE 400 MG/5ML PO SUSP: 30.0000 mL | Freq: Every day | ORAL | Status: DC | PRN

## 2011-11-26 MED ORDER — FAMOTIDINE IN NACL 20-0.9 MG/50ML-% IV SOLN
INTRAVENOUS | Status: AC
  Filled 2011-11-26: qty 50

## 2011-11-26 MED ORDER — MORPHINE SULFATE 2 MG/ML IJ SOLN
1.0000 mg | INTRAMUSCULAR | Status: DC | PRN
  Administered 2011-11-26 (×2): 2 mg via INTRAVENOUS
  Administered 2011-11-27: 1 mg via INTRAVENOUS
  Administered 2011-11-28 (×3): 2 mg via INTRAVENOUS
  Administered 2011-11-28: 1 mg via INTRAVENOUS
  Administered 2011-11-29 – 2011-12-05 (×13): 2 mg via INTRAVENOUS
  Administered 2011-12-07 (×2): 1 mg via INTRAVENOUS
  Filled 2011-11-26 (×21): qty 1

## 2011-11-26 MED ORDER — SODIUM CHLORIDE 0.9 % IV SOLN
INTRAVENOUS | Status: AC
  Administered 2011-11-26: 13:00:00 via INTRAVENOUS

## 2011-11-26 MED ORDER — GUAIFENESIN-DM 100-10 MG/5ML PO SYRP
15.0000 mL | ORAL_SOLUTION | ORAL | Status: DC | PRN
  Filled 2011-11-26: qty 15

## 2011-11-26 MED ORDER — HEPARIN (PORCINE) IN NACL 2-0.9 UNIT/ML-% IJ SOLN
INTRAMUSCULAR | Status: AC
  Filled 2011-11-26: qty 2000

## 2011-11-26 MED ORDER — ALUM & MAG HYDROXIDE-SIMETH 200-200-20 MG/5ML PO SUSP
15.0000 mL | ORAL | Status: DC | PRN
  Administered 2011-11-27 (×2): 30 mL via ORAL
  Filled 2011-11-26 (×3): qty 30

## 2011-11-26 NOTE — Cardiovascular Report (Signed)
NAMEJULANNE, SCHLUETER NO.:  [id number]  MEDICAL RECORD NO.:  [id number]  LOCATION:  2504                         FACILITY:  [facility]  PHYSICIAN:  Eduardo Osier. Sharyn Lull, M.D. DATE OF BIRTH:  11-11-1929  DATE OF PROCEDURE:  11/26/2011 DATE OF DISCHARGE:                           CARDIAC CATHETERIZATION   PROCEDURE:  Left cardiac cath with selective left and right coronary angiography, left ventriculography via right groin using Judkins technique.  INDICATION FOR THE PROCEDURE:  [name] is a 78 year old female with past medical history significant for coronary artery disease; history of non-Q-wave myocardial infarction in the past; hypertension; history of recurrent syncope, status post loop recorder; GERD; dementia; chronic anemia; valvular heart disease; degenerative joint disease; history of questionable seizure disorder, was admitted earlier yesterday because of vague left-sided chest pain associated with shortness of breath and hypoxia.  The patient also complains of cough with whitish yellow phlegm.  Denies any fever or chills.  The complains of musculoskeletal pain in the back and hip.  Denies history of trauma or fall.  Cardiology consultation was called as the patient was noted to have mildly elevated CPK, MB and troponin-I.  Her CK total was 393, MB of 9.8.  Second set CK was 341, MB 9.3, troponin-I was 0.35, 0.45, and 0.48.  Due to chest pain, elevated cardiac enzymes, and multiple risk factors, I discussed with the patient and her daughter at length regarding left cath, possible PTCA stenting, its risks and benefits, i.e., death, MI, stroke, need for emergency CABG, local vascular complications, etc., and consented for procedure.  PROCEDURE:  After obtaining the informed consent, the patient was brought to the Cath Lab and was placed on the fluoroscopy table.  Right groin was prepped and draped in usual fashion.  Xylocaine 1% was used for local anesthesia  in the right groin.  With the help of thin-wall needle, 6-French arterial sheath was placed.  The sheath was aspirated and flushed.  Next, 6-French left Judkins catheter was advanced over the wire under fluoroscopic guidance up to the ascending aorta.  Wire was pulled out, the catheter was aspirated and connected to the Manifold. Catheter was further advanced and engaged into left coronary ostium. Multiple views of the left system were taken.  Next, catheter was disengaged and was pulled out over the wire and was replaced with 6- [place] right Judkins catheter, which was advanced over the wire under fluoroscopic guidance up to the ascending aorta.  Wire was pulled out, the catheter was aspirated and connected to the Manifold.  Catheter was further advanced and engaged into right coronary ostium.  Multiple views of the right system were taken.  Next, catheter was disengaged and was pulled out over the wire and was replaced with 6-French pigtail catheter, which was advanced over the wire under fluoroscopic guidance up to the ascending aorta.  Wire was pulled out, the catheter was aspirated and connected to the Manifold.  Catheter was further advanced across the aortic valve into the LV.  LV pressures were recorded.  Next, left ventriculography was done in 30-degree RAO position.  Post- angiographic pressures were recorded from LV and then pullback pressures were recorded from the aorta.  There was no gradient across the aortic valve.  Next, the pigtail catheter was pulled out over the wire. Sheaths were aspirated and flushed.  FINDINGS:  LV showed good LV systolic function, mild-to-moderate LVH. There was 2+ MR, EF of 55-60%.  Left main had 5-10% distal stenosis. LAD has 10-15% proximal and mid-stenosis.  Diagonal 1 was very small. Diagonal 2 was small, which has 30-40% ostial stenosis.  Ramus is moderate size, which has mild disease.  Left circumflex has 5-10% ostial stenosis.  OM 1 is  large, which is patent. RCA has 10-15% mid-stenosis.  PDA and PLV branches were patent.  The patient tolerated the procedure well.  There were no complications.  The patient was transferred to recovery room in stable condition.     Eduardo Osier. Sharyn Lull, M.D.     MNH/MEDQ  D:  11/26/2011  T:  11/26/2011  Job:  [id number]

## 2011-11-26 NOTE — Clinical Documentation Improvement (Signed)
CHF DOCUMENTATION CLARIFICATION QUERY  THIS DOCUMENT IS NOT A PERMANENT PART OF THE MEDICAL RECORD  TO RESPOND TO THE THIS QUERY, FOLLOW THE INSTRUCTIONS BELOW:  1. If needed, update documentation for the patient's encounter via the notes activity.  2. Access this query again and click edit on the In [employer].  3. After updating, or not, click F2 to complete all highlighted (required) fields concerning your review. Select "additional documentation in the medical record" OR "no additional documentation provided".  4. Click Sign note button.  5. The deficiency will fall out of your In Basket *Please let us know if you are not able to complete this workflow by phone or e-mail (listed below).  Please update your documentation within the medical record to reflect your response to this query.                                                                                    11/26/11  Dear Dr.[facility],  In a better effort to capture your patient's severity of illness, reflect appropriate length of stay and utilization of resources, a review of the patient medical record has revealed the following indicators the diagnosis of Heart Failure.    Based on your clinical judgment, please clarify and document in a progress note and/or discharge summary the clinical condition associated with the following supporting information:  In responding to this query please exercise your independent judgment.  The fact that a query is asked, does not imply that any particular answer is desired or expected. 11/25/11 Cons note..."Mild decompensated heart failure secondary to valvular heart disease." For accurate Dx specificity & severity can noted "HF" be further specified w/ type & acuity. Thank you  Possible Clinical Conditions? . CHF NOS . Left Heart Failure . Systolic or Diastolic Congestive Heart Failure . Systolic & Diastolic Congestive Heart Failure  . Chronic Systolic or Diastolic  Congestive Heart Failure . Chronic Systolic & Diastolic Congestive Heart Failure  . Acute Systolic or Diastolic Congestive Heart Failure . Acute Systolic & Diastolic Congestive Heart Failure  . Acute on Chronic Systolicor Diastolic Congestive Heart Failure . Acute on Chronic Systolic & Diastolic Congestive Heart Failure  . Other Condition . Cannot Clinically Determine  Supporting Information: Risk Factors: 11/25/11 cons note.Marland KitchenMarland Kitchen"Patient is 78 year old female with past medical history significant for coronary artery disease history of non-Q-wave myocardial infarction in the past hypertension history of recurrent syncope status post loop recorder..."  Signs & Symptoms: see above note  Diagnostics: 11/25/11: Pro B Natriuretic peptide (BNP) 3056.0 (*)  CARDIAC PANEL:  11/25/11 2:47 PM  Total CK 393 (*)  CK, MB 9.8 (*)  Troponin I 0.45 (*)  Relative Index 2.5   Echo results: 11/25/11: results pending  EKG: 11/25/11; NSR  Radiology: 11/25/11 CXR: IMPRESSION: There are bilateral interstitial lung densities, right side greater than right. Differential diagnosis includes asymmetrically edema versus atypical infection.   Treatment: 11/25/11 Cons note.Marland KitchenMarland Kitchen"Plan: Agree with present management; Check serial enzymes and EKG;Check 2-D echo; check lipid panel; Will add low-dose nitrates..."   Reviewed:  no additional documentation provided:11/29/11>no add doc per pn.ORM  Thank You,  Toribio Harbour, RN, BSN, CCDS  Certified Clinical Documentation Specialist Pager: [phone]  Health Information Management [place]

## 2011-11-26 NOTE — Progress Notes (Signed)
Subjective:  Patient denies any further chest pain. Complains of vague abdominal pain states has not had BM for last 4 days  Objective:  Vital Signs in the last 24 hours: Temp:  [97.2 F (36.2 C)-98.9 F (37.2 C)] 97.2 F (36.2 C) (06/28 0700) Pulse Rate:  [74-82] 74  (06/28 0700) Resp:  [18-25] 18  (06/28 0700) BP: (140-151)/(82-88) 146/85 mmHg (06/28 0700) SpO2:  [91 %-98 %] 91 % (06/28 0700) Weight:  [43.9 kg (96 lb 12.5 oz)-44.6 kg (98 lb 5.2 oz)] 44.6 kg (98 lb 5.2 oz) (06/28 0700)  Intake/Output from previous day: 06/27 0701 - 06/28 0700 In: 550 [P.O.:400; I.V.:150] Out: 190 [Urine:190] Intake/Output from this shift:    Physical Exam: Neck: no adenopathy, no carotid bruit, no JVD and supple, symmetrical, trachea midline Lungs: Decreased breath sound at bases with occasional rhonchi and rales Heart: regular rate and rhythm, S1, S2 normal and 3/6 systolic murmur noted Abdomen: Soft bowel sounds present mild epigastric tenderness no guarding Extremities: extremities normal, atraumatic, no cyanosis or edema  Lab Results:  Basename 11/26/11 0202 11/25/11 1447  WBC 7.3 5.8  HGB 9.8* 10.0*  PLT 295 314    Basename 11/26/11 0202 11/25/11 1447 11/25/11 0135  NA 135 -- 137  K 4.2 -- 3.7  CL 102 -- 103  CO2 23 -- 22  GLUCOSE 126* -- 134*  BUN 26* -- 25*  CREATININE 0.55 0.55 --    Basename 11/26/11 0202 11/25/11 1940  TROPONINI 0.45* 0.48*   Hepatic Function Panel No results found for this basename: PROT,ALBUMIN,AST,ALT,ALKPHOS,BILITOT,BILIDIR,IBILI in the last 72 hours  Basename 11/26/11 0202  CHOL 133   No results found for this basename: PROTIME in the last 72 hours  Imaging: Imaging results have been reviewed and Dg Chest 2 View  11/25/2011  *RADIOLOGY REPORT*  Clinical Data: Hypoxia and hypertension.  CHEST - 2 VIEW  Comparison: 09/20/2010  Findings: Two views of the chest demonstrate chronic elevation of the right hemidiaphragm.  There are interstitial  lung densities bilaterally, right side greater than left.  Heart size is within normal limits and stable.  Stable electronic device overlying the left side of the chest.  No evidence for pleural effusions. Mild scoliosis of the thoracic spine.  IMPRESSION: There are bilateral interstitial lung densities, right side greater than right.  Differential diagnosis includes asymmetrically edema versus atypical infection.  Original Report Authenticated By: Richarda Overlie, M.D.   Dg Lumbar Spine Complete  11/25/2011  *RADIOLOGY REPORT*  Clinical Data: The low back pain and hip pain for several weeks. No known injury.  LUMBAR SPINE - COMPLETE 4+ VIEW  Comparison: MRI lumbar spine 05/10/2008  Findings: Five lumbar type vertebral bodies.  Mild anterior subluxation of L4 on L5, similar to previous study.  Otherwise normal alignment of the lumbar vertebrae and facet joints.  Diffuse bone demineralization with mild ballooning of interspaces suggesting osteoporosis.  No change since previous study.  No vertebral compression deformities.  Intervertebral disc space heights are preserved.  Bone cortex and trabecular architecture appear intact.  No focal bone lesion or bone destruction.  Vascular calcifications.  Right upper quadrant calcifications likely represent renal stone or gallstones.  IMPRESSION: Diffuse demineralization suggesting osteoporosis.  Stable mild anterior subluxation of L4 on L5.  No acute bony abnormalities are appreciated.  Original Report Authenticated By: Marlon Pel, M.D.   Dg Hip Complete Left  11/25/2011  *RADIOLOGY REPORT*  Clinical Data: Left hip pain for several weeks.  No known injury.  LEFT HIP - COMPLETE 2+ VIEW  Comparison: None.  Findings: Mild degenerative changes in both hips.  The left hip appears intact.  No evidence of acute fracture or subluxation.  No focal bone lesion or bone destruction.  Bone cortex and trabecular architecture appear intact.  The pelvis, sacrum, SI joints, and  symphysis pubis appear intact.  Calcifications in the pelvis consistent with phleboliths.  IMPRESSION: Degenerative changes in the hips.  No acute bony abnormalities appreciated.  Original Report Authenticated By: Marlon Pel, M.D.    Cardiac Studies:  Assessment/Plan:  Probable small non-Q-wave myocardial infarction  Mild decompensated heart failure secondary to valvular heart disease  Bronchitis rule out pneumonia  Hypertension  History of syncope in the past  Dementia  History of questionable seizure disorder  Chronic anemia  Abdominal pain rule out peptic ulcer disease rule out gastritis GERD  Plan Add Pepcid per orders Schedule for left cath possible PTCA stenting discussed with family at length its risk and benefits and consents for PCI  LOS: 2 days    Vidhi Delellis N 11/26/2011, 11:10 AM

## 2011-11-26 NOTE — Progress Notes (Addendum)
Received to cath lab holding area via [facility]. BP 142/100 HR 74 NSR RR 20 with 4L 02 via nasal cannula with sat of 99%. Complains of hip pain, comfort measures provided. Family in. 0.9% normal saline via right AC @ 75cc/hr

## 2011-11-26 NOTE — CV Procedure (Signed)
Her left cardiac cath was dictated on 11/26/2011 dictation number is [phone]

## 2011-11-26 NOTE — Progress Notes (Signed)
Now complaining of upper epigastric pain, tender on palpation, minimal bowel sounds will not administer ASA until speaking with Dr. Sharyn Lull. BP 142/96 HR 72 NSR RR 20 O2 sat 98%.

## 2011-11-26 NOTE — Progress Notes (Signed)
Utilization Review Completed.  [name] T  11/26/2011  

## 2011-11-26 NOTE — Progress Notes (Signed)
   CARE MANAGEMENT NOTE 11/26/2011  Patient:  [name], [name]   Account Number:  [id number]  Date Initiated:  11/26/2011  Documentation initiated by:  Jiles Crocker  Subjective/Objective Assessment:   ADMITTED WITH ?NONSMALL WAVE MI; BRONCHITIS RULE OUT PNEUMONIA     Action/Plan:   PCP: Dr. Sharyn Lull; LIVES ALONE; FOR POSSIBLE PCI;   Anticipated DC Date:  11/29/2011   Anticipated DC Plan:  HOME W HOME HEALTH SERVICES      DC Planning Services  CM consult               Status of service:  In process, will continue to follow Medicare Important Message given?  NA - LOS <3 / Initial given by admissions (If response is "NO", the following Medicare IM given date fields will be blank)  Per UR Regulation:  Reviewed for med. necessity/level of care/duration of stay Comments:  11/26/2011- B Chellsie Gomer RN, BSN, MHA

## 2011-11-27 LAB — CBC
HCT: 31 % — ABNORMAL LOW (ref 36.0–46.0)
MCHC: 32.6 g/dL (ref 30.0–36.0)
Platelets: 254 10*3/uL (ref 150–400)
RDW: 17.8 % — ABNORMAL HIGH (ref 11.5–15.5)

## 2011-11-27 LAB — BASIC METABOLIC PANEL
BUN: 32 mg/dL — ABNORMAL HIGH (ref 6–23)
Creatinine, Ser: 0.59 mg/dL (ref 0.50–1.10)
GFR calc Af Amer: >90 mL/min (ref >90)
GFR calc non Af Amer: 85 mL/min — ABNORMAL LOW (ref >90)
Potassium: 4.3 mEq/L (ref 3.5–5.1)

## 2011-11-27 MED ORDER — MAGNESIUM HYDROXIDE 400 MG/5ML PO SUSP
30.0000 mL | Freq: Every day | ORAL | Status: DC | PRN
  Administered 2011-11-28: 30 mL via ORAL
  Filled 2011-11-27: qty 30

## 2011-11-27 MED ORDER — RAMIPRIL 2.5 MG PO CAPS
2.5000 mg | ORAL_CAPSULE | Freq: Every day | ORAL | Status: DC
  Administered 2011-11-27 – 2011-11-28 (×2): 2.5 mg via ORAL
  Filled 2011-11-27 (×2): qty 1

## 2011-11-27 MED ORDER — FUROSEMIDE 10 MG/ML IJ SOLN
20.0000 mg | Freq: Once | INTRAMUSCULAR | Status: DC
  Filled 2011-11-27: qty 2

## 2011-11-27 MED ORDER — WHITE PETROLATUM GEL
Status: AC
  Administered 2011-11-27: 0.2
  Filled 2011-11-27: qty 5

## 2011-11-27 MED ORDER — ENOXAPARIN SODIUM 30 MG/0.3ML ~~LOC~~ SOLN
30.0000 mg | SUBCUTANEOUS | Status: DC
  Administered 2011-11-27 – 2011-11-30 (×4): 30 mg via SUBCUTANEOUS
  Filled 2011-11-27 (×5): qty 0.3

## 2011-11-27 NOTE — Progress Notes (Signed)
11/28/11 2345  Six-second run of SVT noted on monitor and Dr. Sharyn Lull notified of this event as well as a 12 second run of SVT at 2039.  Patient was asymptomatic and sleeping during both events.  VSS.  Patient's grandson at bedside and patient in no acute distress, denies complaints.  Will check BMET and Mag in am per Dr. Annitta Jersey order.  [name]

## 2011-11-27 NOTE — Progress Notes (Signed)
Report called to [facility] on 3700, to be transferred to 3704 via bed, remains with nausea?  Patient does not verbalize nausea just grabs abdomen ann turns to left side, bowel sounds are present but hypoactive, incontinent of small amount of urine, appears volume depleted, family at bedside, took only sips of gingerale for breakfast, will continue to assess situation and patient condition, rt groin level 0, no c/o chest pain, emesis during the hs had chunks of partially undigested food, family aware of transfer, also spoke with Dr. Sharyn Lull via telephone and informed of new room number, Berle Mull RN

## 2011-11-27 NOTE — Progress Notes (Signed)
Subjective:  Patient denies any chest pain complains of coughing and shortness of breath. Also complains of vague abdominal pain associated with nausea. Patient had small BM after enema  Objective:  Vital Signs in the last 24 hours: Temp:  [97.9 F (36.6 C)-99.3 F (37.4 C)] 99.3 F (37.4 C) (06/29 0832) Pulse Rate:  [73-88] 76  (06/29 1025) Resp:  [16-20] 20  (06/29 1025) BP: (131-155)/(75-101) 155/89 mmHg (06/29 1025) SpO2:  [85 %-100 %] 97 % (06/29 1025) Weight:  [38.5 kg (84 lb 14 oz)-43.4 kg (95 lb 10.9 oz)] 43.4 kg (95 lb 10.9 oz) (06/29 1025)  Intake/Output from previous day: 06/28 0701 - 06/29 0700 In: 513.3 [P.O.:60; I.V.:453.3] Out: -  Intake/Output from this shift: Total I/O In: 60 [P.O.:60] Out: -   Physical Exam: Neck: no adenopathy, no carotid bruit, no JVD and supple, symmetrical, trachea midline Lungs: Decrease vessel at bases with bilateral rhonchi and rales Heart: regular rate and rhythm, S1, S2 normal and 2/6 systolic murmur and soft S3 gallop noted Abdomen: soft, non-tender; bowel sounds normal; no masses,  no organomegaly Extremities: extremities normal, atraumatic, no cyanosis or edema and Right groin stable  Lab Results:  Basename 11/27/11 0628 11/26/11 0202  WBC 6.3 7.3  HGB 10.1* 9.8*  PLT 254 295    Basename 11/27/11 0628 11/26/11 0202  NA 137 135  K 4.3 4.2  CL 102 102  CO2 22 23  GLUCOSE 118* 126*  BUN 32* 26*  CREATININE 0.59 0.55    Basename 11/26/11 0202 11/25/11 1940  TROPONINI 0.45* 0.48*   Hepatic Function Panel No results found for this basename: PROT,ALBUMIN,AST,ALT,ALKPHOS,BILITOT,BILIDIR,IBILI in the last 72 hours  Basename 11/26/11 0202  CHOL 133   No results found for this basename: PROTIME in the last 72 hours  Imaging: Imaging results have been reviewed and No results found.  Cardiac Studies:  Assessment/Plan:  Probable small non-Q-wave myocardial infarction  Mild decompensated heart failure secondary to  valvular heart disease  Bilateral pneumonia Hypertension  History of syncope in the past  Dementia  History of questionable seizure disorder  Chronic anemia  Abdominal pain rule out peptic ulcer disease rule out gastritis  GERD  Plan As per orders Check labs in a.m.  LOS: 3 days    [name] N 11/27/2011, 12:42 PM

## 2011-11-28 ENCOUNTER — Inpatient Hospital Stay (HOSPITAL_COMMUNITY): Payer: Medicare Other

## 2011-11-28 LAB — BASIC METABOLIC PANEL
Calcium: 9 mg/dL (ref 8.4–10.5)
Creatinine, Ser: 0.69 mg/dL (ref 0.50–1.10)
GFR calc Af Amer: >90 mL/min (ref >90)

## 2011-11-28 LAB — MAGNESIUM: Magnesium: 2.9 mg/dL — ABNORMAL HIGH (ref 1.5–2.5)

## 2011-11-28 MED ORDER — RAMIPRIL 5 MG PO CAPS
5.0000 mg | ORAL_CAPSULE | Freq: Every day | ORAL | Status: DC
  Administered 2011-12-05 – 2011-12-12 (×7): 5 mg via ORAL
  Filled 2011-11-28 (×15): qty 1

## 2011-11-28 MED ORDER — SUCRALFATE 1 GM/10ML PO SUSP
1.0000 g | Freq: Three times a day (TID) | ORAL | Status: DC
  Administered 2011-11-28 – 2011-12-15 (×44): 1 g via ORAL
  Filled 2011-11-28 (×72): qty 10

## 2011-11-28 NOTE — Progress Notes (Signed)
11/28/11 0440  Patient requesting IV pain medication for left side pain.  Patient's IV has been removed by patient and she has no IV access.  Attempted to restart IV x 2.  Unsuccessful x 2.  2nd nurse attempted restart x 1 and was also unsuccessful.  IV team notified.  Patient vomited ~500 mL of watery mustard colored bile emesis.  Awaiting IV restart by IV nurse in order to medicate patient.  Alonza Bogus

## 2011-11-28 NOTE — Progress Notes (Signed)
Subjective:  Patient complaints off shortness of breath and vague abdominal pain. Denies any fever or chills  Objective:  Vital Signs in the last 24 hours: Temp:  [97.1 F (36.2 C)-98.2 F (36.8 C)] 98.2 F (36.8 C) (06/30 0500) Pulse Rate:  [63-75] 68  (06/30 0950) Resp:  [18-20] 18  (06/30 0500) BP: (141-152)/(79-94) 141/94 mmHg (06/30 0950) SpO2:  [98 %-100 %] 99 % (06/30 0500) Weight:  [43.8 kg (96 lb 9 oz)] 43.8 kg (96 lb 9 oz) (06/30 0500)  Intake/Output from previous day: 06/29 0701 - 06/30 0700 In: 330 [P.O.:180; IV Piggyback:150] Out: 500 [Emesis/NG output:500] Intake/Output from this shift: Total I/O In: 3 [I.V.:3] Out: -   Physical Exam: Neck: no adenopathy, no carotid bruit, no JVD and supple, symmetrical, trachea midline Lungs: Bibasilar rhonchi and rales Heart: regular rate and rhythm, S1, S2 normal and Soft systolic murmur and S3 gallop noted Abdomen: soft, non-tender; bowel sounds normal; no masses,  no organomegaly Extremities: extremities normal, atraumatic, no cyanosis or edema  Lab Results:  Basename 11/27/11 0628 11/26/11 0202  WBC 6.3 7.3  HGB 10.1* 9.8*  PLT 254 295    Basename 11/28/11 0520 11/27/11 0628  NA 136 137  K 3.7 4.3  CL 99 102  CO2 27 22  GLUCOSE 92 118*  BUN 41* 32*  CREATININE 0.69 0.59    Basename 11/26/11 0202 11/25/11 1940  TROPONINI 0.45* 0.48*   Hepatic Function Panel No results found for this basename: PROT,ALBUMIN,AST,ALT,ALKPHOS,BILITOT,BILIDIR,IBILI in the last 72 hours  Basename 11/26/11 0202  CHOL 133   No results found for this basename: PROTIME in the last 72 hours  Imaging: Imaging results have been reviewed and No results found.  Cardiac Studies:  Assessment/Plan:  Probable small non-Q-wave myocardial infarction  Mild decompensated heart failure secondary to valvular heart disease  Bilateral pneumonia  Hypertension  History of syncope in the past  Dementia  History of questionable seizure  disorder  Chronic anemia  Abdominal pain rule out peptic ulcer disease rule out gastritis  GERD  Plan Add Carafate per orders Check chest x-ray in a.m. Increase ACE inhibitors as per orders  LOS: 4 days    Kmari Brian N 11/28/2011, 11:16 AM

## 2011-11-29 ENCOUNTER — Inpatient Hospital Stay (HOSPITAL_COMMUNITY): Payer: Medicare Other

## 2011-11-29 MED ORDER — PANTOPRAZOLE SODIUM 40 MG IV SOLR
40.0000 mg | INTRAVENOUS | Status: DC
  Administered 2011-11-30 – 2011-12-06 (×7): 40 mg via INTRAVENOUS
  Filled 2011-11-29 (×8): qty 40

## 2011-11-29 NOTE — Progress Notes (Signed)
Subjective:   patient denies any chest pain or shortness of breath. States her abdominal pain has improved after a low Gomco suction. Feels hungry  Objective:  Vital Signs in the last 24 hours: Temp:  [98.1 F (36.7 C)-98.2 F (36.8 C)] 98.2 F (36.8 C) (07/01 0500) Pulse Rate:  [60-84] 62  (07/01 0500) Resp:  [12-20] 16  (07/01 0500) BP: (133-169)/(60-84) 159/80 mmHg (07/01 0500) SpO2:  [97 %-100 %] 100 % (07/01 0500) Weight:  [43.4 kg (95 lb 10.9 oz)] 43.4 kg (95 lb 10.9 oz) (07/01 0500)  Intake/Output from previous day: 06/30 0701 - 07/01 0700 In: 63 [P.O.:60; I.V.:3] Out: -  Intake/Output from this shift:    Physical Exam: Neck: no adenopathy, no carotid bruit, no JVD and supple, symmetrical, trachea midline Lungs: Bibasilar rhonchi and and faint rales noted Heart: regular rate and rhythm, S1, S2 normal and Soft systolic murmur and S3 gallop noted Abdomen: Soft bowel sounds present mild epigastric tenderness no guarding Extremities: extremities normal, atraumatic, no cyanosis or edema  Lab Results:  Basename 11/27/11 0628  WBC 6.3  HGB 10.1*  PLT 254    Basename 11/28/11 0520 11/27/11 0628  NA 136 137  K 3.7 4.3  CL 99 102  CO2 27 22  GLUCOSE 92 118*  BUN 41* 32*  CREATININE 0.69 0.59   No results found for this basename: TROPONINI:2,CK,MB:2 in the last 72 hours Hepatic Function Panel No results found for this basename: PROT,ALBUMIN,AST,ALT,ALKPHOS,BILITOT,BILIDIR,IBILI in the last 72 hours No results found for this basename: CHOL in the last 72 hours No results found for this basename: PROTIME in the last 72 hours  Imaging: Imaging results have been reviewed and Dg Abd Acute W/chest  11/29/2011  *RADIOLOGY REPORT*  Clinical Data: Upper abdominal/mid chest pain, NG tube  ACUTE ABDOMEN SERIES (ABDOMEN 2 VIEW & CHEST 1 VIEW)  Comparison: 11/28/2011  Findings: Stable chronic interstitial opacities.  Volume loss on the right with elevation of the right  hemidiaphragm.  Mild bibasilar atelectasis.  No pleural effusion or pneumothorax.  The heart is top normal in size.  Loop recorder overlying the heart.  Two mildly prominent loops of bowel in the lower abdomen, grossly unchanged.  However, at least one of these loops appears to have incomplete folds, which suggests that it reflects colon.  Again seen are air-fluid levels on the decubitus view.  No free air.  This appearance is nonspecific, possibly reflecting adynamic ileus, although early/developing partial small bowel obstruction is not excluded.  Weighted feeding tube in the proximal gastric body.  Cholelithiasis.  Mild degenerative changes of the visualized thoracolumbar spine.  IMPRESSION: No evidence of acute cardiopulmonary disease.  Stable chronic interstitial opacities.  Mildly prominent loops of bowel in the lower abdomen, possibly reflecting adynamic ileus, early/developing partial small bowel obstruction not excluded.  No free air.  Weighted feeding tube in the proximal gastric body.  Original Report Authenticated By: Charline Bills, M.D.   Dg Abd Acute W/chest  11/28/2011  *RADIOLOGY REPORT*  Clinical Data: Nausea and vomiting.  ACUTE ABDOMEN SERIES (ABDOMEN 2 VIEW & CHEST 1 VIEW)  Comparison: 11/25/2011; 07/04/2006  Findings: Loop recorder noted.  Low lung volumes are present.  Chronic interstitial opacities noted in both lungs with indistinct bibasilar airspace opacities.  Levoconvex thoracic scoliosis noted.  The right hemidiaphragm is elevated and gallstones are noted.  Scattered air-fluid levels are present in loops of bowel likely reflecting dilated small bowel, and an abnormal but nonspecific appearance which could represent early  small bowel obstruction or ileus.  IMPRESSION:  1.  Dilated loops mildly dilated loops of small bowel with air- fluid levels, and an abnormal appearance which could reflect early obstruction or ileus. 2.  Bilateral basilar airspace opacities; pneumonia is not  excluded. 3.  Chronic elevation of the right hemidiaphragm. 4.  Chronic interstitial lung disease. 5.  Cholelithiasis.  Original Report Authenticated By: Dellia Cloud, M.D.    Cardiac Studies:  Assessment/Plan:  Status post Probable small non-Q-wave myocardial infarction  Mild decompensated heart failure secondary to valvular heart disease  Bilateral pneumonia  Hypertension  History of syncope in the past  Dementia  History of questionable seizure disorder  Chronic anemia  Ileus/early small bowel obstruction  GERD  Plan Continue low Gomco suction GI consult  LOS: 5 days    [name] 11/29/2011, 1:26 PM

## 2011-11-29 NOTE — Progress Notes (Signed)
Occupational Therapy Evaluation Patient Details Name: [name] MRN: [id number] DOB: 1929/07/12 Today's Date: 11/29/2011 Time: 0865-7846 OT Time Calculation (min): 16 min  OT Assessment / Plan / Recommendation Clinical Impression    78 y.o. pt. presented to the ED with left sided discomfort.  She was noted to be hypoxic especially with ambulation and with elevated troponins. Testing revealed a likely MI and PNA. Patient now with abdominal pain and work up for this is in progress. Pt. will benefit from OT to increase independence and safety in ADLs prior to discharge. OT to follow acutely.     OT Assessment  Patient needs continued OT Services    Follow Up Recommendations  Home health OT;Supervision/Assistance - 24 hour    Barriers to Discharge      Equipment Recommendations  Rolling walker with 5" wheels    Recommendations for Other Services    Frequency  Min 2X/week    Precautions / Restrictions Precautions Precautions: Fall   Pertinent Vitals/Pain Vitals Monitored and Stable. Pt. Reported pain in back but did not quantify.    ADL  Upper Body Bathing: Simulated;Set up Where Assessed - Upper Body Bathing: Supported sitting Lower Body Bathing: Simulated;Minimal assistance Where Assessed - Lower Body Bathing: Supported sitting Upper Body Dressing: Simulated;Set up Where Assessed - Upper Body Dressing: Supported sitting Lower Body Dressing: Performed;Minimal assistance Where Assessed - Lower Body Dressing: Supported sitting ADL Comments: Pt. required Min A with LB dressing while sitting supported due to vc's for problem solving to don sock and cross legs.     OT Diagnosis: Generalized weakness  OT Problem List: Decreased strength;Decreased activity tolerance;Impaired balance (sitting and/or standing);Decreased cognition;Pain OT Treatment Interventions: Self-care/ADL training;DME and/or AE instruction;Patient/family education;Balance training;Therapeutic activities   OT  Goals Acute Rehab OT Goals OT Goal Formulation: With patient/family Time For Goal Achievement: 12/13/11 Potential to Achieve Goals: Good ADL Goals Pt Will Perform Grooming: Standing at sink;with set-up ADL Goal: Grooming - Progress: Goal set today Pt Will Perform Upper Body Bathing: Sitting at sink;with set-up ADL Goal: Upper Body Bathing - Progress: Goal set today Pt Will Perform Lower Body Bathing: Sit to stand from chair;with set-up ADL Goal: Lower Body Bathing - Progress: Goal set today Pt Will Perform Upper Body Dressing: with set-up;Sitting, bed;Sitting, chair ADL Goal: Upper Body Dressing - Progress: Goal set today Pt Will Perform Lower Body Dressing: Sit to stand from bed;Sit to stand from chair;with set-up ADL Goal: Lower Body Dressing - Progress: Goal set today Pt Will Transfer to Toilet: 3-in-1;Ambulation;with supervision ADL Goal: Toilet Transfer - Progress: Goal set today Pt Will Perform Toileting - Clothing Manipulation: Standing;with supervision ADL Goal: Toileting - Clothing Manipulation - Progress: Goal set today Pt Will Perform Toileting - Hygiene: with modified independence;Leaning right and/or left on 3-in-1/toilet ADL Goal: Toileting - Hygiene - Progress: Goal set today Pt Will Perform Tub/Shower Transfer: Tub transfer;with supervision;Transfer tub bench ADL Goal: Tub/Shower Transfer - Progress: Goal set today  Visit Information  Last OT Received On: 11/29/11 Assistance Needed: +1    Subjective Data   "My back hurts."   Prior Functioning  Home Living Lives With: Alone Available Help at Discharge: Family;Available 24 hours/day (Grandson to stay with patient)  Question grandson ability to (A) at home. Grandson tearful and calling OTS into room stating pt's eye rolled back. Pt had fallen asleep and grandson became alarmed. RN notified and checking on pt and grandson. Pt very upset after event and family arriving in room soon afterward. Question caregiver  level of  care for d/c.  Type of Home: House Home Access: Stairs to enter [employer] of Steps: 4 Entrance Stairs-Rails: Right;Left;Can reach both Home Layout: One level Bathroom Shower/Tub: Forensic scientist: Standard Bathroom Accessibility: Yes How Accessible: Accessible via walker Home Adaptive Equipment: Straight cane Prior Function Level of Independence: Independent Able to Take Stairs?: Yes Driving: Yes Vocation: Retired Musician: No difficulties Dominant Hand: Right    Cognition  Overall Cognitive Status: Impaired Area of Impairment: Following commands;Attention Arousal/Alertness: Awake/alert Orientation Level: Appears intact for tasks assessed Behavior During Session: [facility] for tasks performed Current Attention Level: Sustained Attention - Other Comments: Very distracted in busy environment and needs cues to redirect to task Following Commands: Follows one step commands with increased time Problem Solving: difficulty finding way to don sock. OT gave vc's to lift leg and cross over knee to make task easier Cognition - Other Comments: Slow to process information.    Extremity/Trunk Assessment Right Upper Extremity Assessment RUE ROM/Strength/Tone: WFL for tasks assessed (grossly assessed 3+/5) Left Upper Extremity Assessment LUE ROM/Strength/Tone: WFL for tasks assessed (grossly assessed 3+/5) Right Lower Extremity Assessment RLE ROM/Strength/Tone: Deficits RLE ROM/Strength/Tone Deficits: Grossly 4/5 RLE Sensation: WFL - Proprioception;WFL - Light Touch RLE Coordination: WFL - gross/fine motor Left Lower Extremity Assessment LLE ROM/Strength/Tone: Deficits LLE ROM/Strength/Tone Deficits: Grossly 4/5 LLE Sensation: WFL - Light Touch;WFL - Proprioception LLE Coordination: WFL - gross/fine motor Trunk Assessment Trunk Assessment: Normal   Mobility Bed Mobility Bed Mobility: Supine to Sit;Sitting - Scoot to Edge of Bed Supine  to Sit: 4: Min assist Sitting - Scoot to Edge of Bed: 4: Min assist Details for Bed Mobility Assistance: Patient with slow movement and needs assistance to initiate task.  Transfers Sit to Stand: 4: Min assist;With upper extremity assist;From bed Stand to Sit: 4: Min assist;With upper extremity assist;To chair/3-in-1 Details for Transfer Assistance: Education in correct hand placement to and from device. Patient requires assistance to initiate and stabilize.       Balance Balance Balance Assessed: Yes Static Standing Balance Static Standing - Balance Support: Bilateral upper extremity supported Static Standing - Level of Assistance: 5: Stand by assistance  End of Session OT - End of Session Activity Tolerance: Patient tolerated treatment well Patient left: in chair;with call bell/phone within reach;with family/visitor present;with nursing in room  GO     Jenell Milliner 11/29/2011, 11:28 AM   Lucile Shutters   OTR/L Pager: [phone] Office: [phone] .

## 2011-11-29 NOTE — Evaluation (Signed)
Physical Therapy Evaluation Patient Details Name: [name] MRN: [id number] DOB: [birth year] Today's Date: 11/29/2011 Time: 7829-5621 PT Time Calculation (min): 22 min  PT Assessment / Plan / Recommendation Clinical Impression  Patient presented to the ED with left sided discomfort.  She was noted to be hypoxic especially with ambulation and with elevated troponins. Testing revealed a likely MI and PNA. Patient now with abdominal pain and work up for this is in progress. We will follow acutely to ensure that patient can reach a maximal level of function to decrease her burden of care at discharge.     PT Assessment  Patient needs continued PT services    Follow Up Recommendations  Home health PT;Supervision/Assistance - 24 hour    Barriers to Discharge  None      Equipment Recommendations  Rolling walker with 5" wheels    Recommendations for Other Services  None  Frequency Min 3X/week    Precautions / Restrictions Precautions Precautions: Fall   Pertinent Vitals/Pain Back pain.       Mobility  Bed Mobility Bed Mobility: Supine to Sit;Sitting - Scoot to Edge of Bed Supine to Sit: 4: Min assist Sitting - Scoot to Edge of Bed: 4: Min assist Details for Bed Mobility Assistance: Patient with slow movement and needs assistance to initiate task.  Transfers Transfers: Sit to Stand;Stand to Sit Sit to Stand: 4: Min assist;With upper extremity assist;From bed Stand to Sit: 4: Min assist;With upper extremity assist;To chair/3-in-1 Details for Transfer Assistance: Education in correct hand placement to and from device. Patient requires assistance to initiate and stabilize. Ambulation/Gait Ambulation/Gait Assistance: 4: Min assist Ambulation Distance (Feet): 30 Feet Assistive device: Rolling walker Ambulation/Gait Assistance Details: Requires verbal cues to negotiate tight spaces safely. Very slow speed and needs constant cues to keep stepping.  Gait Pattern: Step-through  pattern;Decreased stride length;Trunk flexed     PT Diagnosis: Difficulty walking;Generalized weakness  PT Problem List: Decreased activity tolerance;Decreased balance;Decreased mobility;Decreased strength;Decreased cognition;Decreased knowledge of use of DME;Pain PT Treatment Interventions: DME instruction;Gait training;Stair training;Therapeutic activities;Therapeutic exercise;Balance training;Patient/family education;Cognitive remediation   PT Goals Acute Rehab PT Goals PT Goal Formulation: With patient Time For Goal Achievement: 12/06/11 Potential to Achieve Goals: Good Pt will go Supine/Side to Sit: with supervision PT Goal: Supine/Side to Sit - Progress: Goal set today Pt will go Sit to Supine/Side: with supervision PT Goal: Sit to Supine/Side - Progress: Goal set today Pt will go Sit to Stand: with supervision;with upper extremity assist PT Goal: Sit to Stand - Progress: Goal set today Pt will go Stand to Sit: with supervision;with upper extremity assist PT Goal: Stand to Sit - Progress: Goal set today Pt will Ambulate: >150 feet;with supervision;with least restrictive assistive device PT Goal: Ambulate - Progress: Goal set today Pt will Go Up / Down Stairs: 3-5 stairs;with rail(s);with supervision PT Goal: Up/Down Stairs - Progress: Goal set today  Visit Information  Last PT Received On: 11/29/11 Assistance Needed: +1    Subjective Data  Subjective: Patient reports that she has been ambulating as far as the bedside commode with nursing Patient Stated Goal: Home and also to improve medical condition.   Prior Functioning  Home Living Lives With: Alone Available Help at Discharge: Family;Available 24 hours/day (Grandson to stay with patient) Type of Home: House Home Access: Stairs to enter [employer] of Steps: 4 Entrance Stairs-Rails: Right;Left;Can reach both Home Layout: One level Bathroom Shower/Tub: Forensic scientist:  Standard Bathroom Accessibility: Yes How Accessible: Accessible via walker  Home Adaptive Equipment: Straight cane Prior Function Level of Independence: Independent Able to Take Stairs?: Yes Driving: Yes Vocation: Retired Musician: No difficulties Dominant Hand: Right    Cognition  Overall Cognitive Status: Impaired Area of Impairment: Following commands;Attention Current Attention Level: Sustained Attention - Other Comments: Very distracted in busy environment and needs cues to redirect to task Cognition - Other Comments: Slow to process information.    Extremity/Trunk Assessment Right Lower Extremity Assessment RLE ROM/Strength/Tone: Deficits RLE ROM/Strength/Tone Deficits: Grossly 4/5 RLE Sensation: WFL - Proprioception;WFL - Light Touch RLE Coordination: WFL - gross/fine motor Left Lower Extremity Assessment LLE ROM/Strength/Tone: Deficits LLE ROM/Strength/Tone Deficits: Grossly 4/5 LLE Sensation: WFL - Light Touch;WFL - Proprioception LLE Coordination: WFL - gross/fine motor Trunk Assessment Trunk Assessment: Normal   Balance Balance Balance Assessed: Yes Static Standing Balance Static Standing - Balance Support: Bilateral upper extremity supported Static Standing - Level of Assistance: 5: Stand by assistance  End of Session PT - End of Session Equipment Utilized During Treatment: Gait belt Activity Tolerance: Patient limited by fatigue Patient left: in chair;with call bell/phone within reach;with family/visitor present Nurse Communication: Mobility status   Edwyna Perfect, PT  Pager [phone]  11/29/2011, 10:24 AM

## 2011-11-30 ENCOUNTER — Inpatient Hospital Stay (HOSPITAL_COMMUNITY): Payer: Medicare Other | Admitting: Certified Registered"

## 2011-11-30 ENCOUNTER — Encounter (HOSPITAL_COMMUNITY): Payer: Self-pay | Admitting: Certified Registered"

## 2011-11-30 ENCOUNTER — Inpatient Hospital Stay (HOSPITAL_COMMUNITY): Payer: Medicare Other

## 2011-11-30 ENCOUNTER — Encounter (HOSPITAL_COMMUNITY)
Admission: EM | Disposition: A | Discharge status: Skilled Nursing Facility | Payer: Self-pay | Source: Home / Self Care | Attending: Family Medicine

## 2011-11-30 ENCOUNTER — Encounter (HOSPITAL_COMMUNITY): Payer: Self-pay | Admitting: Radiology

## 2011-11-30 DIAGNOSIS — K450 Other specified abdominal hernia with obstruction, without gangrene: Secondary | ICD-10-CM

## 2011-11-30 HISTORY — PX: LAPAROTOMY: SHX154

## 2011-11-30 LAB — BASIC METABOLIC PANEL
BUN: 45 mg/dL — ABNORMAL HIGH (ref 6–23)
Calcium: 8.9 mg/dL (ref 8.4–10.5)
GFR calc Af Amer: >90 mL/min (ref >90)
GFR calc non Af Amer: 80 mL/min — ABNORMAL LOW (ref >90)
Potassium: 3.6 mEq/L (ref 3.5–5.1)

## 2011-11-30 LAB — GLUCOSE, CAPILLARY
Glucose-Capillary: 65 mg/dL — ABNORMAL LOW (ref 70–99)
Glucose-Capillary: 109 mg/dL — ABNORMAL HIGH (ref 70–99)

## 2011-11-30 LAB — CBC
Hemoglobin: 10.8 g/dL — ABNORMAL LOW (ref 12.0–15.0)
MCHC: 31.5 g/dL (ref 30.0–36.0)
Platelets: 310 10*3/uL (ref 150–400)
RDW: 17.5 % — ABNORMAL HIGH (ref 11.5–15.5)

## 2011-11-30 SURGERY — LAPAROTOMY, EXPLORATORY
Anesthesia: General | Site: Abdomen | Wound class: Clean

## 2011-11-30 MED ORDER — EPHEDRINE SULFATE 50 MG/ML IJ SOLN
INTRAMUSCULAR | Status: DC | PRN
  Administered 2011-11-30 (×2): 10 mg via INTRAVENOUS
  Administered 2011-11-30: 5 mg via INTRAVENOUS

## 2011-11-30 MED ORDER — DEXTROSE 50 % IV SOLN
INTRAVENOUS | Status: AC
  Administered 2011-11-30: 25 mL
  Filled 2011-11-30: qty 50

## 2011-11-30 MED ORDER — LACTATED RINGERS IV SOLN
INTRAVENOUS | Status: DC | PRN
  Administered 2011-11-30 – 2011-12-01 (×2): via INTRAVENOUS

## 2011-11-30 MED ORDER — ROCURONIUM BROMIDE 100 MG/10ML IV SOLN
INTRAVENOUS | Status: DC | PRN
  Administered 2011-11-30: 30 mg via INTRAVENOUS

## 2011-11-30 MED ORDER — 0.9 % SODIUM CHLORIDE (POUR BTL) OPTIME
TOPICAL | Status: DC | PRN
  Administered 2011-11-30: 500 mL

## 2011-11-30 MED ORDER — IOHEXOL 300 MG/ML  SOLN
80.0000 mL | Freq: Once | INTRAMUSCULAR | Status: AC | PRN
  Administered 2011-11-30: 80 mL via INTRAVENOUS

## 2011-11-30 MED ORDER — PROPOFOL 10 MG/ML IV EMUL
INTRAVENOUS | Status: DC | PRN
  Administered 2011-11-30: 120 mg via INTRAVENOUS

## 2011-11-30 MED ORDER — BIOTENE DRY MOUTH MT LIQD
15.0000 mL | Freq: Two times a day (BID) | OROMUCOSAL | Status: DC
  Administered 2011-11-30 – 2011-12-15 (×26): 15 mL via OROMUCOSAL

## 2011-11-30 MED ORDER — DEXTROSE-NACL 5-0.45 % IV SOLN
INTRAVENOUS | Status: DC
  Administered 2011-11-30 – 2011-12-04 (×5): via INTRAVENOUS

## 2011-11-30 MED ORDER — MORPHINE SULFATE 2 MG/ML IJ SOLN
2.0000 mg | Freq: Once | INTRAMUSCULAR | Status: AC
  Administered 2011-11-30: 2 mg via INTRAVENOUS
  Filled 2011-11-30: qty 1

## 2011-11-30 MED ORDER — LIDOCAINE HCL (CARDIAC) 20 MG/ML IV SOLN
INTRAVENOUS | Status: DC | PRN
  Administered 2011-11-30: 60 mg via INTRAVENOUS

## 2011-11-30 MED ORDER — SUCCINYLCHOLINE CHLORIDE 20 MG/ML IJ SOLN
INTRAMUSCULAR | Status: DC | PRN
  Administered 2011-11-30: 100 mg via INTRAVENOUS

## 2011-11-30 MED ORDER — SUFENTANIL CITRATE 50 MCG/ML IV SOLN
INTRAVENOUS | Status: DC | PRN
  Administered 2011-11-30: 10 ug via INTRAVENOUS

## 2011-11-30 SURGICAL SUPPLY — 40 items
BLADE SURG ROTATE 9660 (MISCELLANEOUS) ×1 IMPLANT
CANISTER SUCTION 2500CC (MISCELLANEOUS) ×2 IMPLANT
CHLORAPREP W/TINT 26ML (MISCELLANEOUS) ×2 IMPLANT
CLOTH BEACON ORANGE TIMEOUT ST (SAFETY) ×2 IMPLANT
COVER SURGICAL LIGHT HANDLE (MISCELLANEOUS) ×2 IMPLANT
DRAPE LAPAROSCOPIC ABDOMINAL (DRAPES) ×1 IMPLANT
DRAPE LAPAROTOMY T 102X78X121 (DRAPES) ×1 IMPLANT
DRAPE UTILITY 15X26 W/TAPE STR (DRAPE) ×4 IMPLANT
DRAPE WARM FLUID 44X44 (DRAPE) ×1 IMPLANT
ELECT BLADE 6.5 EXT (BLADE) ×2 IMPLANT
ELECT REM PT RETURN 9FT ADLT (ELECTROSURGICAL) ×2
ELECTRODE REM PT RTRN 9FT ADLT (ELECTROSURGICAL) ×1 IMPLANT
GLOVE BIO SURGEON STRL SZ8 (GLOVE) ×2 IMPLANT
GLOVE BIOGEL PI IND STRL 8 (GLOVE) ×1 IMPLANT
GLOVE BIOGEL PI INDICATOR 8 (GLOVE) ×1
GOWN PREVENTION PLUS XLARGE (GOWN DISPOSABLE) ×2 IMPLANT
GOWN STRL NON-REIN LRG LVL3 (GOWN DISPOSABLE) ×3 IMPLANT
KIT BASIN OR (CUSTOM PROCEDURE TRAY) ×2 IMPLANT
KIT ROOM TURNOVER OR (KITS) ×2 IMPLANT
LIGASURE IMPACT 36 18CM CVD LR (INSTRUMENTS) IMPLANT
NS IRRIG 1000ML POUR BTL (IV SOLUTION) ×3 IMPLANT
PACK GENERAL/GYN (CUSTOM PROCEDURE TRAY) ×2 IMPLANT
PAD ARMBOARD 7.5X6 YLW CONV (MISCELLANEOUS) ×2 IMPLANT
SPECIMEN JAR X LARGE (MISCELLANEOUS) IMPLANT
SPONGE GAUZE 4X4 12PLY (GAUZE/BANDAGES/DRESSINGS) ×1 IMPLANT
SPONGE LAP 18X18 X RAY DECT (DISPOSABLE) IMPLANT
STAPLER VISISTAT 35W (STAPLE) ×2 IMPLANT
SUCTION POOLE TIP (SUCTIONS) ×2 IMPLANT
SUT ETHIBOND NAB CT1 #1 30IN (SUTURE) ×2 IMPLANT
SUT PDS AB 1 TP1 96 (SUTURE) ×4 IMPLANT
SUT SILK 2 0 SH CR/8 (SUTURE) ×2 IMPLANT
SUT SILK 2 0 TIES 10X30 (SUTURE) ×2 IMPLANT
SUT SILK 3 0 SH CR/8 (SUTURE) ×2 IMPLANT
SUT SILK 3 0 TIES 10X30 (SUTURE) ×2 IMPLANT
TAPE CLOTH SURG 4X10 WHT LF (GAUZE/BANDAGES/DRESSINGS) ×1 IMPLANT
TOWEL OR 17X24 6PK STRL BLUE (TOWEL DISPOSABLE) ×2 IMPLANT
TOWEL OR 17X26 10 PK STRL BLUE (TOWEL DISPOSABLE) ×2 IMPLANT
TRAY FOLEY CATH 14FRSI W/METER (CATHETERS) ×1 IMPLANT
WATER STERILE IRR 1000ML POUR (IV SOLUTION) ×1 IMPLANT
YANKAUER SUCT BULB TIP NO VENT (SUCTIONS) IMPLANT

## 2011-11-30 NOTE — Progress Notes (Signed)
Dr.Maxwell radiologist called report of Abd CT- obturator hernia blocking small bowel. I notified Dr. Madilyn Fireman who was oc for Dr. Bosie Clos, he asked that I notify Dr. Sharyn Lull. Notified Dr. Sharyn Lull, he stated he will notify sx.

## 2011-11-30 NOTE — Consult Note (Signed)
Reason for Consult:obturator hernia with SBO Referring Physician: Abree Romick is an 78 y.o. female.  HPI: Patient was admitted with chest pain. She had a minor myocardial infarction. General cardiac catheterization. This was reportedly clear per Dr. Sharyn Lull.  Soon thereafter, she developed abdominal pain nausea and vomiting. Nasogastric tube was placed. X-rays were consistent with ileus versus partial small bowel obstruction. She did not improve over 48 hours. Dr. Bosie Clos from gastroenterology saw her this evening.He ordered CT scan of the abdomen and pelvis. This demonstrates left obturator hernia causing small bowel obstruction. I was asked to see her in consultation. She complains of abdominal pain. She's had ongoing nausea despite nasogastric tube.  Past Medical History  Diagnosis Date  . Syncope   . Unspecified cardiac device in situ     Loop recorder  . Atypical chest pain     Nonobstructive catheterization; negative Myoview 2011  . CVA 11/23/2008  . SEIZURE DISORDER 11/23/2008  . DEMENTIA 11/23/2008  . HYPERTENSION 11/23/2008  . Coronary artery disease     Past Surgical History  Procedure Date  . Loop recorder insertion   . Colonoscopy   . Eye surgery   . Cataract extraction, bilateral     Family History  Problem Relation Age of Onset  . Heart failure Neg Hx   . Stroke    . Heart disease      Social History:  reports that she has never smoked. She has never used smokeless tobacco. She reports that she does not drink alcohol or use illicit drugs.  Allergies:  Allergies  Allergen Reactions  . Codeine     Medications: I have reviewed the patient's current medications.  Results for orders placed during the hospital encounter of 11/24/11 (from the past 48 hour(s))  BASIC METABOLIC PANEL     Status: Abnormal   Collection Time   11/30/11  5:35 AM      Component Value Range Comment   Sodium 143  135 - 145 mEq/L    Potassium 3.6  3.5 - 5.1 mEq/L    Chloride  101  96 - 112 mEq/L    CO2 28  19 - 32 mEq/L    Glucose, Bld 61 (*) 70 - 99 mg/dL    BUN 45 (*) 6 - 23 mg/dL    Creatinine, Ser 1.61  0.50 - 1.10 mg/dL    Calcium 8.9  8.4 - 09.6 mg/dL    GFR calc non Af Amer 80 (*) >90 mL/min    GFR calc Af Amer >90  >90 mL/min   CBC     Status: Abnormal   Collection Time   11/30/11  5:35 AM      Component Value Range Comment   WBC 7.0  4.0 - 10.5 K/uL    RBC 5.20 (*) 3.87 - 5.11 MIL/uL    Hemoglobin 10.8 (*) 12.0 - 15.0 g/dL    HCT 04.5 (*) 40.9 - 46.0 %    MCV 66.0 (*) 78.0 - 100.0 fL    MCH 20.8 (*) 26.0 - 34.0 pg    MCHC 31.5  30.0 - 36.0 g/dL    RDW 81.1 (*) 91.4 - 15.5 %    Platelets 310  150 - 400 K/uL PLATELET COUNT CONFIRMED BY SMEAR  GLUCOSE, CAPILLARY     Status: Abnormal   Collection Time   11/30/11  8:19 AM      Component Value Range Comment   Glucose-Capillary 65 (*) 70 - 99 mg/dL  GLUCOSE, CAPILLARY     Status: Abnormal   Collection Time   11/30/11  9:26 AM      Component Value Range Comment   Glucose-Capillary 109 (*) 70 - 99 mg/dL   GLUCOSE, CAPILLARY     Status: Normal   Collection Time   11/30/11  5:03 PM      Component Value Range Comment   Glucose-Capillary 89  70 - 99 mg/dL   GLUCOSE, CAPILLARY     Status: Normal   Collection Time   11/30/11  9:16 PM      Component Value Range Comment   Glucose-Capillary 95  70 - 99 mg/dL    Comment 1 Notify RN       Ct Abdomen Pelvis W Contrast  11/30/2011  *RADIOLOGY REPORT*  Clinical Data: Abdominal pain.  CT ABDOMEN AND PELVIS WITH CONTRAST  Technique:  Multidetector CT imaging of the abdomen and pelvis was performed following the standard protocol during bolus administration of intravenous contrast.  Contrast: 80mL OMNIPAQUE IOHEXOL 300 MG/ML  SOLN  Comparison: 07/04/2006  Findings: The patient has developed a left obturator foramen hernia causing small bowel obstruction.  A small bowel loop is seen entering the left obturator foramen and bowel is dilated proximal to the hernia and not  dilated distal to the hernia.  The liver, spleen, pancreas, and kidneys are normal.  There is bilateral adrenal hypertrophy, left greater than right, essentially unchanged since the prior exam.  There are numerous diverticula in the colon without evidence of acute diverticulitis.  Uterus has been removed.  No free air or free fluid in the abdomen.  Extensive calcification in the abdominal aorta.  NG tube in good position.  The stomach is decompressed.  The patient has slight bibasilar atelectasis as well as bibasilar bronchiectasis.  No acute osseous abnormality.  Degenerative disc and joint disease at L4-5 in the lumbar spine.  The patient has a thoracolumbar scoliosis.  IMPRESSION:  1.  Left obturator foramen small bowel hernia causing small bowel obstruction. 2.  Bibasilar atelectasis and bibasilar bronchiectasis.  Critical Value/emergent results were called by telephone at the time of interpretation on 11/30/2011  at 7:20 p.m.  to  the patient's nurse, Neysa Bonito, who verbally acknowledged these results.  Original Report Authenticated By: Gwynn Burly, M.D.   Dg Abd Acute W/chest  11/29/2011  *RADIOLOGY REPORT*  Clinical Data: Upper abdominal/mid chest pain, NG tube  ACUTE ABDOMEN SERIES (ABDOMEN 2 VIEW & CHEST 1 VIEW)  Comparison: 11/28/2011  Findings: Stable chronic interstitial opacities.  Volume loss on the right with elevation of the right hemidiaphragm.  Mild bibasilar atelectasis.  No pleural effusion or pneumothorax.  The heart is top normal in size.  Loop recorder overlying the heart.  Two mildly prominent loops of bowel in the lower abdomen, grossly unchanged.  However, at least one of these loops appears to have incomplete folds, which suggests that it reflects colon.  Again seen are air-fluid levels on the decubitus view.  No free air.  This appearance is nonspecific, possibly reflecting adynamic ileus, although early/developing partial small bowel obstruction is not excluded.  Weighted feeding  tube in the proximal gastric body.  Cholelithiasis.  Mild degenerative changes of the visualized thoracolumbar spine.  IMPRESSION: No evidence of acute cardiopulmonary disease.  Stable chronic interstitial opacities.  Mildly prominent loops of bowel in the lower abdomen, possibly reflecting adynamic ileus, early/developing partial small bowel obstruction not excluded.  No free air.  Weighted feeding tube in the proximal  gastric body.  Original Report Authenticated By: Charline Bills, M.D.    Review of Systems  Constitutional: Negative.   HENT: Negative.   Eyes: Negative.   Respiratory: Negative.   Cardiovascular:       See history of present illness  Gastrointestinal: Positive for nausea, vomiting and abdominal pain. Negative for constipation.  Genitourinary: Negative.   Musculoskeletal: Negative.   Skin: Negative.   Neurological: Negative.    Blood pressure 132/73, pulse 60, temperature 98 F (36.7 C), temperature source Oral, resp. rate 16, height 4\' 11"  (1.499 m), weight 45.224 kg (99 lb 11.2 oz), SpO2 100.00%. Physical Exam  Constitutional: She is oriented to person, place, and time. No distress.       Very thin elderly female  HENT:  Head: Normocephalic and atraumatic.  Eyes: Pupils are equal, round, and reactive to light. No scleral icterus.  Neck: Normal range of motion. Neck supple. No tracheal deviation present. No thyromegaly present.  Cardiovascular: Normal rate, regular rhythm and normal heart sounds.   Respiratory: Effort normal and breath sounds normal. No stridor. No respiratory distress. She has no wheezes. She has no rales.  GI: Soft. She exhibits no distension. There is tenderness. There is no rebound and no guarding.       Tenderness is mild in bilateral lower quadrants with no guarding or peritonitis, few bowel sounds  Musculoskeletal: She exhibits no edema and no tenderness.  Neurological: She is alert and oriented to person, place, and time.  Skin: Skin is warm.     Assessment/Plan: Operator hernia causing small bowel obstruction. Patient is on IV antibiotics. We'll proceed emergently to the operating room for repair of obturator hernia and possible small bowel obstruction. Patient is at significant risk for perioperative complications including cardiac and pulmonary complications in light of her recent cardiac event. Unfortunately, if left untreated, the bowel within the hernia will likely perforate.  I discussed this in detail with the patient and her daughters. She is agreeable.  Dalila Arca E 11/30/2011, 10:28 PM

## 2011-11-30 NOTE — Progress Notes (Signed)
At 20:10, patient was complaining of 10/10 abdominal pain; appearing very uncomfortable. Gave morphine 2mg  IV per orders and re-assessed after 45 minutes. Patient experienced very little relief of pain, with current score of 9/10. Paged Dr. Sharyn Lull and requested dose increase.  1x order for morphine 2mg  IV received. Dr. Sharyn Lull also requested that surgery be contacted for additional support. Paged surgery and spoke with Dr. Janee Morn, who recommended giving morphine dose as ordered by Dr. Sharyn Lull and stated that he would be coming to see [name] shortly for consultation. Morphine was given at 21:55 per order.  Continuing to closely monitor.

## 2011-11-30 NOTE — Anesthesia Preprocedure Evaluation (Addendum)
Anesthesia Evaluation  Patient identified by MRN, date of birth, ID band Patient awake    Reviewed: Allergy & Precautions, H&P , NPO status   History of Anesthesia Complications Negative for: history of anesthetic complications  Airway Mallampati: II  Neck ROM: full    Dental   Pulmonary          Cardiovascular hypertension, Pt. on medications and Pt. on home beta blockers + CAD + dysrhythmias Atrial Fibrillation     Neuro/Psych Seizures -, Well Controlled,  PSYCHIATRIC DISORDERS CVA, No Residual Symptoms    GI/Hepatic Neg liver ROS,   Endo/Other    Renal/GU negative Renal ROS  negative genitourinary   Musculoskeletal   Abdominal   Peds  Hematology negative hematology ROS (+)   Anesthesia Other Findings   Reproductive/Obstetrics                         Anesthesia Physical Anesthesia Plan  ASA: III and Emergent  Anesthesia Plan: General   Post-op Pain Management:    Induction: Intravenous, Rapid sequence and Cricoid pressure planned  Airway Management Planned: Oral ETT  Additional Equipment:   Intra-op Plan:   Post-operative Plan: Extubation in OR  Informed Consent: I have reviewed the patients History and Physical, chart, labs and discussed the procedure including the risks, benefits and alternatives for the proposed anesthesia with the patient or authorized representative who has indicated his/her understanding and acceptance.   Dental advisory given  Plan Discussed with: CRNA, Anesthesiologist and Surgeon  Anesthesia Plan Comments:         Anesthesia Quick Evaluation

## 2011-11-30 NOTE — Consult Note (Signed)
Referring Provider: Dr. Sharyn Lull Primary Care Physician:  Provider Not In System Primary Gastroenterologist:  Dr. Randa Evens  Reason for Consultation:  Abdominal pain  HPI: [name] is a 78 y.o. female who was admitted on 11/24/11 for management of CHF and found to have a small MI and bilateral pneumonia who is being seen as a consult for abdominal pain at the request of Dr. Sharyn Lull today. Her daughter is in the room and reports she started having abdominal pain last Wednesday the day she came to the hospital. Daughter reports N/V that day as well with chills without fevers. Abdominal pain is generalized and patient says "it just hurts." She is unable to describe the pain. Daughter states no BM since admit. She has a history of constipation and takes Miralax at home. No history of melena or hematochezia. NG has put out 600 cc of feculent appearing material since yesterday at 7 pm. Nurse also reports an episode of emesis yesterday with NG in place. She saw Dr. Randa Evens in January for blood in her stool (BRB) and a colonoscopy in February showed 2 small tubular adenomas, left-sided diverticulosis, and medium-sized internal hemorrhoids. Xray shows air fluid levels and dilated loops of small bowel. Daughter reports weight loss over 10 pounds but exact duration and amount not known. Note from Dr. Randa Evens in January reports over 20 pound weight loss over the past year.   Past Medical History  Diagnosis Date  . Syncope   . Unspecified cardiac device in situ     Loop recorder  . Atypical chest pain     Nonobstructive catheterization; negative Myoview 2011  . CVA 11/23/2008  . SEIZURE DISORDER 11/23/2008  . DEMENTIA 11/23/2008  . HYPERTENSION 11/23/2008  . Coronary artery disease     Past Surgical History  Procedure Date  . Loop recorder insertion   . Colonoscopy   . Eye surgery   . Cataract extraction, bilateral     Prior to Admission medications   Medication Sig Start Date End Date Taking?  Authorizing Provider  donepezil (ARICEPT) 5 MG tablet Take 1 tablet by mouth daily. 08/20/10  Yes Historical Provider, MD  HYDROcodone-acetaminophen (VICODIN) 5-500 MG per tablet Take 1 tablet by mouth as needed. 07/16/10  Yes Historical Provider, MD  Multiple Vitamin (MULTIVITAMIN) tablet Take 1 tablet by mouth daily.     Yes Historical Provider, MD  NEXIUM 40 MG capsule Take 1 tablet by mouth daily. 08/19/10  Yes Historical Provider, MD  polyethylene glycol (MIRALAX / GLYCOLAX) packet Take 17 g by mouth daily.     Yes Historical Provider, MD    Scheduled Meds:   . antiseptic oral rinse  15 mL Mouth Rinse BID  . aspirin EC  81 mg Oral Daily  . azithromycin  250 mg Oral Daily  . cefTRIAXone (ROCEPHIN)  IV  1 g Intravenous Q24H  . dextrose      . donepezil  5 mg Oral Daily  . enoxaparin (LOVENOX) injection  30 mg Subcutaneous Q24H  . furosemide  20 mg Intravenous Once  . metoprolol tartrate  25 mg Oral BID  . nitroGLYCERIN  0.5 inch Topical Q8H  . pantoprazole (PROTONIX) IV  40 mg Intravenous Q24H  . ramipril  5 mg Oral Daily  . simvastatin  20 mg Oral q1800  . sodium chloride  3 mL Intravenous Q12H  . sucralfate  1 g Oral TID WC & HS   Continuous Infusions:   . dextrose 5 % and 0.45% NaCl 50  mL/hr at 11/30/11 1030   PRN Meds:.sodium chloride, acetaminophen, alum & mag hydroxide-simeth, guaiFENesin-dextromethorphan, magnesium hydroxide, morphine injection, nitroGLYCERIN, ondansetron (ZOFRAN) IV, sodium chloride  Allergies as of 11/24/2011 - Review Complete 09/02/2010  Allergen Reaction Noted  . Codeine  02/04/2010    Family History  Problem Relation Age of Onset  . Heart failure Neg Hx   . Stroke    . Heart disease      History   Social History  . Marital Status: Widowed    Spouse Name: N/A    Number of Children: N/A  . Years of Education: N/A   Occupational History  . sitter    Social History Main Topics  . Smoking status: Never Smoker   . Smokeless tobacco:  Never Used  . Alcohol Use: No  . Drug Use: No  . Sexually Active: No   Other Topics Concern  . Not on file   Social History Narrative   Widowed    Review of Systems: Unable to obtain from pt Physical Exam: Vital signs: Filed Vitals:   11/30/11 1500  BP: 128/68  Pulse: 70  Temp: 98 F (36.7 C)  Resp: 17   Last BM Date: 11/27/11 General:   Demented, Awake, cachetic, NAD, NG with feculent-looking material Lungs:  Clear throughout to auscultation anteriorly .    Heart:  Regular rate and rhythm; no murmurs, clicks, rubs,  or gallops. Abdomen: diffusely tender with guarding, ND, no BS appreciated  Rectal:  Deferred Ext: no edema  GI:  Lab Results:  Basename 11/30/11 0535  WBC 7.0  HGB 10.8*  HCT 34.3*  PLT 310   BMET  Basename 11/30/11 0535 11/28/11 0520  NA 143 136  K 3.6 3.7  CL 101 99  CO2 28 27  GLUCOSE 61* 92  BUN 45* 41*  CREATININE 0.70 0.69  CALCIUM 8.9 9.0   LFT No results found for this basename: PROT,ALBUMIN,AST,ALT,ALKPHOS,BILITOT,BILIDIR,IBILI in the last 72 hours PT/INR No results found for this basename: LABPROT:2,INR:2 in the last 72 hours   Studies/Results: Dg Abd Acute W/chest  11/29/2011  *RADIOLOGY REPORT*  Clinical Data: Upper abdominal/mid chest pain, NG tube  ACUTE ABDOMEN SERIES (ABDOMEN 2 VIEW & CHEST 1 VIEW)  Comparison: 11/28/2011  Findings: Stable chronic interstitial opacities.  Volume loss on the right with elevation of the right hemidiaphragm.  Mild bibasilar atelectasis.  No pleural effusion or pneumothorax.  The heart is top normal in size.  Loop recorder overlying the heart.  Two mildly prominent loops of bowel in the lower abdomen, grossly unchanged.  However, at least one of these loops appears to have incomplete folds, which suggests that it reflects colon.  Again seen are air-fluid levels on the decubitus view.  No free air.  This appearance is nonspecific, possibly reflecting adynamic ileus, although early/developing partial  small bowel obstruction is not excluded.  Weighted feeding tube in the proximal gastric body.  Cholelithiasis.  Mild degenerative changes of the visualized thoracolumbar spine.  IMPRESSION: No evidence of acute cardiopulmonary disease.  Stable chronic interstitial opacities.  Mildly prominent loops of bowel in the lower abdomen, possibly reflecting adynamic ileus, early/developing partial small bowel obstruction not excluded.  No free air.  Weighted feeding tube in the proximal gastric body.  Original Report Authenticated By: Charline Bills, M.D.    Impression/Plan: 78yo woman with abdominal pain, dilated small bowel on Xray and significant NG feculent-appearing output concerning for a small bowel obstruction. Needs an Abd/pelvis CT as the next step. Doubt a  gastric outlet obstruction. If an SBO seen on CT, then will need surgical consult to decide on possible surgery if obstruction does not clear. She likely has adhesions as the source. Continue NG suction. Continue strict NPO. Supportive care.    LOS: 6 days   Eldrick Penick C.  11/30/2011, 5:25 PM

## 2011-11-30 NOTE — Progress Notes (Signed)
PT Cancellation Note  Treatment cancelled today due to patient's refusal to participate. Patient barely opening eyes, but nodding and shaking head to questions. Declines OOB secondary to tired. Also noted low blood sugar this am.   Edwyna Perfect, PT  Pager [phone]  11/30/2011, 9:56 AM

## 2011-11-30 NOTE — Progress Notes (Signed)
Subjective:  Patient denies any chest pain or shortness of breath states abdominal pain has improved feels hungry and wants to go home. Had significant drainage through an NG suction Objective:  Vital Signs in the last 24 hours: Temp:  [97.4 F (36.3 C)-98 F (36.7 C)] 98 F (36.7 C) (07/02 0500) Pulse Rate:  [69-75] 69  (07/02 0500) Resp:  [16] 16  (07/02 0500) BP: (125-134)/(70-82) 134/70 mmHg (07/02 0500) SpO2:  [98 %-100 %] 100 % (07/02 0500) Weight:  [45.224 kg (99 lb 11.2 oz)] 45.224 kg (99 lb 11.2 oz) (07/02 0500)  Intake/Output from previous day: 07/01 0701 - 07/02 0700 In: -  Out: 375 [Emesis/NG output:375] Intake/Output from this shift: Total I/O In: -  Out: 675 [Emesis/NG output:675]  Physical Exam: Neck: no adenopathy, no carotid bruit, no JVD and supple, symmetrical, trachea midline Lungs: Decreased breath sound at bases with bibasilar rhonchi Heart: regular rate and rhythm, S1, S2 normal and Soft systolic murmur noted Abdomen: soft, non-tender; bowel sounds normal; no masses,  no organomegaly Extremities: extremities normal, atraumatic, no cyanosis or edema  Lab Results:  Basename 11/30/11 0535  WBC 7.0  HGB 10.8*  PLT 310    Basename 11/30/11 0535 11/28/11 0520  NA 143 136  K 3.6 3.7  CL 101 99  CO2 28 27  GLUCOSE 61* 92  BUN 45* 41*  CREATININE 0.70 0.69   No results found for this basename: TROPONINI:2,CK,MB:2 in the last 72 hours Hepatic Function Panel No results found for this basename: PROT,ALBUMIN,AST,ALT,ALKPHOS,BILITOT,BILIDIR,IBILI in the last 72 hours No results found for this basename: CHOL in the last 72 hours No results found for this basename: PROTIME in the last 72 hours  Imaging: Imaging results have been reviewed and Dg Abd Acute W/chest  11/29/2011  *RADIOLOGY REPORT*  Clinical Data: Upper abdominal/mid chest pain, NG tube  ACUTE ABDOMEN SERIES (ABDOMEN 2 VIEW & CHEST 1 VIEW)  Comparison: 11/28/2011  Findings: Stable chronic  interstitial opacities.  Volume loss on the right with elevation of the right hemidiaphragm.  Mild bibasilar atelectasis.  No pleural effusion or pneumothorax.  The heart is top normal in size.  Loop recorder overlying the heart.  Two mildly prominent loops of bowel in the lower abdomen, grossly unchanged.  However, at least one of these loops appears to have incomplete folds, which suggests that it reflects colon.  Again seen are air-fluid levels on the decubitus view.  No free air.  This appearance is nonspecific, possibly reflecting adynamic ileus, although early/developing partial small bowel obstruction is not excluded.  Weighted feeding tube in the proximal gastric body.  Cholelithiasis.  Mild degenerative changes of the visualized thoracolumbar spine.  IMPRESSION: No evidence of acute cardiopulmonary disease.  Stable chronic interstitial opacities.  Mildly prominent loops of bowel in the lower abdomen, possibly reflecting adynamic ileus, early/developing partial small bowel obstruction not excluded.  No free air.  Weighted feeding tube in the proximal gastric body.  Original Report Authenticated By: Charline Bills, M.D.   Dg Abd Acute W/chest  11/28/2011  *RADIOLOGY REPORT*  Clinical Data: Nausea and vomiting.  ACUTE ABDOMEN SERIES (ABDOMEN 2 VIEW & CHEST 1 VIEW)  Comparison: 11/25/2011; 07/04/2006  Findings: Loop recorder noted.  Low lung volumes are present.  Chronic interstitial opacities noted in both lungs with indistinct bibasilar airspace opacities.  Levoconvex thoracic scoliosis noted.  The right hemidiaphragm is elevated and gallstones are noted.  Scattered air-fluid levels are present in loops of bowel likely reflecting dilated small bowel,  and an abnormal but nonspecific appearance which could represent early small bowel obstruction or ileus.  IMPRESSION:  1.  Dilated loops mildly dilated loops of small bowel with air- fluid levels, and an abnormal appearance which could reflect early  obstruction or ileus. 2.  Bilateral basilar airspace opacities; pneumonia is not excluded. 3.  Chronic elevation of the right hemidiaphragm. 4.  Chronic interstitial lung disease. 5.  Cholelithiasis.  Original Report Authenticated By: Dellia Cloud, M.D.    Cardiac Studies:  Assessment/Plan:  Status post Probable small non-Q-wave myocardial infarction  Mild decompensated heart failure secondary to valvular heart disease  Bilateral pneumonia  Hypertension  History of syncope in the past  Dementia  History of questionable seizure disorder  Chronic anemia  Ileus/early small bowel obstruction  GERD  Plan Continue present management Obstructive series in a.m.  LOS: 6 days    Heily Carlucci N 11/30/2011, 11:54 AM

## 2011-11-30 NOTE — Preoperative (Addendum)
Beta Blockers   Reason not to administer Beta Blockers:Hypotension on induction of anesthesia.  Unstable B/P

## 2011-11-30 NOTE — Progress Notes (Signed)
Patients bs 67. Pt AAO, asymptomatic. Pt NPO 1/2 amp dextrose given.

## 2011-12-01 ENCOUNTER — Inpatient Hospital Stay (HOSPITAL_COMMUNITY): Payer: Medicare Other

## 2011-12-01 ENCOUNTER — Encounter (HOSPITAL_COMMUNITY): Payer: Self-pay | Admitting: General Surgery

## 2011-12-01 LAB — GLUCOSE, CAPILLARY
Glucose-Capillary: 100 mg/dL — ABNORMAL HIGH (ref 70–99)
Glucose-Capillary: 111 mg/dL — ABNORMAL HIGH (ref 70–99)

## 2011-12-01 LAB — CBC
Hemoglobin: 10.4 g/dL — ABNORMAL LOW (ref 12.0–15.0)
MCHC: 31 g/dL (ref 30.0–36.0)
RDW: 18 % — ABNORMAL HIGH (ref 11.5–15.5)
WBC: 7.5 10*3/uL (ref 4.0–10.5)

## 2011-12-01 LAB — SURGICAL PCR SCREEN
MRSA, PCR: NEGATIVE
Staphylococcus aureus: POSITIVE — AB

## 2011-12-01 MED ORDER — ONDANSETRON HCL 4 MG/2ML IJ SOLN: 4.0000 mg | Freq: Four times a day (QID) | INTRAMUSCULAR | Status: DC | PRN

## 2011-12-01 MED ORDER — HYDROMORPHONE HCL PF 1 MG/ML IJ SOLN
INTRAMUSCULAR | Status: AC
  Filled 2011-12-01: qty 1

## 2011-12-01 MED ORDER — GLYCOPYRROLATE 0.2 MG/ML IJ SOLN
INTRAMUSCULAR | Status: DC | PRN
  Administered 2011-12-01: 0.4 mg via INTRAVENOUS

## 2011-12-01 MED ORDER — HYDROMORPHONE HCL PF 1 MG/ML IJ SOLN
0.2500 mg | INTRAMUSCULAR | Status: DC | PRN
  Administered 2011-12-01 (×4): 0.5 mg via INTRAVENOUS

## 2011-12-01 MED ORDER — NEOSTIGMINE METHYLSULFATE 1 MG/ML IJ SOLN
INTRAMUSCULAR | Status: DC | PRN
  Administered 2011-12-01: 3 mg via INTRAVENOUS

## 2011-12-01 MED ORDER — MORPHINE SULFATE 2 MG/ML IJ SOLN
2.0000 mg | INTRAMUSCULAR | Status: DC | PRN
  Administered 2011-12-01: 2 mg via INTRAVENOUS
  Administered 2011-12-01 (×3): 4 mg via INTRAVENOUS
  Administered 2011-12-03 – 2011-12-05 (×2): 2 mg via INTRAVENOUS
  Filled 2011-12-01: qty 1
  Filled 2011-12-01 (×2): qty 2
  Filled 2011-12-01: qty 1
  Filled 2011-12-01: qty 2
  Filled 2011-12-01 (×2): qty 1

## 2011-12-01 NOTE — Progress Notes (Signed)
OT Cancellation Note  Treatment cancelled today due to:  Pt with severe pain at this time 10 out 10 s/p surg. Pt declining treatment due to pain level. RN reports providing pain medication this morning for pain level. OT to continue to follow acutely    Lucile Shutters   OTR/L Pager: [phone] Office: [phone] .

## 2011-12-01 NOTE — Anesthesia Postprocedure Evaluation (Signed)
Anesthesia Post Note  Patient: [name]  Procedure(s) Performed: Procedure(s) (LRB): EXPLORATORY LAPAROTOMY (N/A)  Anesthesia type: General  Patient location: PACU  Post pain: Pain level controlled and Adequate analgesia  Post assessment: Post-op Vital signs reviewed, Patient's Cardiovascular Status Stable, Respiratory Function Stable, Patent Airway and Pain level controlled  Last Vitals:  Filed Vitals:   11/30/11 2100  BP: 132/73  Pulse: 60  Temp: 36.7 C  Resp: 16    Post vital signs: Reviewed and stable  Level of consciousness: awake, alert  and oriented  Complications: No apparent anesthesia complications

## 2011-12-01 NOTE — Op Note (Signed)
11/24/2011 - 12/01/2011  12:19 AM  PATIENT:  [name]  78 y.o. female  PRE-OPERATIVE DIAGNOSIS:  Obstruction of bowel [560.9]Hernia of unspecified site, with obstruction [552.9]  POST-OPERATIVE DIAGNOSIS:  Obstruction of bowel [560.9]Hernia of unspecified site, with obstruction [552.9]  PROCEDURE:  Procedure(s): EXPLORATORY LAPAROTOMY PRIMARY REPAIR OF OBTURATOR HERNIA  SURGEON:  Surgeon(s): Liz Malady, MD  PHYSICIAN ASSISTANT:   ASSISTANTS: none   ANESTHESIA:   general  EBL:  Total I/O In: 1000 [I.V.:1000] Out: 75 [Urine:75]  BLOOD ADMINISTERED:none  DRAINS: none   SPECIMEN:  No Specimen  DISPOSITION OF SPECIMEN:  N/A  COUNTS:  YES  DICTATION: .Dragon DictationPatient was admitted with chest pain and had a cardiac event. After cardiac catheterization she was recovering but developed nausea and vomiting. Abdominal x-rays were consistent with partial small bowel obstruction versus ileus. This did not improve with nasogastric tube decompression so she was seen by gastroenterology today. CT scan demonstrated small bowel obstruction due to obturator hernia on the left side. She is brought for emergent repair. Informed consent was obtained. She was identified in the preop holding area. She is on antibiotic regimen. She is brought to the operating room and general endotracheal anesthesia was administered by the anesthesia staff. Abdomen was prepped and draped in sterile fashion after placement of Foley catheter by nursing staff. Time out procedure was done. Lower midline incision was made partially excising her old scar. Subcutaneous tissues were dissected down revealing the anterior fascia. This was divided along the midline. Peritoneal cavity was entered under direct vision. Exploration revealed Dilated small bowel proximal to the obturator hernia on the left side. The bowel was gently removed from the obturator canal. Bowel was intact and viable. Enteric contents milked  back and fourth pass the area of obstruction. Bowel was then run from ligament of Treitz to terminal ileum. There is no other significant adhesions of her site of obstruction. Enteric content again milked past the previous obstructed area. The obturator canal defect barely admitted the tip of a digit. It was repaired primarily with interrupted #1 Ethibond sutures. This closed the defect well. Abdomen was copiously irrigated. Bowel was returned to anatomic position. Counts were correct. Fascia was closed with 2 lengths of running #1 looped PDS tied in the middle. Subcutaneous tissues were irrigated and skin was closed with staples. Sterile dressing was applied.Sponge, needle, and instrument counts were all correct. Patient tolerated procedure well without apparent complication and was taken to recovery room in stable condition.  PATIENT DISPOSITION:  PACU - hemodynamically stable.   Delay start of Pharmacological VTE agent (>24hrs) due to surgical blood loss or risk of bleeding:  no  Violeta Gelinas, MD, MPH, FACS Pager: [phone]  7/3/201312:19 AM

## 2011-12-01 NOTE — Progress Notes (Addendum)
1 Day Post-Op  Subjective: Doing okay this morning, some postoperative pain. No NV. Tender to examination over surgical incision. Afebrile. NG remains in place draining bilious fluid. (1100 ml over past 24 hrs) Remains NPO,  IVF continue.  Objective: Vital signs in last 24 hours: Temp:  [96.8 F (36 C)-98.5 F (36.9 C)] 97.2 F (36.2 C) (07/03 0300) Pulse Rate:  [60-103] 78  (07/03 0600) Resp:  [16-20] 16  (07/03 0600) BP: (115-146)/(65-85) 126/66 mmHg (07/03 0600) SpO2:  [95 %-100 %] 100 % (07/03 0600) Weight:  [99 lb (44.906 kg)] 99 lb (44.906 kg) (07/03 0500) Last BM Date: 11/27/11  Intake/Output from previous day: 07/02 0701 - 07/03 0700 In: 1300 [I.V.:1300] Out: 1250 [Urine:150; Emesis/NG output:1100] Intake/Output this shift:    General appearance: alert, cooperative, appears stated age, cachectic and mild distress Abdomen: surgical wound appears well approximated, staples are in place, no bloating, no BS, no flatus. No erythema or drainage seen, remains tender to palpation.   Lab Results:   [facility] 11/30/11 0535  WBC 7.0  HGB 10.8*  HCT 34.3*  PLT 310   BMET  Basename 11/30/11 0535  NA 143  K 3.6  CL 101  CO2 28  GLUCOSE 61*  BUN 45*  CREATININE 0.70  CALCIUM 8.9   PT/INR No results found for this basename: LABPROT:2,INR:2 in the last 72 hours ABG No results found for this basename: PHART:2,PCO2:2,PO2:2,HCO3:2 in the last 72 hours  Studies/Results: Ct Abdomen Pelvis W Contrast  11/30/2011  *RADIOLOGY REPORT*  Clinical Data: Abdominal pain.  CT ABDOMEN AND PELVIS WITH CONTRAST  Technique:  Multidetector CT imaging of the abdomen and pelvis was performed following the standard protocol during bolus administration of intravenous contrast.  Contrast: 80mL OMNIPAQUE IOHEXOL 300 MG/ML  SOLN  Comparison: 07/04/2006  Findings: The patient has developed a left obturator foramen hernia causing small bowel obstruction.  A small bowel loop is seen entering the left  obturator foramen and bowel is dilated proximal to the hernia and not dilated distal to the hernia.  The liver, spleen, pancreas, and kidneys are normal.  There is bilateral adrenal hypertrophy, left greater than right, essentially unchanged since the prior exam.  There are numerous diverticula in the colon without evidence of acute diverticulitis.  Uterus has been removed.  No free air or free fluid in the abdomen.  Extensive calcification in the abdominal aorta.  NG tube in good position.  The stomach is decompressed.  The patient has slight bibasilar atelectasis as well as bibasilar bronchiectasis.  No acute osseous abnormality.  Degenerative disc and joint disease at L4-5 in the lumbar spine.  The patient has a thoracolumbar scoliosis.  IMPRESSION:  1.  Left obturator foramen small bowel hernia causing small bowel obstruction. 2.  Bibasilar atelectasis and bibasilar bronchiectasis.  Critical Value/emergent results were called by telephone at the time of interpretation on 11/30/2011  at 7:20 p.m.  to  the patient's nurse, Neysa Bonito, who verbally acknowledged these results.  Original Report Authenticated By: Gwynn Burly, M.D.   Dg Abd Acute W/chest  12/01/2011  *RADIOLOGY REPORT*  Clinical Data: Follow up small bowel obstruction.  ACUTE ABDOMEN SERIES (ABDOMEN 2 VIEW & CHEST 1 VIEW)  Comparison: 11/30/2011  Findings: There is a nasogastric tube with tip in the stomach.  The heart size appears normal.  There is mild diffuse pulmonary edema.  Lung volumes are low and there is asymmetric elevation of the right hemidiaphragm.  There is atelectasis noted in both lung bases.  Free intraperitoneal air is present compatible with interval surgery.  There is IV contrast material identified within the renal collecting systems.  The bowel gas pattern has improved since the previous exam.  IMPRESSION:  1.  Interval improvement in bowel obstruction status post exploratory laparotomy. 2.  Free intraperitoneal air compatible  with recent surgery. 3.  Pulmonary edema.  Original Report Authenticated By: Rosealee Albee, M.D.    Anti-infectives: Anti-infectives     Start     Dose/Rate Route Frequency Ordered Stop   11/26/11 1000   azithromycin (ZITHROMAX) tablet 250 mg        250 mg Oral Daily 11/25/11 1122 11/30/11 0959   11/25/11 2000   cefTRIAXone (ROCEPHIN) 1 g in dextrose 5 % 50 mL IVPB        1 g 100 mL/hr over 30 Minutes Intravenous Every 24 hours 11/25/11 1817     11/25/11 1130   azithromycin (ZITHROMAX) tablet 500 mg        500 mg Oral Daily 11/25/11 1122 11/25/11 1150          Assessment/Plan: s/p Procedure(s) (LRB): EXPLORATORY LAPAROTOMY (N/A) 1. Continue NPO 2. Continue with SCD's for DVT prophylaxis.  3. Need to restart lovenox in am? Was held post-op per Dr. Janee Morn.   LOS: 7 days    Aydian Dimmick 12/01/2011

## 2011-12-01 NOTE — Progress Notes (Signed)
Subjective:  Events from yesterday noted appreciate all consultants help. Patient states feeling better complains of generalized soreness and abdomen.  Objective:  Vital Signs in the last 24 hours: Temp:  [96.8 F (36 C)-98.5 F (36.9 C)] 97.2 F (36.2 C) (07/03 0300) Pulse Rate:  [60-103] 78  (07/03 0600) Resp:  [16-20] 16  (07/03 0900) BP: (115-146)/(65-85) 130/70 mmHg (07/03 0900) SpO2:  [95 %-100 %] 98 % (07/03 0900) Weight:  [44.906 kg (99 lb)] 44.906 kg (99 lb) (07/03 0500)  Intake/Output from previous day: 07/02 0701 - 07/03 0700 In: 1300 [I.V.:1300] Out: 1250 [Urine:150; Emesis/NG output:1100] Intake/Output from this shift: Total I/O In: 3 [I.V.:3] Out: -   Physical Exam: Neck: no adenopathy, no carotid bruit, no JVD and supple, symmetrical, trachea midline Lungs: Decrease vessel at bases with occasional rhonchi Heart: regular rate and rhythm, S1, S2 normal and Soft systolic murmur noted Abdomen: Soft faint bowel sounds generalized tenderness noted Extremities: extremities normal, atraumatic, no cyanosis or edema  Lab Results:  Basename 11/30/11 0535  WBC 7.0  HGB 10.8*  PLT 310    Basename 11/30/11 0535  NA 143  K 3.6  CL 101  CO2 28  GLUCOSE 61*  BUN 45*  CREATININE 0.70   No results found for this basename: TROPONINI:2,CK,MB:2 in the last 72 hours Hepatic Function Panel No results found for this basename: PROT,ALBUMIN,AST,ALT,ALKPHOS,BILITOT,BILIDIR,IBILI in the last 72 hours No results found for this basename: CHOL in the last 72 hours No results found for this basename: PROTIME in the last 72 hours  Imaging: Imaging results have been reviewed and Ct Abdomen Pelvis W Contrast  11/30/2011  *RADIOLOGY REPORT*  Clinical Data: Abdominal pain.  CT ABDOMEN AND PELVIS WITH CONTRAST  Technique:  Multidetector CT imaging of the abdomen and pelvis was performed following the standard protocol during bolus administration of intravenous contrast.  Contrast:  80mL OMNIPAQUE IOHEXOL 300 MG/ML  SOLN  Comparison: 07/04/2006  Findings: The patient has developed a left obturator foramen hernia causing small bowel obstruction.  A small bowel loop is seen entering the left obturator foramen and bowel is dilated proximal to the hernia and not dilated distal to the hernia.  The liver, spleen, pancreas, and kidneys are normal.  There is bilateral adrenal hypertrophy, left greater than right, essentially unchanged since the prior exam.  There are numerous diverticula in the colon without evidence of acute diverticulitis.  Uterus has been removed.  No free air or free fluid in the abdomen.  Extensive calcification in the abdominal aorta.  NG tube in good position.  The stomach is decompressed.  The patient has slight bibasilar atelectasis as well as bibasilar bronchiectasis.  No acute osseous abnormality.  Degenerative disc and joint disease at L4-5 in the lumbar spine.  The patient has a thoracolumbar scoliosis.  IMPRESSION:  1.  Left obturator foramen small bowel hernia causing small bowel obstruction. 2.  Bibasilar atelectasis and bibasilar bronchiectasis.  Critical Value/emergent results were called by telephone at the time of interpretation on 11/30/2011  at 7:20 p.m.  to  the patient's nurse, Neysa Bonito, who verbally acknowledged these results.  Original Report Authenticated By: Gwynn Burly, M.D.   Dg Abd Acute W/chest  12/01/2011  *RADIOLOGY REPORT*  Clinical Data: Follow up small bowel obstruction.  ACUTE ABDOMEN SERIES (ABDOMEN 2 VIEW & CHEST 1 VIEW)  Comparison: 11/30/2011  Findings: There is a nasogastric tube with tip in the stomach.  The heart size appears normal.  There is mild diffuse pulmonary  edema.  Lung volumes are low and there is asymmetric elevation of the right hemidiaphragm.  There is atelectasis noted in both lung bases.  Free intraperitoneal air is present compatible with interval surgery.  There is IV contrast material identified within the renal  collecting systems.  The bowel gas pattern has improved since the previous exam.  IMPRESSION:  1.  Interval improvement in bowel obstruction status post exploratory laparotomy. 2.  Free intraperitoneal air compatible with recent surgery. 3.  Pulmonary edema.  Original Report Authenticated By: Rosealee Albee, M.D.    Cardiac Studies:  Assessment/Plan:  EXPLORATORY LAPAROTOMY  PRIMARY REPAIR OF OBTURATOR HERNIA for small bowel obstruction  postop day 1 doing well Status post Probable small non-Q-wave myocardial infarction  Mild decompensated heart failure secondary to valvular heart disease  Resolving Bilateral pneumonia  Hypertension  History of syncope in the past  Dementia  History of questionable seizure disorder  Chronic anemia  Plan Continue present management Incentive spirometry Out of bed to chair as tolerated  LOS: 7 days    Meshawn Oconnor N 12/01/2011, 12:40 PM

## 2011-12-01 NOTE — Progress Notes (Addendum)
PT Cancellation Note  Treatment cancelled today due to medical issues with patient which prohibited therapy. Patient with exploratory laparotomy for hernia and SBO late last night. She is now having 9/10 pain despite medication with morphine. She therefore does not feel well enough to mobilize at this time. I educated patient on ankle pump and quad set exercises and advised her to be OOB with nursing later today if she felt able.    Child Campoy 12/01/2011, 9:38 AM

## 2011-12-01 NOTE — Transfer of Care (Signed)
Immediate Anesthesia Transfer of Care Note  Patient: [name]  Procedure(s) Performed: Procedure(s) (LRB): EXPLORATORY LAPAROTOMY (N/A)  Patient Location: PACU  Anesthesia Type: General  Level of Consciousness: sedated, patient cooperative and responds to stimulation  Airway & Oxygen Therapy: Patient Spontanous Breathing and Patient connected to nasal cannula oxygen  Post-op Assessment: Report given to PACU RN, Post -op Vital signs reviewed and stable and Patient moving all extremities  Post vital signs: Reviewed and stable  Complications: No apparent anesthesia complications

## 2011-12-01 NOTE — Progress Notes (Signed)
Patient ID: [name], female   DOB: Nov 14, 1929, 78 y.o.   MRN: [id number]  Recent events noted with CT showing SBO and surgery being done. Sister at bedside. Patient awake and complaining of pain. Reports nurse has been called. Appreciate surgery's care. Will sign off. Call if questions.

## 2011-12-01 NOTE — Progress Notes (Signed)
Patient having pain.  May have ice chips.  Marta Lamas. Gae Bon, MD, FACS [phone] [facility]

## 2011-12-01 NOTE — Progress Notes (Signed)
12-01-11 Pt was discussed in Long Length of stay meeting today.   

## 2011-12-02 ENCOUNTER — Encounter (HOSPITAL_COMMUNITY): Payer: Self-pay | Admitting: Surgery

## 2011-12-02 DIAGNOSIS — K450 Other specified abdominal hernia with obstruction, without gangrene: Secondary | ICD-10-CM

## 2011-12-02 HISTORY — DX: Other specified abdominal hernia with obstruction, without gangrene: K45.0

## 2011-12-02 LAB — GLUCOSE, CAPILLARY: Glucose-Capillary: 93 mg/dL (ref 70–99)

## 2011-12-02 LAB — CBC
HCT: 31 % — ABNORMAL LOW (ref 36.0–46.0)
Hemoglobin: 9.7 g/dL — ABNORMAL LOW (ref 12.0–15.0)
RBC: 4.66 MIL/uL (ref 3.87–5.11)

## 2011-12-02 LAB — BASIC METABOLIC PANEL
Chloride: 105 mEq/L (ref 96–112)
GFR calc Af Amer: >90 mL/min (ref >90)
GFR calc non Af Amer: 84 mL/min — ABNORMAL LOW (ref >90)
Glucose, Bld: 98 mg/dL (ref 70–99)
Potassium: 3.6 mEq/L (ref 3.5–5.1)
Sodium: 142 mEq/L (ref 135–145)

## 2011-12-02 NOTE — Progress Notes (Signed)
Dr. Sharyn Lull made aware that pt npo and not getting any of her meds. She is stable but if he wanted her to get anything it needed to be switched to iv. No new orders at this time

## 2011-12-02 NOTE — Progress Notes (Signed)
Physical Therapy Treatment Patient Details Name: [name] MRN: [id number] DOB: [birth year] Today's Date: 12/02/2011 Time: 4098-1191 PT Time Calculation (min): 24 min  PT Assessment / Plan / Recommendation Comments on Treatment Session  Patient with decline in function since surgery secondary to lethargy and pain that is limiting her desire to mobilize. She may need rehab at discharge to facilitate transition home with family care.     Follow Up Recommendations  Inpatient Rehab    Barriers to Discharge  None      Equipment Recommendations  Defer to next venue    Recommendations for Other Services Rehab consult  Frequency Min 3X/week   Plan Discharge plan needs to be updated;Frequency remains appropriate    Precautions / Restrictions Precautions Precautions: Fall   Pertinent Vitals/Pain VSS/ Pain of abdomen noted with bed mobility    Mobility  Bed Mobility Bed Mobility: Rolling Right;Right Sidelying to Sit Rolling Right: 3: Mod assist Right Sidelying to Sit: 3: Mod assist;HOB elevated Sitting - Scoot to Edge of Bed: 2: Max assist Details for Bed Mobility Assistance: Patient with significant difficulty elevating trunk from lying position.  Transfers Transfers: Stand Pivot Transfers Sit to Stand: 2: Max assist;From bed Stand to Sit: 3: Mod assist;To chair/3-in-1 Stand Pivot Transfers: 2: Max assist Details for Transfer Assistance: Patient unable to stand fully erect. Able to take 2-3 steps but limited by weakness and quickly reaqches for arm rest to pivot.     Exercises General Exercises - Lower Extremity Long Arc Quad: AAROM;Both;10 reps;Seated Hip Flexion/Marching: AAROM;Both;10 reps;Seated    PT Goals Acute Rehab PT Goals PT Goal: Supine/Side to Sit - Progress: Progressing toward goal PT Goal: Sit to Stand - Progress: Progressing toward goal PT Goal: Stand to Sit - Progress: Progressing toward goal  Visit Information  Last PT Received On: 12/02/11 Assistance  Needed: +1    Subjective Data  Subjective: Patient asked "can i get something to eat?" Patient Stated Goal: None stated   Cognition  Overall Cognitive Status: Impaired Area of Impairment: Attention Arousal/Alertness: Lethargic Behavior During Session: Lethargic Current Attention Level: Sustained    Balance  Static Sitting Balance Static Sitting - Balance Support: Feet supported;No upper extremity supported Static Sitting - Level of Assistance: 3: Mod assist Static Sitting - Comment/# of Minutes: Patient very flexed in sitting with difficulty maintaning head into an upright position. Patient unable to maintain balance without assistance - requires assistance to prevent posterior and lateral lean.   End of Session PT - End of Session Equipment Utilized During Treatment: Gait belt Activity Tolerance: Patient limited by fatigue Patient left: in chair;with family/visitor present;with call bell/phone within reach Nurse Communication: Mobility status    Edwyna Perfect, PT  Pager [phone]  12/02/2011, 1:46 PM

## 2011-12-02 NOTE — Progress Notes (Signed)
Subjective:  Patient denies any chest pain or shortness of breath complains of abdominal soreness  Objective:  Vital Signs in the last 24 hours: Temp:  [97.8 F (36.6 C)-99.1 F (37.3 C)] 98 F (36.7 C) (07/04 0800) Pulse Rate:  [69-78] 70  (07/04 0800) Resp:  [15-17] 16  (07/04 0800) BP: (114-126)/(62-75) 120/65 mmHg (07/04 0800) SpO2:  [97 %-100 %] 99 % (07/04 0800)  Intake/Output from previous day: 07/03 0701 - 07/04 0700 In: 3 [I.V.:3] Out: 475 [Urine:425; Emesis/NG output:50] Intake/Output from this shift:    Physical Exam: Neck: no adenopathy, no carotid bruit, no JVD and supple, symmetrical, trachea midline Lungs: Decrease stress on at bases with occasional rhonchi Heart: regular rate and rhythm, S1, S2 normal and Soft systolic murmur noted Abdomen: Soft bowel sounds absent generalized tenderness Extremities: extremities normal, atraumatic, no cyanosis or edema  Lab Results:  Basename 12/02/11 0600 12/01/11 2104  WBC 6.9 7.5  HGB 9.7* 10.4*  PLT 239 265    Basename 12/02/11 0600 11/30/11 0535  NA 142 143  K 3.6 3.6  CL 105 101  CO2 29 28  GLUCOSE 98 61*  BUN 22 45*  CREATININE 0.61 0.70   No results found for this basename: TROPONINI:2,CK,MB:2 in the last 72 hours Hepatic Function Panel No results found for this basename: PROT,ALBUMIN,AST,ALT,ALKPHOS,BILITOT,BILIDIR,IBILI in the last 72 hours No results found for this basename: CHOL in the last 72 hours No results found for this basename: PROTIME in the last 72 hours  Imaging: Imaging results have been reviewed and Ct Abdomen Pelvis W Contrast  11/30/2011  *RADIOLOGY REPORT*  Clinical Data: Abdominal pain.  CT ABDOMEN AND PELVIS WITH CONTRAST  Technique:  Multidetector CT imaging of the abdomen and pelvis was performed following the standard protocol during bolus administration of intravenous contrast.  Contrast: 80mL OMNIPAQUE IOHEXOL 300 MG/ML  SOLN  Comparison: 07/04/2006  Findings: The patient has  developed a left obturator foramen hernia causing small bowel obstruction.  A small bowel loop is seen entering the left obturator foramen and bowel is dilated proximal to the hernia and not dilated distal to the hernia.  The liver, spleen, pancreas, and kidneys are normal.  There is bilateral adrenal hypertrophy, left greater than right, essentially unchanged since the prior exam.  There are numerous diverticula in the colon without evidence of acute diverticulitis.  Uterus has been removed.  No free air or free fluid in the abdomen.  Extensive calcification in the abdominal aorta.  NG tube in good position.  The stomach is decompressed.  The patient has slight bibasilar atelectasis as well as bibasilar bronchiectasis.  No acute osseous abnormality.  Degenerative disc and joint disease at L4-5 in the lumbar spine.  The patient has a thoracolumbar scoliosis.  IMPRESSION:  1.  Left obturator foramen small bowel hernia causing small bowel obstruction. 2.  Bibasilar atelectasis and bibasilar bronchiectasis.  Critical Value/emergent results were called by telephone at the time of interpretation on 11/30/2011  at 7:20 p.m.  to  the patient's nurse, Neysa Bonito, who verbally acknowledged these results.  Original Report Authenticated By: Gwynn Burly, M.D.   Dg Abd Acute W/chest  12/01/2011  *RADIOLOGY REPORT*  Clinical Data: Follow up small bowel obstruction.  ACUTE ABDOMEN SERIES (ABDOMEN 2 VIEW & CHEST 1 VIEW)  Comparison: 11/30/2011  Findings: There is a nasogastric tube with tip in the stomach.  The heart size appears normal.  There is mild diffuse pulmonary edema.  Lung volumes are low and there is  asymmetric elevation of the right hemidiaphragm.  There is atelectasis noted in both lung bases.  Free intraperitoneal air is present compatible with interval surgery.  There is IV contrast material identified within the renal collecting systems.  The bowel gas pattern has improved since the previous exam.  IMPRESSION:   1.  Interval improvement in bowel obstruction status post exploratory laparotomy. 2.  Free intraperitoneal air compatible with recent surgery. 3.  Pulmonary edema.  Original Report Authenticated By: Rosealee Albee, M.D.    Cardiac Studies:  Assessment/Plan:  EXPLORATORY LAPAROTOMY  PRIMARY REPAIR OF OBTURATOR HERNIA for small bowel obstruction postop day 2 Status post Probable small non-Q-wave myocardial infarction  Mild decompensated heart failure secondary to valvular heart disease  Resolving Bilateral pneumonia  Hypertension  History of syncope in the past  Dementia  History of questionable seizure disorder  Chronic anemia  Plan Continue present management per surgery Antibiotics per surgery  LOS: 8 days    Iseah Plouff N 12/02/2011, 9:12 AM

## 2011-12-02 NOTE — Progress Notes (Signed)
2 Days Post-Op  Subjective: Sleepy, no abdominal pain  Objective: Vital signs in last 24 hours: Temp:  [97.8 F (36.6 C)-99.1 F (37.3 C)] 98 F (36.7 C) (07/04 0800) Pulse Rate:  [69-78] 70  (07/04 0800) Resp:  [15-17] 16  (07/04 0800) BP: (114-126)/(62-75) 120/65 mmHg (07/04 0800) SpO2:  [97 %-100 %] 99 % (07/04 0800)   Intake/Output from previous day: 07/03 0701 - 07/04 0700 In: 3 [I.V.:3] Out: 475 [Urine:425; Emesis/NG output:50] Intake/Output this shift:     General appearance: appears stated age, fatigued and no distress Resp: clear to auscultation bilaterally Cardio: regular rate and rhythm, S1, S2 normal, no murmur, click, rub or gallop GI: soft, quiet, mildly tender no BS  Incision: healing well  Lab Results:   Basename 12/02/11 0600 12/01/11 2104  WBC 6.9 7.5  HGB 9.7* 10.4*  HCT 31.0* 33.5*  PLT 239 265   BMET  Basename 12/02/11 0600 11/30/11 0535  NA 142 143  K 3.6 3.6  CL 105 101  CO2 29 28  GLUCOSE 98 61*  BUN 22 45*  CREATININE 0.61 0.70  CALCIUM 8.1* 8.9   PT/INR No results found for this basename: LABPROT:2,INR:2 in the last 72 hours ABG No results found for this basename: PHART:2,PCO2:2,PO2:2,HCO3:2 in the last 72 hours  MEDS, Scheduled    . antiseptic oral rinse  15 mL Mouth Rinse BID  . aspirin EC  81 mg Oral Daily  . cefTRIAXone (ROCEPHIN)  IV  1 g Intravenous Q24H  . donepezil  5 mg Oral Daily  . furosemide  20 mg Intravenous Once  . HYDROmorphone      . HYDROmorphone      . metoprolol tartrate  25 mg Oral BID  . nitroGLYCERIN  0.5 inch Topical Q8H  . pantoprazole (PROTONIX) IV  40 mg Intravenous Q24H  . ramipril  5 mg Oral Daily  . simvastatin  20 mg Oral q1800  . sodium chloride  3 mL Intravenous Q12H  . sucralfate  1 g Oral TID WC & HS  . DISCONTD: enoxaparin (LOVENOX) injection  30 mg Subcutaneous Q24H    Studies/Results: Ct Abdomen Pelvis W Contrast  11/30/2011  *RADIOLOGY REPORT*  Clinical Data: Abdominal pain.   CT ABDOMEN AND PELVIS WITH CONTRAST  Technique:  Multidetector CT imaging of the abdomen and pelvis was performed following the standard protocol during bolus administration of intravenous contrast.  Contrast: 80mL OMNIPAQUE IOHEXOL 300 MG/ML  SOLN  Comparison: 07/04/2006  Findings: The patient has developed a left obturator foramen hernia causing small bowel obstruction.  A small bowel loop is seen entering the left obturator foramen and bowel is dilated proximal to the hernia and not dilated distal to the hernia.  The liver, spleen, pancreas, and kidneys are normal.  There is bilateral adrenal hypertrophy, left greater than right, essentially unchanged since the prior exam.  There are numerous diverticula in the colon without evidence of acute diverticulitis.  Uterus has been removed.  No free air or free fluid in the abdomen.  Extensive calcification in the abdominal aorta.  NG tube in good position.  The stomach is decompressed.  The patient has slight bibasilar atelectasis as well as bibasilar bronchiectasis.  No acute osseous abnormality.  Degenerative disc and joint disease at L4-5 in the lumbar spine.  The patient has a thoracolumbar scoliosis.  IMPRESSION:  1.  Left obturator foramen small bowel hernia causing small bowel obstruction. 2.  Bibasilar atelectasis and bibasilar bronchiectasis.  Critical Value/emergent results were  called by telephone at the time of interpretation on 11/30/2011  at 7:20 p.m.  to  the patient's nurse, Neysa Bonito, who verbally acknowledged these results.  Original Report Authenticated By: Gwynn Burly, M.D.   Dg Abd Acute W/chest  12/01/2011  *RADIOLOGY REPORT*  Clinical Data: Follow up small bowel obstruction.  ACUTE ABDOMEN SERIES (ABDOMEN 2 VIEW & CHEST 1 VIEW)  Comparison: 11/30/2011  Findings: There is a nasogastric tube with tip in the stomach.  The heart size appears normal.  There is mild diffuse pulmonary edema.  Lung volumes are low and there is asymmetric elevation  of the right hemidiaphragm.  There is atelectasis noted in both lung bases.  Free intraperitoneal air is present compatible with interval surgery.  There is IV contrast material identified within the renal collecting systems.  The bowel gas pattern has improved since the previous exam.  IMPRESSION:  1.  Interval improvement in bowel obstruction status post exploratory laparotomy. 2.  Free intraperitoneal air compatible with recent surgery. 3.  Pulmonary edema.  Original Report Authenticated By: Rosealee Albee, M.D.    Assessment: s/p Procedure(s): EXPLORATORY LAPAROTOMY Stable post op  Plan: Continue N/g, try to ambulate   LOS: 8 days     Currie Paris, MD, [facility], [place] [phone]   12/02/2011 12:41 PM

## 2011-12-03 LAB — GLUCOSE, CAPILLARY
Glucose-Capillary: 99 mg/dL (ref 70–99)
Glucose-Capillary: 103 mg/dL — ABNORMAL HIGH (ref 70–99)
Glucose-Capillary: 107 mg/dL — ABNORMAL HIGH (ref 70–99)
Glucose-Capillary: 77 mg/dL (ref 70–99)

## 2011-12-03 NOTE — Progress Notes (Signed)
Occupational Therapy Treatment Patient Details Name: [name] MRN: [id number] DOB: [birth year] Today's Date: 12/03/2011 Time: 4098-1191 OT Time Calculation (min): 36 min  OT Assessment / Plan / Recommendation Comments on Treatment Session Pt needing mod to max assist overall for bed mobility and simulated toileting transfers.  Decreased sustained attention and initiation this session as well. Will continue to need acute OT services as well as follow-up rehab at inpatient setting.  Feel she will not be ready from a therapy standpoint for transition home from acute care if LOS is less than a couple of days.    Follow Up Recommendations  Inpatient Rehab       Equipment Recommendations  Defer to next venue    Recommendations for Other Services Rehab consult  Frequency Min 2X/week   Plan Discharge plan needs to be updated    Precautions / Restrictions Precautions Precautions: Fall Precaution Comments: NPO  Restrictions Weight Bearing Restrictions: No   Pertinent Vitals/Pain Pain faces 4\10    ADL  Toilet Transfer: Simulated;Moderate assistance Toilet Transfer Method: Stand pivot Toilet Transfer Equipment: Other (comment) (EOB) Transfers/Ambulation Related to ADLs: Pt required mod facilitation to take a few small steps with use of the RW.  Exhibits moderate cervical and thoracic flexion in standing and when attempting mobility.  Pt also noted to exhibit increased knee flexion in standing and unable to achieve and maintain knee extension.      OT Goals ADL Goals ADL Goal: Toilet Transfer - Progress: Not progressing ADL Goal: Toileting - Clothing Manipulation - Progress: Not progressing ADL Goal: Toileting - Hygiene - Progress: Not progressing  Visit Information  Last OT Received On: 12/03/11 Assistance Needed: +1    Subjective Data  Subjective: "ok" in response to therapist asking her if she wanted to try and get up. Patient Stated Goal: Did not state but agreeable to  OOB.      Cognition  Overall Cognitive Status: Impaired Area of Impairment: Attention Arousal/Alertness: Lethargic Orientation Level: Disoriented to;Situation;Time Behavior During Session: Lethargic Current Attention Level: Focused Following Commands: Follows one step commands consistently    Mobility Bed Mobility Bed Mobility: Rolling Left;Rolling Right;Right Sidelying to Sit;Sit to Sidelying Right Rolling Right: 4: Min assist Rolling Left: 4: Min assist Right Sidelying to Sit: 2: Max assist;HOB flat Sitting - Scoot to [place] of Bed: 2: Max assist Transfers Transfers: Sit to Stand Sit to Stand: 3: Mod assist;Without upper extremity assist Details for Transfer Assistance: Max instructional cueing for hand placement with sit to stand on 2 intervals.      Balance Static Standing Balance Static Standing - Balance Support: Right upper extremity supported;Left upper extremity supported Static Standing - Level of Assistance: 3: Mod assist  End of Session OT - End of Session Activity Tolerance: Patient limited by fatigue Patient left: in bed;with call bell/phone within reach;with family/visitor present     Kaylob Wallen OTR/L 12/03/2011, 5:11 PM

## 2011-12-03 NOTE — Progress Notes (Signed)
Patient's NG tube pulled out about 6 inches while working with occupational therapy this afternoon.  NG tube reinserted and position confirmed with 30 cc air by me and Health and safety inspector.  Patient tolerated reinsertion well.  Will continue to monitor.  Colman Cater

## 2011-12-03 NOTE — Clinical Social Work Psychosocial (Signed)
     Clinical Social Work Department BRIEF PSYCHOSOCIAL ASSESSMENT 12/03/2011  Patient:  ALIKA, EPPES     Account Number:  [id number]     Admit date:  11/24/2011  Clinical Social Worker:  Doree Albee  Date/Time:  12/03/2011 04:00 PM  Referred by:  RN  Date Referred:  12/03/2011 Referred for  SNF Placement  SNF Placement   Other Referral:   Interview type:  Family Other interview type:    PSYCHOSOCIAL DATA Living Status:  ALONE Admitted from facility:   Level of care:   Primary support name:  laverne Greenbaum-davis Primary support relationship to patient:  CHILD, ADULT Degree of support available:   and Hector [place] strong    CURRENT CONCERNS Current Concerns  Post-Acute Placement   Other Concerns:    SOCIAL WORK ASSESSMENT / PLAN CSW met with pt and pt niece at bedside to discuss pt discharge plans however pt currently sleeping. Pt niece asked csw to speak with pt daughters.    Pt daughters shared that patient currently lived at home however pt granddaughter was attempting to move back home with pt to provide more assistace.    CSW and pt daughters discussed recommendations for short term rehab at snf. Pt daughters agreed to snf placement close to [employer]. CSW will initiate snf search.   Assessment/plan status:  Psychosocial Support/Ongoing Assessment of Needs Other assessment/ plan:   and discharge planning   Information/referral to community resources:   Skilled nursing facility list    PATIENTS/FAMILYS RESPONSE TO PLAN OF CARE: Pt daughters thanked csw for concern and support. Pt currently sleeping and unable to engaged in converstaion.

## 2011-12-03 NOTE — Plan of Care (Signed)
Problem: Phase I Progression Outcomes Goal: Voiding-avoid urinary catheter unless indicated Foley Cath discontinued today

## 2011-12-03 NOTE — Progress Notes (Signed)
Physical Therapy Treatment Patient Details Name: [name] MRN: [id number] DOB: 10-11-1929 Today's Date: 12/03/2011 Time: 0755-0820 PT Time Calculation (min): 25 min  PT Assessment / Plan / Recommendation Comments on Treatment Session  Patient continues with weakness and lethargy that is inihibiting her ability to progress to ambulation with her functional mobility.     Follow Up Recommendations  Inpatient Rehab    Barriers to Discharge  None      Equipment Recommendations  Defer to next venue    Recommendations for Other Services Rehab consult  Frequency Min 3X/week   Plan Discharge plan remains appropriate;Frequency remains appropriate    Precautions / Restrictions Precautions Precautions: Fall   Pertinent Vitals/Pain Abdominal pain with bed mobility    Mobility  Bed Mobility Rolling Right: 3: Mod assist Right Sidelying to Sit: 3: Mod assist Sitting - Scoot to Edge of Bed: 3: Mod assist Details for Bed Mobility Assistance: Patient with most discomfort with rolling and transitioning to sitting. She was more acive in her particpation today and in conversation.  Transfers Sit to Stand: 3: Mod assist;From bed Stand to Sit: 3: Mod assist;To chair/3-in-1 Stand Pivot Transfers: 3: Mod assist Details for Transfer Assistance: Patient continues with weakness and again was unable to stand fully erect. Limited stepping secondary to weakness and mostly pivoted to chair.     Exercises General Exercises - Lower Extremity Ankle Circles/Pumps: AROM;Both;15 reps;Seated Long Arc Quad: AAROM;Both;15 reps;Seated Hip Flexion/Marching: AAROM;Both;15 reps;Seated    PT Goals Acute Rehab PT Goals PT Goal: Supine/Side to Sit - Progress: Progressing toward goal PT Goal: Sit to Stand - Progress: Progressing toward goal PT Goal: Stand to Sit - Progress: Progressing toward goal  Visit Information  Last PT Received On: 12/03/11 Assistance Needed: +1    Subjective Data  Subjective:  Patient reports feeling weak Patient Stated Goal: None stated   Cognition  Arousal/Alertness: Lethargic Orientation Level: Disoriented to;Time Behavior During Session: Lethargic Current Attention Level: Sustained    Balance  Static Sitting Balance Static Sitting - Balance Support: No upper extremity supported;Feet supported Static Sitting - Level of Assistance: 4: Min assist Static Sitting - Comment/# of Minutes: Sat at edge with improved upright posture today, but needs max. encouragement to raise head. Without support patient with loss of balance posteriorly.   End of Session PT - End of Session Equipment Utilized During Treatment: Gait belt Activity Tolerance: Patient limited by fatigue Patient left: in chair;with call bell/phone within reach Nurse Communication: Mobility status    Edwyna Perfect, PT  Pager [phone]  12/03/2011, 8:28 AM

## 2011-12-03 NOTE — Progress Notes (Signed)
Patient ID: ELLIETTE SEABOLT, female   DOB: 1929/08/28, 78 y.o.   MRN: [id number] 3 Days Post-Op  Subjective: Up in chair, c/o some abd pain.  Objective: Vital signs in last 24 hours: Temp:  [98 F (36.7 C)-98.4 F (36.9 C)] 98.2 F (36.8 C) (07/05 0800) Pulse Rate:  [62-72] 62  (07/05 0800) Resp:  [16-17] 17  (07/05 0800) BP: (105-143)/(63-82) 105/63 mmHg (07/05 0800) SpO2:  [98 %-100 %] 100 % (07/05 0800) Weight:  [110 lb 6.4 oz (50.077 kg)] 110 lb 6.4 oz (50.077 kg) (07/05 0400)   Intake/Output from previous day: 07/04 0701 - 07/05 0700 In: 0  Out: 300 [Urine:200; Emesis/NG output:100] Intake/Output this shift:     General appearance: frail appearing, no distress, NGT in place with bilous op Resp: clear to auscultation bilaterally Cardio: RRR GI: soft, tender to palp, incision d/c/i with staples, no bowel sounds   Lab Results:   Basename 12/02/11 0600 12/01/11 2104  WBC 6.9 7.5  HGB 9.7* 10.4*  HCT 31.0* 33.5*  PLT 239 265   BMET  Basename 12/02/11 0600  NA 142  K 3.6  CL 105  CO2 29  GLUCOSE 98  BUN 22  CREATININE 0.61  CALCIUM 8.1*   PT/INR No results found for this basename: LABPROT:2,INR:2 in the last 72 hours ABG No results found for this basename: PHART:2,PCO2:2,PO2:2,HCO3:2 in the last 72 hours  MEDS, Scheduled    . antiseptic oral rinse  15 mL Mouth Rinse BID  . aspirin EC  81 mg Oral Daily  . cefTRIAXone (ROCEPHIN)  IV  1 g Intravenous Q24H  . donepezil  5 mg Oral Daily  . furosemide  20 mg Intravenous Once  . metoprolol tartrate  25 mg Oral BID  . nitroGLYCERIN  0.5 inch Topical Q8H  . pantoprazole (PROTONIX) IV  40 mg Intravenous Q24H  . ramipril  5 mg Oral Daily  . simvastatin  20 mg Oral q1800  . sodium chloride  3 mL Intravenous Q12H  . sucralfate  1 g Oral TID WC & HS    Studies/Results: No results found.  Assessment: s/p Procedure(s): EXPLORATORY LAPAROTOMY Stable post op  Plan: Will repeat abd series today, leave NGT  in place given output, incision looks good and can leave this open to air.  Will follow up after x-ray.   LOS: 9 days     Jathen Sudano, [facility], [place] [phone]   12/03/2011 9:30 AM

## 2011-12-03 NOTE — Progress Notes (Signed)
Subjective:  Patient denies any chest pain or shortness of breath. Complains of vague generalized abdominal pain  Objective:  Vital Signs in the last 24 hours: Temp:  [98.2 F (36.8 C)-98.4 F (36.9 C)] 98.2 F (36.8 C) (07/05 0800) Pulse Rate:  [62-72] 62  (07/05 0800) Resp:  [16-17] 17  (07/05 0800) BP: (105-143)/(63-82) 105/63 mmHg (07/05 0800) SpO2:  [98 %-100 %] 100 % (07/05 0800) Weight:  [50.077 kg (110 lb 6.4 oz)] 50.077 kg (110 lb 6.4 oz) (07/05 0400)  Intake/Output from previous day: 07/04 0701 - 07/05 0700 In: 0  Out: 300 [Urine:200; Emesis/NG output:100] Intake/Output from this shift:    Physical Exam: Neck: no adenopathy, no carotid bruit, no JVD and supple, symmetrical, trachea midline Lungs: Decreased breath sound at bases with occasional rhonchi Heart: regular rate and rhythm, S1, S2 normal and Soft systolic murmur noted Abdomen: Soft bowel sounds absent generalized tenderness noted Extremities: extremities normal, atraumatic, no cyanosis or edema  Lab Results:  Basename 12/02/11 0600 12/01/11 2104  WBC 6.9 7.5  HGB 9.7* 10.4*  PLT 239 265    Basename 12/02/11 0600  NA 142  K 3.6  CL 105  CO2 29  GLUCOSE 98  BUN 22  CREATININE 0.61   No results found for this basename: TROPONINI:2,CK,MB:2 in the last 72 hours Hepatic Function Panel No results found for this basename: PROT,ALBUMIN,AST,ALT,ALKPHOS,BILITOT,BILIDIR,IBILI in the last 72 hours No results found for this basename: CHOL in the last 72 hours No results found for this basename: PROTIME in the last 72 hours  Imaging: Imaging results have been reviewed and No results found.  Cardiac Studies:  Assessment/Plan:  EXPLORATORY LAPAROTOMY  PRIMARY REPAIR OF OBTURATOR HERNIA for small bowel obstruction postop day 3 Postop ileus Status post Probable small non-Q-wave myocardial infarction  Mild decompensated heart failure secondary to valvular heart disease  Resolving Bilateral pneumonia    Hypertension  History of syncope in the past  Dementia  History of questionable seizure disorder  Chronic anemia  Plan  Continue present management per surgery  Antibiotics per surgery Dr. Algie Coffer on call for weekend  LOS: 9 days    Lexys Milliner N 12/03/2011, 12:14 PM

## 2011-12-03 NOTE — Clinical Social Work Placement (Addendum)
     Clinical Social Work Department CLINICAL SOCIAL WORK PLACEMENT NOTE 12/16/2011  Patient:  RILLA, BUCKMAN  Account Number:  [id number] Admit date:  11/24/2011  Clinical Social Worker:  Doree Albee  Date/time:  12/03/2011 04:00 PM  Clinical Social Work is seeking post-discharge placement for this patient at the following level of care:   SKILLED NURSING   (*CSW will update this form in Epic as items are completed)   12/03/2011  Patient/family provided with [facility] Department of Clinical Social Works list of facilities offering this level of care within the geographic area requested by the patient (or if unable, by the patients family).  12/03/2011  Patient/family informed of their freedom to choose among providers that offer the needed level of care, that participate in Medicare, Medicaid or managed care program needed by the patient, have an available bed and are willing to accept the patient.  12/03/2011  Patient/family informed of [facility] ownership interest in [facility], as well as of the fact that they are under no obligation to receive care at this facility.  PASARR submitted to EDS on 12/03/2011 PASARR number received from EDS on 12/03/2011  FL2 transmitted to all facilities in geographic area requested by pt/family on  12/03/2011 FL2 transmitted to all facilities within larger geographic area on   Patient informed that his/her managed care company has contracts with or will negotiate with  certain facilities, including the following:     Patient/family informed of bed offers received:  12/06/2011 Patient chooses bed at [facility] Physician recommends and patient chooses bed at    Patient to be transferred to [facility] on  12/15/2011 Patient to be transferred to facility by ptar  The following physician request were entered in Epic:   Additional Comments:

## 2011-12-03 NOTE — Progress Notes (Signed)
I agree that patient has hypoactive bowel sounds, but X-rays at this time will not change management.  No BM or flatus.  Just wait and consider clamping tube tomorrow.  Marta Lamas. Gae Bon, MD, FACS [phone] [facility]

## 2011-12-04 LAB — TYPE AND SCREEN
Donor AG Type: NEGATIVE
Donor AG Type: NEGATIVE
Unit division: 0

## 2011-12-04 LAB — GLUCOSE, CAPILLARY
Glucose-Capillary: 83 mg/dL (ref 70–99)
Glucose-Capillary: 97 mg/dL (ref 70–99)
Glucose-Capillary: 94 mg/dL (ref 70–99)

## 2011-12-04 NOTE — Progress Notes (Signed)
Subjective:  Hungry. Awake and afebrile. + abdominal discomfort.  Objective:  Vital Signs in the last 24 hours: Temp:  [97.5 F (36.4 C)-98.4 F (36.9 C)] 98.1 F (36.7 C) (07/06 0800) Pulse Rate:  [56-65] 60  (07/06 0800) Cardiac Rhythm:  [-] Normal sinus rhythm (07/05 2020) Resp:  [16-18] 17  (07/06 0800) BP: (105-122)/(62-72) 115/64 mmHg (07/06 0800) SpO2:  [92 %-100 %] 97 % (07/06 0800) Weight:  [50.758 kg (111 lb 14.4 oz)] 50.758 kg (111 lb 14.4 oz) (07/06 0400)  Physical Exam: BP Readings from Last 1 Encounters:  12/04/11 115/64    Wt Readings from Last 1 Encounters:  12/04/11 50.758 kg (111 lb 14.4 oz)    Weight change: 0.68 kg (1 lb 8 oz)  HEENT: [place]/AT, Eyes-Brown, PERL, EOMI, Conjunctiva-Pale pink, Sclera-Non-icteric Neck: No JVD, No bruit, Trachea midline. Lungs:  Clear, Bilateral. Cardiac:  Regular rhythm, normal S1 and S2, no S3.  Abdomen:  Soft, umbilical area tenderness. Midline lower abdominal scar is healing well. No BS. Extremities:  No edema present. No cyanosis. No clubbing. CNS: AxOx3, Cranial nerves grossly intact, moves all 4 extremities. Right handed. Skin: Warm and dry.   Intake/Output from previous day: 07/05 0701 - 07/06 0700 In: 600 [I.V.:600] Out: 400 [Urine:400]    Lab Results: BMET    Component Value Date/Time   NA 142 12/02/2011 0600   K 3.6 12/02/2011 0600   CL 105 12/02/2011 0600   CO2 29 12/02/2011 0600   GLUCOSE 98 12/02/2011 0600   BUN 22 12/02/2011 0600   CREATININE 0.61 12/02/2011 0600   CALCIUM 8.1* 12/02/2011 0600   GFRNONAA 84* 12/02/2011 0600   GFRAA >90 12/02/2011 0600   CBC    Component Value Date/Time   WBC 6.9 12/02/2011 0600   WBC 6.7 07/22/2006 1017   RBC 4.66 12/02/2011 0600   RBC 4.94 07/22/2006 1017   HGB 9.7* 12/02/2011 0600   HGB 11.1* 07/22/2006 1017   HCT 31.0* 12/02/2011 0600   HCT 35.2 07/22/2006 1017   PLT 239 12/02/2011 0600   PLT 223 07/22/2006 1017   MCV 66.5* 12/02/2011 0600   MCV 71.2* 07/22/2006 1017   MCH 20.8* 12/02/2011  0600   MCH 22.4* 07/22/2006 1017   MCHC 31.3 12/02/2011 0600   MCHC 31.5* 07/22/2006 1017   RDW 17.9* 12/02/2011 0600   RDW 15.2* 07/22/2006 1017   LYMPHSABS 0.8 11/25/2011 0135   LYMPHSABS 1.2 07/22/2006 1017   MONOABS 0.2 11/25/2011 0135   MONOABS 0.7 07/22/2006 1017   EOSABS 0.0 11/25/2011 0135   EOSABS 0.4 07/22/2006 1017   BASOSABS 0.0 11/25/2011 0135   BASOSABS 0.1 07/22/2006 1017   CARDIAC ENZYMES Lab Results  Component Value Date   CKTOTAL 255* 11/26/2011   CKMB 7.4* 11/26/2011   TROPONINI 0.45* 11/26/2011    Assessment/Plan:  Patient Active Hospital Problem List: EXPLORATORY LAPAROTOMY  PRIMARY REPAIR OF OBTURATOR HERNIA for small bowel obstruction postop day 3  Postop ileus  Status post Probable small non-Q-wave myocardial infarction  Mild decompensated heart failure secondary to valvular heart disease  Resolving Bilateral pneumonia  Hypertension  History of syncope in the past  Dementia  History of questionable seizure disorder  Chronic anemia  Plan  Continue present management per surgery  Antibiotics per surgery Try sips of clear liquids per surgery before removing NG tube.    LOS: 10 days    Orpah Cobb  MD  12/04/2011, 12:53 PM

## 2011-12-04 NOTE — Progress Notes (Signed)
Order to d/c NG tube is on hold right now until we know that pt is tolerating clear liquids.  Dr. Algie Coffer saw pt this afternoon after the order to d/c NG tube was placed.  Family member at bedside is giving sips of water to pt.  NG tube set to intermittent suction.  Will continue to monitor pt.

## 2011-12-04 NOTE — Progress Notes (Signed)
[facility] Progress Note 4 Days Post-Op  Subjective: Patient in no distress.  Questionable if she hs had any bowel movement or flatus  Objective: Vital signs in last 24 hours: Temp:  [97.5 F (36.4 C)-98.4 F (36.9 C)] 98.1 F (36.7 C) (07/06 0800) Pulse Rate:  [56-65] 60  (07/06 0800) Resp:  [16-18] 17  (07/06 0800) BP: (105-122)/(62-72) 115/64 mmHg (07/06 0800) SpO2:  [92 %-100 %] 97 % (07/06 0800) Weight:  [50.758 kg (111 lb 14.4 oz)] 50.758 kg (111 lb 14.4 oz) (07/06 0400) Last BM Date: 11/27/11  Intake/Output from previous day: 07/05 0701 - 07/06 0700 In: 600 [I.V.:600] Out: 400 [Urine:400] Intake/Output this shift:    General: No acute distress  Lungs: Clear  Abd: Some bowel sounds, but not very active  Extremities: No DVT signs or symptoms  Neuro: Intact   Lab Results:  @LABLAST2 (wbc:2,hgb:2,hct:2,plt:2) BMET  Basename 12/02/11 0600  NA 142  K 3.6  CL 105  CO2 29  GLUCOSE 98  BUN 22  CREATININE 0.61  CALCIUM 8.1*   PT/INR No results found for this basename: LABPROT:2,INR:2 in the last 72 hours ABG No results found for this basename: PHART:2,PCO2:2,PO2:2,HCO3:2 in the last 72 hours  Studies/Results: No results found.  Anti-infectives: Anti-infectives     Start     Dose/Rate Route Frequency Ordered Stop   11/26/11 1000   azithromycin (ZITHROMAX) tablet 250 mg        250 mg Oral Daily 11/25/11 1122 11/30/11 0959   11/25/11 2000   cefTRIAXone (ROCEPHIN) 1 g in dextrose 5 % 50 mL IVPB        1 g 100 mL/hr over 30 Minutes Intravenous Every 24 hours 11/25/11 1817     11/25/11 1130   azithromycin (ZITHROMAX) tablet 500 mg        500 mg Oral Daily 11/25/11 1122 11/25/11 1150          Assessment/Plan: s/p Procedure(s): EXPLORATORY LAPAROTOMY Advance diet Discontinue NGT Sips of clear liquids.  LOS: 10 days   Marta Lamas. Gae Bon, MD, FACS [phone] [facility] 12/04/2011

## 2011-12-05 LAB — GLUCOSE, CAPILLARY
Glucose-Capillary: 102 mg/dL — ABNORMAL HIGH (ref 70–99)
Glucose-Capillary: 102 mg/dL — ABNORMAL HIGH (ref 70–99)
Glucose-Capillary: 91 mg/dL (ref 70–99)
Glucose-Capillary: 91 mg/dL (ref 70–99)

## 2011-12-05 NOTE — Progress Notes (Signed)
5 Days Post-Op  Subjective: No n/v.  Tolerating clear liquids.  Objective: Vital signs in last 24 hours: Temp:  [97.3 F (36.3 C)-98.3 F (36.8 C)] 98.1 F (36.7 C) (07/07 0500) Pulse Rate:  [53-63] 54  (07/07 0500) Resp:  [18] 18  (07/07 0500) BP: (96-114)/(56-69) 114/69 mmHg (07/07 0500) SpO2:  [99 %-100 %] 100 % (07/07 0500) Weight:  [104 lb 4.4 oz (47.3 kg)] 104 lb 4.4 oz (47.3 kg) (07/07 0500) Last BM Date: 11/27/11  Intake/Output from previous day: 07/06 0701 - 07/07 0700 In: 1187.5 [I.V.:1187.5] Out: -  Intake/Output this shift:    PE: Abd-soft, incision clean and intact, active bowel sounds.  Lab Results:  No results found for this basename: WBC:2,HGB:2,HCT:2,PLT:2 in the last 72 hours BMET No results found for this basename: NA:2,K:2,CL:2,CO2:2,GLUCOSE:2,BUN:2,CREATININE:2,CALCIUM:2 in the last 72 hours PT/INR No results found for this basename: LABPROT:2,INR:2 in the last 72 hours Comprehensive Metabolic Panel:    Component Value Date/Time   NA 142 12/02/2011 0600   K 3.6 12/02/2011 0600   CL 105 12/02/2011 0600   CO2 29 12/02/2011 0600   BUN 22 12/02/2011 0600   CREATININE 0.61 12/02/2011 0600   GLUCOSE 98 12/02/2011 0600   CALCIUM 8.1* 12/02/2011 0600   AST 40* 09/19/2010 2253   ALT 23 09/19/2010 2253   ALKPHOS 65 09/19/2010 2253   BILITOT 0.7 09/19/2010 2253   PROT 7.4 09/19/2010 2253   ALBUMIN 3.7 09/19/2010 2253     Studies/Results: No results found.  Anti-infectives: Anti-infectives     Start     Dose/Rate Route Frequency Ordered Stop   11/26/11 1000   azithromycin (ZITHROMAX) tablet 250 mg        250 mg Oral Daily 11/25/11 1122 11/30/11 0959   11/25/11 2000   cefTRIAXone (ROCEPHIN) 1 g in dextrose 5 % 50 mL IVPB        1 g 100 mL/hr over 30 Minutes Intravenous Every 24 hours 11/25/11 1817     11/25/11 1130   azithromycin (ZITHROMAX) tablet 500 mg        500 mg Oral Daily 11/25/11 1122 11/25/11 1150          Assessment Principal Problem:  *Obturator hernia with obstruction s/p repair 7/3 Active Problems:  DEMENTIA  s/p Probable MI      LOS: 11 days   Plan: Advance to full liquids.   Adolph Pollack 12/05/2011

## 2011-12-05 NOTE — Progress Notes (Signed)
CSW provided bed offers to Pt's sister. Sister stated she would speak to Pt's daughter and they would all agree on a place. CSW stated more bed offers could come on Monday.   Lia Foyer, LCSWA [facility] Clinical Social Worker Contact #: [phone] (weekend)

## 2011-12-05 NOTE — Progress Notes (Signed)
Subjective:  Weakness and abdominal pain persist  Objective:  Vital Signs in the last 24 hours: Temp:  [97.3 F (36.3 C)-98.3 F (36.8 C)] 98.1 F (36.7 C) (07/07 0500) Pulse Rate:  [53-63] 54  (07/07 0500) Cardiac Rhythm:  [-] Normal sinus rhythm (07/06 2140) Resp:  [18] 18  (07/07 0500) BP: (96-114)/(56-69) 114/69 mmHg (07/07 0500) SpO2:  [99 %-100 %] 100 % (07/07 0500) Weight:  [47.3 kg (104 lb 4.4 oz)] 47.3 kg (104 lb 4.4 oz) (07/07 0500)  Physical Exam: BP Readings from Last 1 Encounters:  12/05/11 114/69    Wt Readings from Last 1 Encounters:  12/05/11 47.3 kg (104 lb 4.4 oz)    Weight change: -3.458 kg (-7 lb 10 oz)  HEENT: [place]/AT, Eyes-Brown, PERL, EOMI, Conjunctiva-Pale pink, Sclera-Non-icteric Neck: No JVD, No bruit, Trachea midline. Lungs:  Clear, Bilateral. Cardiac:  Regular rhythm, normal S1 and S2, no S3.  Abdomen:  Soft, epigastric and umbilical area tender. Extremities:  No edema present. No cyanosis. No clubbing. CNS: AxOx3, Cranial nerves grossly intact, moves all 4 extremities. Right handed. Skin: Warm and dry.   Intake/Output from previous day: 07/06 0701 - 07/07 0700 In: 1187.5 [I.V.:1187.5] Out: -     Lab Results: BMET    Component Value Date/Time   NA 142 12/02/2011 0600   K 3.6 12/02/2011 0600   CL 105 12/02/2011 0600   CO2 29 12/02/2011 0600   GLUCOSE 98 12/02/2011 0600   BUN 22 12/02/2011 0600   CREATININE 0.61 12/02/2011 0600   CALCIUM 8.1* 12/02/2011 0600   GFRNONAA 84* 12/02/2011 0600   GFRAA >90 12/02/2011 0600   CBC    Component Value Date/Time   WBC 6.9 12/02/2011 0600   WBC 6.7 07/22/2006 1017   RBC 4.66 12/02/2011 0600   RBC 4.94 07/22/2006 1017   HGB 9.7* 12/02/2011 0600   HGB 11.1* 07/22/2006 1017   HCT 31.0* 12/02/2011 0600   HCT 35.2 07/22/2006 1017   PLT 239 12/02/2011 0600   PLT 223 07/22/2006 1017   MCV 66.5* 12/02/2011 0600   MCV 71.2* 07/22/2006 1017   MCH 20.8* 12/02/2011 0600   MCH 22.4* 07/22/2006 1017   MCHC 31.3 12/02/2011 0600   MCHC 31.5*  07/22/2006 1017   RDW 17.9* 12/02/2011 0600   RDW 15.2* 07/22/2006 1017   LYMPHSABS 0.8 11/25/2011 0135   LYMPHSABS 1.2 07/22/2006 1017   MONOABS 0.2 11/25/2011 0135   MONOABS 0.7 07/22/2006 1017   EOSABS 0.0 11/25/2011 0135   EOSABS 0.4 07/22/2006 1017   BASOSABS 0.0 11/25/2011 0135   BASOSABS 0.1 07/22/2006 1017   CARDIAC ENZYMES Lab Results  Component Value Date   CKTOTAL 255* 11/26/2011   CKMB 7.4* 11/26/2011   TROPONINI 0.45* 11/26/2011    Assessment/Plan:  Patient Active Hospital Problem List: EXPLORATORY LAPAROTOMY  PRIMARY REPAIR OF OBTURATOR HERNIA for small bowel obstruction postop day 5  Postop ileus  Status post Probable small non-Q-wave myocardial infarction  Mild decompensated heart failure secondary to valvular heart disease  Resolving Bilateral pneumonia  Hypertension  History of syncope in the past  Dementia  History of questionable seizure disorder  Chronic anemia  Plan  Continue present management per surgery  Antibiotics per surgery  Try sips of clear liquids per surgery before removing NG tube.   Labs in AM. Increase activity or rehab or NH.     LOS: 11 days    Orpah Cobb  MD  12/05/2011, 8:46 AM

## 2011-12-06 LAB — CBC
Hemoglobin: 9.7 g/dL — ABNORMAL LOW (ref 12.0–15.0)
Platelets: 206 10*3/uL (ref 150–400)
RBC: 4.63 MIL/uL (ref 3.87–5.11)
WBC: 5.2 10*3/uL (ref 4.0–10.5)

## 2011-12-06 LAB — BASIC METABOLIC PANEL
BUN: 11 mg/dL (ref 6–23)
Chloride: 102 mEq/L (ref 96–112)
GFR calc Af Amer: >90 mL/min (ref >90)
GFR calc non Af Amer: 88 mL/min — ABNORMAL LOW (ref >90)
Potassium: 3.3 mEq/L — ABNORMAL LOW (ref 3.5–5.1)
Sodium: 136 mEq/L (ref 135–145)

## 2011-12-06 LAB — COMPREHENSIVE METABOLIC PANEL
ALT: 11 U/L (ref 0–35)
AST: 22 U/L (ref 0–37)
Alkaline Phosphatase: 47 U/L (ref 39–117)
CO2: 25 mEq/L (ref 19–32)
GFR calc Af Amer: >90 mL/min (ref >90)
GFR calc non Af Amer: 90 mL/min — ABNORMAL LOW (ref >90)
Glucose, Bld: 101 mg/dL — ABNORMAL HIGH (ref 70–99)
Potassium: 3 mEq/L — ABNORMAL LOW (ref 3.5–5.1)
Sodium: 135 mEq/L (ref 135–145)

## 2011-12-06 LAB — GLUCOSE, CAPILLARY
Glucose-Capillary: 79 mg/dL (ref 70–99)
Glucose-Capillary: 93 mg/dL (ref 70–99)
Glucose-Capillary: 79 mg/dL (ref 70–99)

## 2011-12-06 MED ORDER — POTASSIUM CHLORIDE 10 MEQ/100ML IV SOLN
10.0000 meq | INTRAVENOUS | Status: AC
  Administered 2011-12-06 (×2): 10 meq via INTRAVENOUS
  Filled 2011-12-06 (×4): qty 100

## 2011-12-06 MED ORDER — KCL IN DEXTROSE-NACL 20-5-0.45 MEQ/L-%-% IV SOLN
INTRAVENOUS | Status: DC
  Administered 2011-12-06: 50 mL/h via INTRAVENOUS
  Administered 2011-12-07: 10:00:00 via INTRAVENOUS
  Filled 2011-12-06 (×3): qty 1000

## 2011-12-06 MED ORDER — POTASSIUM CHLORIDE CRYS ER 20 MEQ PO TBCR
20.0000 meq | EXTENDED_RELEASE_TABLET | Freq: Once | ORAL | Status: AC
  Administered 2011-12-06: 20 meq via ORAL
  Filled 2011-12-06: qty 1

## 2011-12-06 NOTE — Progress Notes (Signed)
6 Days Post-Op  Subjective: Comfortable Not taking much po  Objective: Vital signs in last 24 hours: Temp:  [97.7 F (36.5 C)-98.2 F (36.8 C)] 98.2 F (36.8 C) (07/08 0500) Pulse Rate:  [58-67] 58  (07/08 0500) Resp:  [18-20] 18  (07/08 0500) BP: (89-110)/(51-62) 110/59 mmHg (07/08 0500) SpO2:  [91 %-96 %] 96 % (07/08 0500) Weight:  [106 lb 7.7 oz (48.3 kg)] 106 lb 7.7 oz (48.3 kg) (07/08 0500) Last BM Date: 11/27/11  Intake/Output from previous day:   Intake/Output this shift:    Abdomen soft, non distended, incision clean  Lab Results:   [facility] 12/06/11 0625  WBC 5.2  HGB 9.7*  HCT 30.1*  PLT 206   BMET  Basename 12/06/11 0625  NA 135  K 3.0*  CL 101  CO2 25  GLUCOSE 101*  BUN 11  CREATININE 0.50  CALCIUM 8.0*   PT/INR No results found for this basename: LABPROT:2,INR:2 in the last 72 hours ABG No results found for this basename: PHART:2,PCO2:2,PO2:2,HCO3:2 in the last 72 hours  Studies/Results: No results found.  Anti-infectives: Anti-infectives     Start     Dose/Rate Route Frequency Ordered Stop   11/26/11 1000   azithromycin (ZITHROMAX) tablet 250 mg        250 mg Oral Daily 11/25/11 1122 11/30/11 0959   11/25/11 2000   cefTRIAXone (ROCEPHIN) 1 g in dextrose 5 % 50 mL IVPB        1 g 100 mL/hr over 30 Minutes Intravenous Every 24 hours 11/25/11 1817     11/25/11 1130   azithromycin (ZITHROMAX) tablet 500 mg        500 mg Oral Daily 11/25/11 1122 11/25/11 1150          Assessment/Plan: s/p Procedure(s) (LRB): EXPLORATORY LAPAROTOMY (N/A)  Encourage po PT  LOS: 12 days    Reina Wilton A 12/06/2011

## 2011-12-06 NOTE — Progress Notes (Signed)
Subjective:  Patient denies any chest pain or shortness of breath appetite poor. Abdominal pain improved  Objective:  Vital Signs in the last 24 hours: Temp:  [97.7 F (36.5 C)-98.2 F (36.8 C)] 98.2 F (36.8 C) (07/08 0500) Pulse Rate:  [58-67] 58  (07/08 0943) Resp:  [18-20] 18  (07/08 0500) BP: (89-110)/(51-62) 91/55 mmHg (07/08 0943) SpO2:  [91 %-96 %] 96 % (07/08 0500) Weight:  [48.3 kg (106 lb 7.7 oz)] 48.3 kg (106 lb 7.7 oz) (07/08 0500)  Intake/Output from previous day:   Intake/Output from this shift:    Physical Exam: Neck: no adenopathy, no carotid bruit, no JVD and supple, symmetrical, trachea midline Lungs: Decreased breath sound at bases with occasional rhonchi Heart: regular rate and rhythm, S1, S2 normal and Soft systolic murmur noted Abdomen: Soft mild generalized tenderness surgical site clear bowel sounds present Extremities: extremities normal, atraumatic, no cyanosis or edema  Lab Results:  [facility] 12/06/11 0625  WBC 5.2  HGB 9.7*  PLT 206    Basename 12/06/11 0625  NA 135  K 3.0*  CL 101  CO2 25  GLUCOSE 101*  BUN 11  CREATININE 0.50   No results found for this basename: TROPONINI:2,CK,MB:2 in the last 72 hours Hepatic Function Panel  Basename 12/06/11 0625  PROT 6.1  ALBUMIN 2.0*  AST 22  ALT 11  ALKPHOS 47  BILITOT 0.3  BILIDIR --  IBILI --   No results found for this basename: CHOL in the last 72 hours No results found for this basename: PROTIME in the last 72 hours  Imaging: Imaging results have been reviewed and No results found.  Cardiac Studies:  Assessment/Plan:  EXPLORATORY LAPAROTOMY  PRIMARY REPAIR OF OBTURATOR HERNIA for small bowel obstruction postop day 6 Postop ileus  Status post Probable small non-Q-wave myocardial infarction  Compensated diastolic heart failure Status post Bilateral pneumonia  Hypertension  History of syncope in the past  Dementia  History of questionable seizure disorder  Chronic  anemia  Hypokalemia Plan  Replace K. Increase ambulation Increase by mouth intake as tolerated  LOS: 12 days    Emmitte Surgeon N 12/06/2011, 12:13 PM

## 2011-12-06 NOTE — Progress Notes (Signed)
Physical Therapy Treatment Patient Details Name: [name] MRN: [id number] DOB: [birth year] Today's Date: 12/06/2011 Time: 4098-1191 PT Time Calculation (min): 25 min  PT Assessment / Plan / Recommendation Comments on Treatment Session  Patient with improved attnetion and alertness today resulting in increased active participation. However, patient has become considerably weaker than prior to surgery secondary to limited mobility resulting in weakness. She would highly benefit from rehab.     Follow Up Recommendations  Inpatient Rehab    Barriers to Discharge  None      Equipment Recommendations  Defer to next venue    Recommendations for Other Services Rehab consult  Frequency Min 3X/week   Plan Discharge plan remains appropriate;Frequency remains appropriate    Precautions / Restrictions Precautions Precautions: Fall   Pertinent Vitals/Pain VSS/ Abdominal pain with rolling/side to sit    Mobility  Bed Mobility Rolling Right: 4: Min assist Right Sidelying to Sit: 3: Mod assist Sitting - Scoot to Edge of Bed: 3: Mod assist Details for Bed Mobility Assistance: Patient with improved active participation and so required less assistance for bed mobility today. Patient with difficulty initiating transition from side lying and requires assistance through range.  Transfers Sit to Stand: 3: Mod assist;With upper extremity assist;From bed Stand to Sit: With armrests;With upper extremity assist;To chair/3-in-1;4: Min assist Details for Transfer Assistance: Patient with difficulty initiating stand from bed and requires assistance for entire transition secondary to weakness and also needs hand over hand for correct hand placement Ambulation/Gait Ambulation/Gait Assistance: 3: Mod assist Ambulation Distance (Feet): 4 Feet Assistive device: Rolling walker Ambulation/Gait Assistance Details: Patient needs max. cues for posture and step sequence as otherwise will stand flexed and not  initiate stepping.  Gait Pattern: Step-through pattern;Decreased stride length;Trunk flexed    Exercises General Exercises - Lower Extremity Long Arc Quad: AROM;Both;10 reps;Seated Heel Slides: AAROM;Both;10 reps;Supine Hip ABduction/ADduction: AAROM;Both;10 reps;Supine (Seated hip adduction squeezes x 10 bil. ) Hip Flexion/Marching: AROM;Both;10 reps;Seated Toe Raises: AROM;Both;10 reps;Seated Heel Raises: AROM;Both;10 reps;Seated    PT Goals Acute Rehab PT Goals Time For Goal Achievement: 12/13/11 Potential to Achieve Goals: Good Pt will go Supine/Side to Sit: with supervision PT Goal: Supine/Side to Sit - Progress: Goal set today Pt will go Sit to Supine/Side: with supervision PT Goal: Sit to Supine/Side - Progress: Goal set today Pt will go Sit to Stand: with supervision;with upper extremity assist PT Goal: Sit to Stand - Progress: Goal set today Pt will go Stand to Sit: with supervision;with upper extremity assist PT Goal: Stand to Sit - Progress: Goal set today Pt will Ambulate: 16 - 50 feet;with supervision;with least restrictive assistive device PT Goal: Ambulate - Progress: Goal set today PT Goal: Up/Down Stairs - Progress: Discontinued (comment)  Visit Information  Last PT Received On: 12/06/11 Assistance Needed: +1    Subjective Data  Subjective: Patient reports feeling better, but weak. Patient Stated Goal: Increase strength.   Cognition  Orientation Level: Disoriented to;Time Current Attention Level: Sustained Following Commands: Follows multi-step commands inconsistently    Balance  Static Sitting Balance Static Sitting - Balance Support: Feet supported;Bilateral upper extremity supported Static Sitting - Level of Assistance: 4: Min assist Static Sitting - Comment/# of Minutes: Secondary for tendency to lean posteriorly and to the right. Static Standing Balance Static Standing - Balance Support: Bilateral upper extremity supported Static Standing - Level  of Assistance: 4: Min assist Static Standing - Comment/# of Minutes: At support of walker, but needs assistance secondary to posterior lean.  End of Session PT - End of Session Equipment Utilized During Treatment: Gait belt Activity Tolerance: Patient limited by fatigue Patient left: in chair;with call bell/phone within reach;with family/visitor present Nurse Communication: Mobility status   Edwyna Perfect, PT  Pager [phone]  12/06/2011, 1:49 PM

## 2011-12-06 NOTE — Progress Notes (Signed)
bmet at 1015 am showed K=3.3 Dr. Sharyn Lull notified new orders received to only give 2 of the 4 runs odered and the 20 po po ordered.

## 2011-12-06 NOTE — Progress Notes (Signed)
CSW spoke with pt daughter Maurilio Lovely regarding bed offers. CSW provided pt daughter with updated bed offers. Pt daughter wanted to discuss with other pt daughter the updated bed offers. Pt plans [facility]. CSW will follow up with pt daughter first thing tomorrow morning.   .Clinical social worker continuing to follow pt to assist with pt dc plans and further csw needs.   [name], [name]  [phone] .12/06/2011  1530pm

## 2011-12-07 LAB — GLUCOSE, CAPILLARY
Glucose-Capillary: 109 mg/dL — ABNORMAL HIGH (ref 70–99)
Glucose-Capillary: 112 mg/dL — ABNORMAL HIGH (ref 70–99)
Glucose-Capillary: 77 mg/dL (ref 70–99)
Glucose-Capillary: 93 mg/dL (ref 70–99)

## 2011-12-07 LAB — BASIC METABOLIC PANEL
Calcium: 8 mg/dL — ABNORMAL LOW (ref 8.4–10.5)
GFR calc Af Amer: >90 mL/min (ref >90)
GFR calc non Af Amer: 90 mL/min — ABNORMAL LOW (ref >90)
Glucose, Bld: 81 mg/dL (ref 70–99)
Potassium: 3.7 mEq/L (ref 3.5–5.1)
Sodium: 136 mEq/L (ref 135–145)

## 2011-12-07 LAB — CBC
Hemoglobin: 9.6 g/dL — ABNORMAL LOW (ref 12.0–15.0)
MCH: 20.4 pg — ABNORMAL LOW (ref 26.0–34.0)
Platelets: 172 10*3/uL (ref 150–400)
RBC: 4.71 MIL/uL (ref 3.87–5.11)
WBC: 4 10*3/uL (ref 4.0–10.5)

## 2011-12-07 MED ORDER — PANTOPRAZOLE SODIUM 40 MG PO TBEC
40.0000 mg | DELAYED_RELEASE_TABLET | Freq: Every day | ORAL | Status: DC
  Administered 2011-12-07 – 2011-12-15 (×9): 40 mg via ORAL
  Filled 2011-12-07 (×8): qty 1

## 2011-12-07 MED ORDER — ENSURE COMPLETE PO LIQD
237.0000 mL | Freq: Two times a day (BID) | ORAL | Status: DC
  Administered 2011-12-07 – 2011-12-15 (×12): 237 mL via ORAL

## 2011-12-07 MED ORDER — METOPROLOL TARTRATE 12.5 MG HALF TABLET
12.5000 mg | ORAL_TABLET | Freq: Two times a day (BID) | ORAL | Status: DC
  Administered 2011-12-07 – 2011-12-15 (×13): 12.5 mg via ORAL
  Filled 2011-12-07 (×17): qty 1

## 2011-12-07 NOTE — Progress Notes (Signed)
Occupational Therapy Treatment Patient Details Name: SYRA SIRMONS MRN: [id number] DOB: 1929-06-13 Today's Date: 12/07/2011 Time: 1478-2956 OT Time Calculation (min): 29 min  OT Assessment / Plan / Recommendation Comments on Treatment Session Pt. c/o weakness in LE's after ambulating from bed to sink and had to sit. Pt. with decreased attention and difficulty following commands especially with using walker correctly.    Follow Up Recommendations  Skilled nursing facility;Supervision/Assistance - 24 hour    Barriers to Discharge       Equipment Recommendations  Defer to next venue    Recommendations for Other Services    Frequency Min 2X/week   Plan Discharge plan needs to be updated    Precautions / Restrictions Precautions Precautions: Fall   Pertinent Vitals/Pain Vitals Monitored and Stable. Pain 9/10 in abdomen.    ADL  Eating/Feeding: Performed;Set up Where Assessed - Eating/Feeding: Chair Grooming: Performed;Brushing hair;Set up Where Assessed - Grooming: Supported sitting Toilet Transfer: Simulated;Moderate assistance Toilet Transfer Method: Sit to stand Toilet Transfer Equipment: Regular height toilet Equipment Used: Gait belt;Rolling walker Transfers/Ambulation Related to ADLs: Pt. required vc's and physical faciliation to stand upright due to flexed posture.  ADL Comments: Pt. ambulated from bed to sink with RW and had to sit due to weakness in LE's. Pt. brushed hair while sitting supported and was eating lunch at end of session.         OT Goals Acute Rehab OT Goals OT Goal Formulation: With patient/family Time For Goal Achievement: 12/13/11 Potential to Achieve Goals: Good ADL Goals Pt Will Perform Grooming: Standing at sink;with set-up ADL Goal: Grooming - Progress: Not progressing Pt Will Perform Upper Body Bathing: Sitting at sink;with set-up Pt Will Perform Lower Body Bathing: Sit to stand from chair;with set-up Pt Will Perform Upper Body Dressing:  with set-up;Sitting, bed;Sitting, chair Pt Will Perform Lower Body Dressing: Sit to stand from bed;Sit to stand from chair;with set-up Pt Will Transfer to Toilet: 3-in-1;Ambulation;with supervision ADL Goal: Toilet Transfer - Progress: Not progressing Pt Will Perform Toileting - Clothing Manipulation: Standing;with supervision Pt Will Perform Toileting - Hygiene: with modified independence;Leaning right and/or left on 3-in-1/toilet Pt Will Perform Tub/Shower Transfer: Tub transfer;with supervision;Transfer tub bench  Visit Information  Last OT Received On: 12/07/11 Assistance Needed: +1    Subjective Data   Pt. Was pleasant in session.   Prior Functioning       Cognition  Overall Cognitive Status: Impaired Area of Impairment: Attention;Following commands Arousal/Alertness: Awake/alert Orientation Level: Appears intact for tasks assessed Behavior During Session: [facility] for tasks performed Current Attention Level: Sustained Attention - Other Comments: Up to 1 minute with sustained attentio. Following Commands: Follows multi-step commands inconsistently    Mobility Bed Mobility Bed Mobility: Rolling Right;Right Sidelying to Sit;Sitting - Scoot to Edge of Bed Rolling Right: With rail;5: Supervision Right Sidelying to Sit: 4: Min assist;HOB elevated Sitting - Scoot to Edge of Bed: 4: Min assist Details for Bed Mobility Assistance: vc's for sequencing of bed mobility. Pt. required minimal physical assist Transfers Transfers: Sit to Stand;Stand to Sit Sit to Stand: From bed;With upper extremity assist;3: Mod assist Stand to Sit: 4: Min guard;To chair/3-in-1;With upper extremity assist Details for Transfer Assistance: Pt. requires cues to correct posture as well as walker safety and staying inside the walker when ambulating.       Balance Static Standing Balance Static Standing - Balance Support: Bilateral upper extremity supported Static Standing - Level of Assistance: 4: Min  assist Static Standing - Comment/# of Minutes:  Requires facilitation for hip extension.   End of Session OT - End of Session Activity Tolerance: Patient limited by fatigue Patient left: in chair;with call bell/phone within reach   GO     Straub, Mardella Layman 12/07/2011, 1:21 PM   Lucile Shutters   OTR/L Pager: [phone] Office: [phone] .

## 2011-12-07 NOTE — Progress Notes (Signed)
Subjective:  Patient denies any chest pain or shortness of breath. States abdominal pain has improved no BM yet. States she is passing gas  Objective:  Vital Signs in the last 24 hours: Temp:  [97.3 F (36.3 C)-97.5 F (36.4 C)] 97.3 F (36.3 C) (07/09 0500) Pulse Rate:  [51-58] 57  (07/09 0948) Resp:  [18] 18  (07/09 0500) BP: (99-115)/(59-69) 115/68 mmHg (07/09 0948) SpO2:  [98 %-100 %] 98 % (07/09 0500) Weight:  [47.5 kg (104 lb 11.5 oz)] 47.5 kg (104 lb 11.5 oz) (07/09 0500)  Intake/Output from previous day: 07/08 0701 - 07/09 0700 In: -  Out: 150 [Urine:150] Intake/Output from this shift:    Physical Exam: Neck: no adenopathy, no carotid bruit, no JVD and supple, symmetrical, trachea midline Lungs: Decreased breath sound at bases with bibasilar Rales and occasional rhonchi Heart: regular rate and rhythm, S1, S2 normal and Soft systolic murmur noted Abdomen: soft, non-tender; bowel sounds normal; no masses,  no organomegaly Extremities: extremities normal, atraumatic, no cyanosis or edema  Lab Results:  Basename 12/07/11 0615 12/06/11 0625  WBC 4.0 5.2  HGB 9.6* 9.7*  PLT 172 206    Basename 12/07/11 0615 12/06/11 1015  NA 136 136  K 3.7 3.3*  CL 105 102  CO2 25 26  GLUCOSE 81 107*  BUN 8 11  CREATININE 0.50 0.53   No results found for this basename: TROPONINI:2,CK,MB:2 in the last 72 hours Hepatic Function Panel  Basename 12/06/11 0625  PROT 6.1  ALBUMIN 2.0*  AST 22  ALT 11  ALKPHOS 47  BILITOT 0.3  BILIDIR --  IBILI --   No results found for this basename: CHOL in the last 72 hours No results found for this basename: PROTIME in the last 72 hours  Imaging: Imaging results have been reviewed and No results found.  Cardiac Studies:  Assessment/Plan:  EXPLORATORY LAPAROTOMY  PRIMARY REPAIR OF OBTURATOR HERNIA for small bowel obstruction postop day 6  Postop ileus  Status post Probable small non-Q-wave myocardial infarction  Compensated  diastolic heart failure  Status post Bilateral pneumonia  Hypertension  History of syncope in the past  Dementia  History of questionable seizure disorder  Chronic anemia Plan DC IV fluid Increase ambulation Encourage by mouth  LOS: 13 days    Autumm Hattery N 12/07/2011, 11:38 AM

## 2011-12-07 NOTE — Progress Notes (Signed)
Physical Therapy Treatment Patient Details Name: [name] MRN: [id number] DOB: [birth year] Today's Date: 12/07/2011 Time: 4098-1191 PT Time Calculation (min): 25 min  PT Assessment / Plan / Recommendation Comments on Treatment Session  Patient with improved functional activity tolerance and attention to task today. She remains an excellent rehab candidate due to extent of social support available at discharge.     Follow Up Recommendations  Inpatient Rehab    Barriers to Discharge  None      Equipment Recommendations  Defer to next venue    Recommendations for Other Services Rehab consult  Frequency Min 3X/week   Plan Discharge plan needs to be updated;Frequency remains appropriate    Precautions / Restrictions Precautions Precautions: Fall Restrictions Weight Bearing Restrictions: No   Pertinent Vitals/Pain VSS/ Abdominal pain -pre-medicated.     Mobility  Bed Mobility Rolling Right: 4: Min guard;With rail Right Sidelying to Sit: 4: Min assist Sitting - Scoot to Edge of Bed: 4: Min assist Details for Bed Mobility Assistance: significantly improved trunk initiation and stability upon assuming seated position. Verbal cues for sequencing of bed mobilty to decr. abdominal pain.  Transfers Sit to Stand: 3: Mod assist;With upper extremity assist;From bed Stand to Sit: 4: Min guard;With upper extremity assist;With armrests;To chair/3-in-1 Details for Transfer Assistance: Patient required verbal cues for sit <> stand for correct hand placement. She had improved initiation but requires hand over hand to place both hands on walker and tactile cues for posture. With cueing to sit patient able to control descent to chair well until latter 10%.  Ambulation/Gait Ambulation/Gait Assistance: 4: Min assist Ambulation Distance (Feet): 8 Feet Assistive device: Rolling walker Ambulation/Gait Assistance Details: Improved stability in standing. Decreased posterior lean. Verbal cues for  posture as tendency for flexed psoture and to look at feet. Good step continuity until fatigue and then attention lost.  Gait Pattern: Trunk flexed;Step-through pattern;Decreased stride length    Exercises General Exercises - Lower Extremity Quad Sets: AROM;Both;10 reps;Supine Long Arc Quad: AROM;Both;15 reps;Seated Hip ABduction/ADduction: AROM;15 reps;Seated;Both (hip adduction squeezes) Hip Flexion/Marching: AROM;Both;15 reps;Seated Toe Raises: AROM;Both;15 reps;Seated Heel Raises: AROM;Both;15 reps;Seated    PT Goals Acute Rehab PT Goals PT Goal: Supine/Side to Sit - Progress: Progressing toward goal PT Goal: Sit to Stand - Progress: Progressing toward goal PT Goal: Stand to Sit - Progress: Progressing toward goal PT Goal: Ambulate - Progress: Progressing toward goal  Visit Information  Last PT Received On: 12/07/11 Assistance Needed: +1    Subjective Data  Subjective: Patient reports "yeah we are' when asked about walking today.  Patient Stated Goal: Continue to get stronger   Cognition  Area of Impairment: Attention Arousal/Alertness: Awake/alert Orientation Level: Disoriented to;Time Behavior During Session: [facility] for tasks performed Current Attention Level: Sustained Attention - Other Comments: Up to 1 minute with sustained attentio. Following Commands: Follows multi-step commands inconsistently;Follows one step commands consistently    Pension scheme manager Standing - Balance Support: Bilateral upper extremity supported Static Standing - Level of Assistance: 4: Min assist Static Standing - Comment/# of Minutes: Requires facilitation for hip extension.   End of Session PT - End of Session Equipment Utilized During Treatment: Gait belt Activity Tolerance: Patient tolerated treatment well Patient left: in chair;with call bell/phone within reach;with family/visitor present Nurse Communication: Mobility status    Edwyna Perfect, PT  Pager  [phone]  12/07/2011, 9:58 AM

## 2011-12-07 NOTE — Progress Notes (Signed)
CSW left message with both pt daughters to confirm choice of snf when pt is medically stable for discharge. .Clinical social worker continuing to follow pt to assist with pt dc plans and further csw needs.   [name], [name]  [phone] .12/07/2011  1138am

## 2011-12-07 NOTE — Progress Notes (Signed)
Utilization review completed.  

## 2011-12-07 NOTE — Progress Notes (Signed)
Patient is receiving Protonix by the IV route.  Pt meets the P & T approved criteria for changing to oral administration.  - No GI bleeding  - Tolerating an oral or per tube diet  - Taking other oral or per tube medications.  Will change patient to Protonix 40mg  PO daily per P&T policy.  **Noted pt on day #13 of Rocephin for ?PNA.  Afebrile, WBC wnl, no culture data.  Consider d/c antibiotics as well.  Thank you. [employer], Pharm.D., BCPS Clinical Pharmacist Pager [phone]

## 2011-12-07 NOTE — Progress Notes (Signed)
Occupational Therapy Treatment Patient Details Name: [name] MRN: [id number] DOB: 03-07-1930 Today's Date: 12/07/2011 Time: 1210-1221 OT Time Calculation (min): 11 min  OT Assessment / Plan / Recommendation Comments on Treatment Session Pt. reported she did not have to void in previous session. Once OT had left the room and walked by, pt. reported she had to use the bathroom. Pt. had already voided in chair and soiled gown but was unaware she had done so.     Follow Up Recommendations  Skilled nursing facility;Supervision/Assistance - 24 hour    Barriers to Discharge       Equipment Recommendations  Defer to next venue    Recommendations for Other Services    Frequency Min 2X/week   Plan Discharge plan needs to be updated    Precautions / Restrictions Precautions Precautions: Fall Restrictions Weight Bearing Restrictions: No   Pertinent Vitals/Pain Vitals Monitored and Stable. Pain 9/10 in abdomen.     ADL  Eating/Feeding: Performed;Set up Where Assessed - Eating/Feeding: Chair Grooming: Performed;Brushing hair;Set up Where Assessed - Grooming: Supported sitting Toilet Transfer: Performed;Moderate assistance Toilet Transfer Method: Sit to Barista: Bedside commode Toileting - Clothing Manipulation and Hygiene: Performed;Maximal assistance (Max A for clothing manip. and Min A for hygiene (balance)) Where Assessed - Toileting Clothing Manipulation and Hygiene: Standing Equipment Used: Gait belt;Rolling walker Transfers/Ambulation Related to ADLs: Pt. required vc's to scoot to edge of chair and hand placement for standing.  ADL Comments: After pt. was set up for eating and OT left, pt. said she had to use the bathroom. Pt. performed transfer to bedside commode but had already voided bladder in chair. Pt. unaware that she had voided in chair and soaked gown.       OT Goals Acute Rehab OT Goals OT Goal Formulation: With patient/family Time For  Goal Achievement: 12/13/11 Potential to Achieve Goals: Good ADL Goals Pt Will Perform Grooming: Standing at sink;with set-up ADL Goal: Grooming - Progress: Not progressing Pt Will Perform Upper Body Bathing: Sitting at sink;with set-up Pt Will Perform Lower Body Bathing: Sit to stand from chair;with set-up Pt Will Perform Upper Body Dressing: with set-up;Sitting, bed;Sitting, chair Pt Will Perform Lower Body Dressing: Sit to stand from bed;Sit to stand from chair;with set-up Pt Will Transfer to Toilet: 3-in-1;Ambulation;with supervision ADL Goal: Toilet Transfer - Progress: Not progressing Pt Will Perform Toileting - Clothing Manipulation: Standing;with supervision ADL Goal: Toileting - Clothing Manipulation - Progress: Not progressing Pt Will Perform Toileting - Hygiene: with modified independence;Leaning right and/or left on 3-in-1/toilet ADL Goal: Toileting - Hygiene - Progress: Progressing toward goals Pt Will Perform Tub/Shower Transfer: Tub transfer;with supervision;Transfer tub bench  Visit Information  Last OT Received On: 12/07/11 Assistance Needed: +1    Subjective Data   "I need to use the bathroom."   Prior Functioning       Cognition  Overall Cognitive Status: Impaired Area of Impairment: Attention;Following commands Arousal/Alertness: Awake/alert Orientation Level: Appears intact for tasks assessed Behavior During Session: [facility] for tasks performed Current Attention Level: Sustained Attention - Other Comments: Up to 1 minute with sustained attentio. Following Commands: Follows multi-step commands inconsistently    Mobility Bed Mobility Bed Mobility: Not assessed Rolling Right: With rail;5: Supervision Right Sidelying to Sit: 4: Min assist;HOB elevated Sitting - Scoot to Edge of Bed: 4: Min assist Details for Bed Mobility Assistance: vc's for sequencing of bed mobility. Pt. required minimal physical assist Transfers Transfers: Sit to Stand;Stand to Sit Sit to  Stand: From  chair/3-in-1;3: Mod assist;With upper extremity assist Stand to Sit: To toilet;3: Mod assist (required physical assist to guide hips to sit on commode. ) Details for Transfer Assistance: Min G for stand to sit on chair. Mod A for stand to sit on commode due to phsyical assist to guide hips to sit on commode.       Balance Static Standing Balance Static Standing - Balance Support: Bilateral upper extremity supported Static Standing - Level of Assistance: 4: Min assist Static Standing - Comment/# of Minutes: Requires facilitation for hip extension.   End of Session OT - End of Session Activity Tolerance: Patient limited by fatigue Patient left: in chair;with call bell/phone within reach Nurse Communication: Other (comment) (Notified Nurse Tech that pt. was up in chair with no alarm)  GO     Straub, Lindsey 12/07/2011, 1:38 PM . Lucile Shutters   OTR/L Pager: [phone] Office: [phone] .

## 2011-12-07 NOTE — Progress Notes (Signed)
Patient ID: [name], female   DOB: 20-Jan-1930, 78 y.o.   MRN: [id number] 7 Days Post-Op  Subjective: Complains of intermittent abdominal pain +flatus Taking some po  Objective: Vital signs in last 24 hours: Temp:  [97.3 F (36.3 C)-97.5 F (36.4 C)] 97.3 F (36.3 C) (07/09 0500) Pulse Rate:  [51-58] 51  (07/09 0500) Resp:  [18] 18  (07/09 0500) BP: (91-115)/(55-69) 115/69 mmHg (07/09 0500) SpO2:  [98 %-100 %] 98 % (07/09 0500) Weight:  [104 lb 11.5 oz (47.5 kg)] 104 lb 11.5 oz (47.5 kg) (07/09 0500) Last BM Date: 11/27/11  Intake/Output from previous day: 07/08 0701 - 07/09 0700 In: -  Out: 150 [Urine:150] Intake/Output this shift:    PE: Abdomen totally soft, minimally tender, non distended, incision clean  Lab Results:   Basename 12/07/11 0615 12/06/11 0625  WBC 4.0 5.2  HGB 9.6* 9.7*  HCT 30.5* 30.1*  PLT 172 206   BMET  Basename 12/07/11 0615 12/06/11 1015  NA 136 136  K 3.7 3.3*  CL 105 102  CO2 25 26  GLUCOSE 81 107*  BUN 8 11  CREATININE 0.50 0.53  CALCIUM 8.0* 8.0*   PT/INR No results found for this basename: LABPROT:2,INR:2 in the last 72 hours Comprehensive Metabolic Panel:    Component Value Date/Time   NA 136 12/07/2011 0615   K 3.7 12/07/2011 0615   CL 105 12/07/2011 0615   CO2 25 12/07/2011 0615   BUN 8 12/07/2011 0615   CREATININE 0.50 12/07/2011 0615   GLUCOSE 81 12/07/2011 0615   CALCIUM 8.0* 12/07/2011 0615   AST 22 12/06/2011 0625   ALT 11 12/06/2011 0625   ALKPHOS 47 12/06/2011 0625   BILITOT 0.3 12/06/2011 0625   PROT 6.1 12/06/2011 0625   ALBUMIN 2.0* 12/06/2011 0625     Studies/Results: No results found.  Anti-infectives: Anti-infectives     Start     Dose/Rate Route Frequency Ordered Stop   11/26/11 1000   azithromycin (ZITHROMAX) tablet 250 mg        250 mg Oral Daily 11/25/11 1122 11/30/11 0959   11/25/11 2000   cefTRIAXone (ROCEPHIN) 1 g in dextrose 5 % 50 mL IVPB        1 g 100 mL/hr over 30 Minutes Intravenous Every 24 hours  11/25/11 1817     11/25/11 1130   azithromycin (ZITHROMAX) tablet 500 mg        500 mg Oral Daily 11/25/11 1122 11/25/11 1150          Assessment Principal Problem:  *Obturator hernia with obstruction Active Problems:  DEMENTIA  HYPERTENSION  SYNCOPE  SEIZURE DISORDER  Implantable loop recorder  Hypoxia  CAP (community acquired pneumonia)  Elevated troponin s/p exp lap   LOS: 13 days   Plan: continue PT/OT          Encourage po           To SNF at discharge  Nikodem Leadbetter A 12/07/2011

## 2011-12-07 NOTE — Progress Notes (Addendum)
INITIAL ADULT NUTRITION ASSESSMENT Date: 12/07/2011   Time: 2:19 PM Reason for Assessment: poor PO  ASSESSMENT: Female 78 y.o.  Dx: Obturator hernia with obstruction  Hx:  Past Medical History  Diagnosis Date  . Syncope   . Unspecified cardiac device in situ     Loop recorder  . Atypical chest pain     Nonobstructive catheterization; negative Myoview 2011  . CVA 11/23/2008  . SEIZURE DISORDER 11/23/2008  . DEMENTIA 11/23/2008  . HYPERTENSION 11/23/2008  . Coronary artery disease   . Obturator hernia with obstruction 12/02/2011   Past Surgical History  Procedure Date  . Loop recorder insertion   . Colonoscopy   . Eye surgery   . Cataract extraction, bilateral   . Laparotomy 11/30/2011    Procedure: EXPLORATORY LAPAROTOMY;  Surgeon: Liz Malady, MD;  Location: [facility] OR;  Service: General;  Laterality: N/A;  Exploratory Laparotomy with Primary Repair Obturator Hernia    Related Meds:  Scheduled Meds:   . antiseptic oral rinse  15 mL Mouth Rinse BID  . aspirin EC  81 mg Oral Daily  . cefTRIAXone (ROCEPHIN)  IV  1 g Intravenous Q24H  . donepezil  5 mg Oral Daily  . metoprolol tartrate  12.5 mg Oral BID  . pantoprazole  40 mg Oral Q1200  . potassium chloride  10 mEq Intravenous Q1 Hr x 4  . ramipril  5 mg Oral Daily  . simvastatin  20 mg Oral q1800  . sodium chloride  3 mL Intravenous Q12H  . sucralfate  1 g Oral TID WC & HS  . DISCONTD: furosemide  20 mg Intravenous Once  . DISCONTD: metoprolol tartrate  25 mg Oral BID  . DISCONTD: pantoprazole (PROTONIX) IV  40 mg Intravenous Q24H   Continuous Infusions:   . DISCONTD: dextrose 5 % and 0.45 % NaCl with KCl 20 mEq/L 50 mL/hr at 12/07/11 0949   PRN Meds:.sodium chloride, acetaminophen, alum & mag hydroxide-simeth, guaiFENesin-dextromethorphan, magnesium hydroxide, nitroGLYCERIN, ondansetron (ZOFRAN) IV, sodium chloride, DISCONTD:  morphine injection, DISCONTD:  morphine injection   Ht: 4\' 11"  (149.9 cm)  Wt: 104 lb 11.5  oz (47.5 kg)  Ideal Wt: 98 lbs % Ideal Wt: 105%  Usual Wt: pt does not know Wt Readings from Last 10 Encounters:  12/07/11 104 lb 11.5 oz (47.5 kg)  12/07/11 104 lb 11.5 oz (47.5 kg)  12/07/11 104 lb 11.5 oz (47.5 kg)  09/02/10 130 lb (58.968 kg)  07/28/10 132 lb (59.875 kg)  02/04/10 126 lb (57.153 kg)  01/27/10 127 lb 12.8 oz (57.97 kg)  04/08/09 118 lb (53.524 kg)  11/26/08 130 lb (58.968 kg)    Body mass index is 21.15 kg/(m^2).  Food/Nutrition Related Hx: lives at home alone, eating per her usual PTA, increased SHOB  Labs:  CMP     Component Value Date/Time   NA 136 12/07/2011 0615   K 3.7 12/07/2011 0615   CL 105 12/07/2011 0615   CO2 25 12/07/2011 0615   GLUCOSE 81 12/07/2011 0615   BUN 8 12/07/2011 0615   CREATININE 0.50 12/07/2011 0615   CALCIUM 8.0* 12/07/2011 0615   PROT 6.1 12/06/2011 0625   ALBUMIN 2.0* 12/06/2011 0625   AST 22 12/06/2011 0625   ALT 11 12/06/2011 0625   ALKPHOS 47 12/06/2011 0625   BILITOT 0.3 12/06/2011 0625   GFRNONAA 90* 12/07/2011 0615   GFRAA >90 12/07/2011 0615    Intake: 50% x1 Output:   Intake/Output Summary (Last 24 hours) at  12/07/11 1421 Last data filed at 12/06/11 2015  Gross per 24 hour  Intake      0 ml  Output    150 ml  Net   -150 ml   No BM, +flatus  Diet Order: Full Liquid  Supplements/Tube Feeding:  None at this time  IVF:    DISCONTD: dextrose 5 % and 0.45 % NaCl with KCl 20 mEq/L Last Rate: 50 mL/hr at 12/07/11 0949    Estimated Nutritional Needs:   Kcal: 1040-1180 kcal Protein: 47-56g Fluid: 1.4 L/day  Pt admitted with shortness of breath, found to have obturator hernia with obstruction.  Pt is 6 days s/p ex lap, now with post-op ileus.  Pt was NPO for 7 days, then advanced to clears and full liquids, and has not been able to advance diet further due to lack of bowel function.  Pt has been on unsupplemented liquid diet for 13 days.  RD observed lunch tray with only yogurt consumed.  Pt states she does not like jello or pudding,  and does not drink milk.  Pt has difficulty describing her appetite; has not noticed a change in appetite, but admits she is not eating very much. When discussing meals at home, pt states she serves herself a small portion.  When asked if she could eat more than she is served, pt states 'Yes, would they do that for me?'  RD notes pt lives at home alone, and could not determine who pt was referring to. When asked if she serves herself a smaller portion purposefully, pt states 'that's all they put on my plate.' RD performed a nutrition-focused physical exam and noted pt with decreased subcutaneous fat mass at temples, clavicles, and sternum.  Wasting noted at arms with decreased muscle mass.   RD notes pt is planning to d/c to SNF which may improve pt's intake.  Pt qualifies for severe malnutrition of chronic illness based on dietary recall of pt likely meeting <75% of needs for >1 months, as well as severe depletion of muscle and subcutaneous fat mass.  Pt has not had a bowel movement since admission (13 days).  She does not know when her last BM was PTA.  Pt has not been meeting her needs for at least 13 days with questionable nutrition status PTA AEB recall and nutrition-related dx of malnutrition.  If pt is to start nutrition support, recommend careful advancements with close monitoring of electrolytes.  NUTRITION DIAGNOSIS: -Inadequate oral intake (NI-2.1).  Status: Ongoing  RELATED TO: omission of energy dense foods  AS EVIDENCE BY: pt on unsupplemented liquid diet for >10 days  MONITORING/EVALUATION(Goals): 1.  Food/Beverage; diet advancement with tolerance to Regular goal 2.  Gastrointestinal; return of normal bowel function  EDUCATION NEEDS: -Education needs addressed with pt re: increased kcal and protein intake.  INTERVENTION: 1.  Modify diet; per MD discretion to Regular goal 2.  Supplements; Ensure Complete TID between meals to assist pt in meeting protein needs. 3.  Nutrition  support; if pt continues with poor PO intake and slow advancement, recommend initiation of nutrition support.  Pt has been tolerating full liquids and would likely tolerate Vital 1.2 @ 10 mL/hr initial trickle feeds.  Dietitian #: [phone]  DOCUMENTATION CODES Per approved criteria  -Severe malnutrition in the context of chronic illness    Hoyt Koch 12/07/2011, 2:19 PM

## 2011-12-08 LAB — GLUCOSE, CAPILLARY
Glucose-Capillary: 70 mg/dL (ref 70–99)
Glucose-Capillary: 86 mg/dL (ref 70–99)

## 2011-12-08 NOTE — Progress Notes (Signed)
I have seen and examined the patient and agree with the assessment and plans.  Arriyana Rodell A. Kasey Hansell  MD, FACS  

## 2011-12-08 NOTE — Progress Notes (Signed)
Subjective:  Patient denies any chest pain or shortness of breath. Abdominal pain slowly improving. No BM yet  Objective:  Vital Signs in the last 24 hours: Temp:  [97.1 F (36.2 C)-98.3 F (36.8 C)] 97.9 F (36.6 C) (07/10 0500) Pulse Rate:  [55-59] 56  (07/10 1047) Resp:  [18-20] 20  (07/10 0500) BP: (99-116)/(56-70) 103/58 mmHg (07/10 1046) SpO2:  [95 %-100 %] 100 % (07/10 0500) Weight:  [47 kg (103 lb 9.9 oz)] 47 kg (103 lb 9.9 oz) (07/10 0500)  Intake/Output from previous day: 07/09 0701 - 07/10 0700 In: 3 [I.V.:3] Out: -  Intake/Output from this shift:    Physical Exam: Neck: no adenopathy, no carotid bruit, no JVD and supple, symmetrical, trachea midline Lungs: Decreased breath sound at bases Heart: regular rate and rhythm, S1, S2 normal and Soft systolic murmur noted Abdomen: Soft bowel sounds present mild generalized tenderness Extremities: extremities normal, atraumatic, no cyanosis or edema  Lab Results:  Basename 12/07/11 0615 12/06/11 0625  WBC 4.0 5.2  HGB 9.6* 9.7*  PLT 172 206    Basename 12/07/11 0615 12/06/11 1015  NA 136 136  K 3.7 3.3*  CL 105 102  CO2 25 26  GLUCOSE 81 107*  BUN 8 11  CREATININE 0.50 0.53   No results found for this basename: TROPONINI:2,CK,MB:2 in the last 72 hours Hepatic Function Panel  Basename 12/06/11 0625  PROT 6.1  ALBUMIN 2.0*  AST 22  ALT 11  ALKPHOS 47  BILITOT 0.3  BILIDIR --  IBILI --   No results found for this basename: CHOL in the last 72 hours No results found for this basename: PROTIME in the last 72 hours  Imaging: Imaging results have been reviewed and No results found.  Cardiac Studies:  Assessment/Plan:  EXPLORATORY LAPAROTOMY  PRIMARY REPAIR OF OBTURATOR HERNIA for small bowel obstruction postop day 7 Postop ileus  Status post Probable small non-Q-wave myocardial infarction  Compensated diastolic heart failure  Status post Bilateral pneumonia  Hypertension  History of syncope in the  past  Dementia  History of questionable seizure disorder  Chronic anemia  Plan Continue present management Advance diet per surgery Increase ambulation Social service for discharge planning  LOS: 14 days    Juda Toepfer N 12/08/2011, 12:06 PM

## 2011-12-08 NOTE — Progress Notes (Signed)
Patient ID: [name], female   DOB: [birth year], 78 y.o.   MRN: [id number] Patient ID: [name], female   DOB: 04-Dec-1929, 78 y.o.   MRN: [id number] 8 Days Post-Op  Subjective: Doing well this morning, no c/o of abdominal pan today. Still with scant po intake. No UOP recorded.  Objective: Vital signs in last 24 hours: Temp:  [97.1 F (36.2 C)-98.3 F (36.8 C)] 97.9 F (36.6 C) (07/10 0500) Pulse Rate:  [55-59] 56  (07/10 0500) Resp:  [18-20] 20  (07/10 0500) BP: (99-116)/(56-70) 116/70 mmHg (07/10 0500) SpO2:  [95 %-100 %] 100 % (07/10 0500) Weight:  [103 lb 9.9 oz (47 kg)] 103 lb 9.9 oz (47 kg) (07/10 0500) Last BM Date:  (prior to admission per patient)  Intake/Output from previous day: 07/09 0701 - 07/10 0700 In: 3 [I.V.:3] Out: -  Intake/Output this shift:    PE: Abdomen soft, minimally tender, non distended, incision clean, staples intact, no erythema. +BS, Flatus. Chest: CTA Extremities: No edema  Lab Results:   Basename 12/07/11 0615 12/06/11 0625  WBC 4.0 5.2  HGB 9.6* 9.7*  HCT 30.5* 30.1*  PLT 172 206   BMET  Basename 12/07/11 0615 12/06/11 1015  NA 136 136  K 3.7 3.3*  CL 105 102  CO2 25 26  GLUCOSE 81 107*  BUN 8 11  CREATININE 0.50 0.53  CALCIUM 8.0* 8.0*   PT/INR No results found for this basename: LABPROT:2,INR:2 in the last 72 hours Comprehensive Metabolic Panel:    Component Value Date/Time   NA 136 12/07/2011 0615   K 3.7 12/07/2011 0615   CL 105 12/07/2011 0615   CO2 25 12/07/2011 0615   BUN 8 12/07/2011 0615   CREATININE 0.50 12/07/2011 0615   GLUCOSE 81 12/07/2011 0615   CALCIUM 8.0* 12/07/2011 0615   AST 22 12/06/2011 0625   ALT 11 12/06/2011 0625   ALKPHOS 47 12/06/2011 0625   BILITOT 0.3 12/06/2011 0625   PROT 6.1 12/06/2011 0625   ALBUMIN 2.0* 12/06/2011 0625     Studies/Results: No results found.  Anti-infectives: Anti-infectives     Start     Dose/Rate Route Frequency Ordered Stop   11/26/11 1000   azithromycin (ZITHROMAX) tablet  250 mg        250 mg Oral Daily 11/25/11 1122 11/30/11 0959   11/25/11 2000   cefTRIAXone (ROCEPHIN) 1 g in dextrose 5 % 50 mL IVPB  Status:  Discontinued        1 g 100 mL/hr over 30 Minutes Intravenous Every 24 hours 11/25/11 1817 12/08/11 0919   11/25/11 1130   azithromycin (ZITHROMAX) tablet 500 mg        500 mg Oral Daily 11/25/11 1122 11/25/11 1150          Assessment Principal Problem:  *Obturator hernia with obstruction Active Problems:  DEMENTIA  HYPERTENSION  SYNCOPE  SEIZURE DISORDER  Implantable loop recorder  Hypoxia  CAP (community acquired pneumonia)  Elevated troponin s/p exp lap   LOS: 14 days   Plan:  1. Continue to encourage PO intake, (may need some IVF if UOP does not improve.)  2. Medical management per medicine 3. Continue with PT/OT 4. To SNF at discharge  Blenda Mounts 12/08/2011

## 2011-12-08 NOTE — Progress Notes (Signed)
Bladder scan completed with a volume of 30 cc. Pt had been voiding and had been bed changed x 3 since start of shift. See NT charting. Thanks  Ancil Linsey Rn

## 2011-12-08 NOTE — Progress Notes (Signed)
Pt is none diabetic and we are getting her CBG q 4H post surgery. Could we d/c or decrease frequency of CBG check .Thanks. Ancil Linsey RN

## 2011-12-08 NOTE — Progress Notes (Signed)
Pt plans to dc to [facility] when pt is medically stable. .Clinical social worker continuing to follow pt to assist with pt dc plans and further csw needs.   [name], [name]  [phone] .12/08/2011 1649pm

## 2011-12-09 LAB — BASIC METABOLIC PANEL
BUN: 12 mg/dL (ref 6–23)
CO2: 26 mEq/L (ref 19–32)
Calcium: 8 mg/dL — ABNORMAL LOW (ref 8.4–10.5)
Creatinine, Ser: 0.57 mg/dL (ref 0.50–1.10)
Glucose, Bld: 86 mg/dL (ref 70–99)

## 2011-12-09 LAB — CBC
HCT: 27.9 % — ABNORMAL LOW (ref 36.0–46.0)
Hemoglobin: 8.8 g/dL — ABNORMAL LOW (ref 12.0–15.0)
MCH: 20.6 pg — ABNORMAL LOW (ref 26.0–34.0)
MCV: 65.3 fL — ABNORMAL LOW (ref 78.0–100.0)
Platelets: 175 10*3/uL (ref 150–400)
RBC: 4.27 MIL/uL (ref 3.87–5.11)

## 2011-12-09 MED ORDER — MAGNESIUM HYDROXIDE 400 MG/5ML PO SUSP
30.0000 mL | Freq: Every day | ORAL | Status: DC | PRN
  Administered 2011-12-09: 30 mL via ORAL
  Filled 2011-12-09 (×2): qty 30

## 2011-12-09 NOTE — Progress Notes (Signed)
Subjective:  Patient denies any chest pain or shortness of breath appetite remains poor  Objective:  Vital Signs in the last 24 hours: Temp:  [98.3 F (36.8 C)-98.8 F (37.1 C)] 98.8 F (37.1 C) (07/11 0800) Pulse Rate:  [50-75] 57  (07/11 1021) Resp:  [16-18] 16  (07/11 0800) BP: (93-108)/(56-69) 95/58 mmHg (07/11 0800) SpO2:  [93 %-100 %] 100 % (07/11 0800) Weight:  [49.76 kg (109 lb 11.2 oz)] 49.76 kg (109 lb 11.2 oz) (07/11 0400)  Intake/Output from previous day:   Intake/Output from this shift: Total I/O In: 3 [I.V.:3] Out: -   Physical Exam: Neck: no adenopathy, no carotid bruit, no JVD and supple, symmetrical, trachea midline Lungs: Decreased breath sound at bases with occasional rhonchi Heart: regular rate and rhythm, S1, S2 normal and Soft systolic murmur noted Abdomen: soft, non-tender; bowel sounds normal; no masses,  no organomegaly Extremities: extremities normal, atraumatic, no cyanosis or edema  Lab Results:  [facility] 12/09/11 0620 12/07/11 0615  WBC 3.8* 4.0  HGB 8.8* 9.6*  PLT 175 172    Basename 12/09/11 0620 12/07/11 0615  NA 137 136  K 4.0 3.7  CL 104 105  CO2 26 25  GLUCOSE 86 81  BUN 12 8  CREATININE 0.57 0.50   No results found for this basename: TROPONINI:2,CK,MB:2 in the last 72 hours Hepatic Function Panel No results found for this basename: PROT,ALBUMIN,AST,ALT,ALKPHOS,BILITOT,BILIDIR,IBILI in the last 72 hours No results found for this basename: CHOL in the last 72 hours No results found for this basename: PROTIME in the last 72 hours  Imaging: Imaging results have been reviewed and No results found.  Cardiac Studies:  Assessment/Plan:  EXPLORATORY LAPAROTOMY  PRIMARY REPAIR OF OBTURATOR HERNIA for small bowel obstruction postop day 8 Postop ileus  Status post Probable small non-Q-wave myocardial infarction  Compensated diastolic heart failure  Status post Bilateral pneumonia  Hypertension  History of syncope in the past    Dementia  History of questionable seizure disorder  Chronic anemia  Plan Increase ambulation advance diet per surgery. Will DC to skilled nursing facility once okay from surgical point of view  LOS: 15 days    Danetta Prom N 12/09/2011, 11:21 AM

## 2011-12-09 NOTE — Progress Notes (Signed)
I have seen and examined the patient and agree with the assessment and plans.  Todd Argabright A. Huck Ashworth  MD, FACS  

## 2011-12-09 NOTE — Progress Notes (Signed)
Orthopedic Tech Progress Note Patient Details:  Monica ERKKILA 1930/05/04 [id number]  Ortho Devices Type of Ortho Device: Abdominal binder   Tajuana Kniskern T 12/09/2011, 11:40 AM

## 2011-12-09 NOTE — Progress Notes (Signed)
Patient ID: SHURONDA SANTINO, female   DOB: 07/06/1929, 78 y.o.   MRN: [id number] Patient ID: SHERMIKA BALTHASER, female   DOB: January 01, 1930, 78 y.o.   MRN: [id number] Patient ID: FLOREE ZUNIGA, female   DOB: 26-Dec-1929, 78 y.o.   MRN: [id number] 9 Days Post-Op  Subjective: Doing well this morning, no c/o of abdominal pan today. Scant po intake, remains an issue. Bladder scan results done yesterday negative for retention.  Objective: Vital signs in last 24 hours: Temp:  [98.3 F (36.8 C)-98.8 F (37.1 C)] 98.8 F (37.1 C) (07/11 0800) Pulse Rate:  [50-75] 54  (07/11 0800) Resp:  [16-18] 16  (07/11 0800) BP: (93-108)/(56-69) 95/58 mmHg (07/11 0800) SpO2:  [93 %-100 %] 100 % (07/11 0800) Weight:  [109 lb 11.2 oz (49.76 kg)] 109 lb 11.2 oz (49.76 kg) (07/11 0400) Last BM Date:  (prior to admission per patient)  Intake/Output from previous day:   Intake/Output this shift:    PE: Abdomen soft, minimally tender, non distended, incision clean, staples intact, no erythema. +BS, Flatus, no BM to date.. Chest: CTA Extremities: No edema  Lab Results:   Basename 12/09/11 0620 12/07/11 0615  WBC 3.8* 4.0  HGB 8.8* 9.6*  HCT 27.9* 30.5*  PLT PENDING 172   BMET  Basename 12/09/11 0620 12/07/11 0615  NA 137 136  K 4.0 3.7  CL 104 105  CO2 26 25  GLUCOSE 86 81  BUN 12 8  CREATININE 0.57 0.50  CALCIUM 8.0* 8.0*   PT/INR No results found for this basename: LABPROT:2,INR:2 in the last 72 hours Comprehensive Metabolic Panel:    Component Value Date/Time   NA 137 12/09/2011 0620   K 4.0 12/09/2011 0620   CL 104 12/09/2011 0620   CO2 26 12/09/2011 0620   BUN 12 12/09/2011 0620   CREATININE 0.57 12/09/2011 0620   GLUCOSE 86 12/09/2011 0620   CALCIUM 8.0* 12/09/2011 0620   AST 22 12/06/2011 0625   ALT 11 12/06/2011 0625   ALKPHOS 47 12/06/2011 0625   BILITOT 0.3 12/06/2011 0625   PROT 6.1 12/06/2011 0625   ALBUMIN 2.0* 12/06/2011 0625     Studies/Results: No results  found.  Anti-infectives: Anti-infectives     Start     Dose/Rate Route Frequency Ordered Stop   11/26/11 1000   azithromycin (ZITHROMAX) tablet 250 mg        250 mg Oral Daily 11/25/11 1122 11/30/11 0959   11/25/11 2000   cefTRIAXone (ROCEPHIN) 1 g in dextrose 5 % 50 mL IVPB  Status:  Discontinued        1 g 100 mL/hr over 30 Minutes Intravenous Every 24 hours 11/25/11 1817 12/08/11 0919   11/25/11 1130   azithromycin (ZITHROMAX) tablet 500 mg        500 mg Oral Daily 11/25/11 1122 11/25/11 1150          Assessment Principal Problem:  *Obturator hernia with obstruction Active Problems:  DEMENTIA  HYPERTENSION  SYNCOPE  SEIZURE DISORDER  Implantable loop recorder  Hypoxia  CAP (community acquired pneumonia)  Elevated troponin s/p exp lap   LOS: 15 days   Plan:  1. Continue to encourage PO intake. 2. Medical management per medicine 3. Continue with PT/OT 4. Will change prn MOM to daily until BM occurs 5. Add abdominal binder 6. To SNF at discharge  Blenda Mounts 12/09/2011

## 2011-12-09 NOTE — Plan of Care (Signed)
Problem: Phase II Progression Outcomes Goal: Return of bowel function (flatus, BM) IF ABDOMINAL SURGERY:  Outcome: Completed/Met Date Met:  12/09/11 PATIENT HAD LARGE BOWEL MOVEMENT TODAY AFTER RECEIVING MOM THIS MORNING AND ENSURE MILK SHAKE THIS AFTERNOON.

## 2011-12-09 NOTE — Plan of Care (Signed)
Problem: Phase II Progression Outcomes Goal: Return of bowel function (flatus, BM) IF ABDOMINAL SURGERY:  Outcome: Progressing PASSING FLATUS, NO BM AS OF YET, MOM GIVEN THIS AM Goal: Tolerating diet Outcome: Progressing TOLERATING FULL LIQUIDS  Problem: Phase III Progression Outcomes Goal: Voiding independently Outcome: Completed/Met Date Met:  12/09/11 INCONTINENT

## 2011-12-09 NOTE — Progress Notes (Signed)
Physical Therapy Treatment Patient Details Name: [name] MRN: [id number] DOB: [birth year] Today's Date: 12/09/2011 Time: 1000-1025 PT Time Calculation (min): 25 min  PT Assessment / Plan / Recommendation Comments on Treatment Session  Patient with siginficantly improved mobility today.     Follow Up Recommendations  Inpatient Rehab    Barriers to Discharge  None      Equipment Recommendations  Defer to next venue    Recommendations for Other Services Rehab consult  Frequency Min 3X/week   Plan Discharge plan remains appropriate;Frequency remains appropriate    Precautions / Restrictions Precautions Precautions: Fall   Pertinent Vitals/Pain VSS/ no pain    Mobility  Bed Mobility Rolling Right: 4: Min guard Right Sidelying to Sit: 4: Min assist Details for Bed Mobility Assistance: Patient sleeping most of the morning. Difficult to keep awake at times but once seated at edge of bed patient awake and alert.  Transfers Sit to Stand: 4: Min assist;With upper extremity assist;From bed Stand to Sit: With upper extremity assist;4: Min guard;To chair/3-in-1;With armrests Details for Transfer Assistance: Significantly improved initiation of stand and control of descent. Hand over hand for correct hand placement Ambulation/Gait Ambulation/Gait Assistance: 4: Min assist Ambulation Distance (Feet): 25 Feet Assistive device: Rolling walker Ambulation/Gait Assistance Details: Improved posture and stride length/speed of gait and step continuity improved - patient no longer randomly stopping every 2-3 steps. Verbal cues to encourage maximal distance.  Gait Pattern: Step-through pattern;Decreased stride length;Trunk flexed    Exercises General Exercises - Lower Extremity Ankle Circles/Pumps: AROM;Both;15 reps;Supine Short Arc Quad: AROM;Both;15 reps;Supine Long Arc Quad: AROM;Both;10 reps;Seated Heel Slides: AAROM;Both;15 reps;Supine Hip ABduction/ADduction: AAROM;Both;15  reps;Supine    PT Goals Acute Rehab PT Goals PT Goal: Supine/Side to Sit - Progress: Progressing toward goal PT Goal: Sit to Stand - Progress: Progressing toward goal PT Goal: Stand to Sit - Progress: Progressing toward goal PT Goal: Ambulate - Progress: Progressing toward goal  Visit Information  Last PT Received On: 12/09/11 Assistance Needed: +1    Subjective Data  Subjective: Patient without complaints   Cognition  Area of Impairment: Attention Arousal/Alertness: Awake/alert Orientation Level: Appears intact for tasks assessed Behavior During Session: [facility] for tasks performed Current Attention Level: Selective Following Commands: Follows one step commands consistently;Follows multi-step commands inconsistently    Balance  High Level Balance High Level Balance Activites: Turns High Level Balance Comments: Close guarding mostly with gait but then turns with minimal assistance.   End of Session PT - End of Session Equipment Utilized During Treatment: Gait belt Activity Tolerance: Patient tolerated treatment well Patient left: in chair;with call bell/phone within reach Nurse Communication: Mobility status    Edwyna Perfect, PT  Pager [phone]  12/09/2011, 10:58 AM

## 2011-12-09 NOTE — Plan of Care (Signed)
Problem: Phase II Progression Outcomes Goal: Ambulates up to 600 ft. in hall x 1 Outcome: Progressing Ambulating with physical therapy short distances Goal: Tolerates diet Outcome: Progressing Tolerating full liquids

## 2011-12-10 ENCOUNTER — Inpatient Hospital Stay (HOSPITAL_COMMUNITY): Payer: Medicare Other

## 2011-12-10 MED ORDER — ENOXAPARIN SODIUM 40 MG/0.4ML ~~LOC~~ SOLN
40.0000 mg | Freq: Every day | SUBCUTANEOUS | Status: DC
  Administered 2011-12-10 – 2011-12-13 (×4): 40 mg via SUBCUTANEOUS
  Filled 2011-12-10 (×4): qty 0.4

## 2011-12-10 MED ORDER — ALBUTEROL SULFATE (5 MG/ML) 0.5% IN NEBU
2.5000 mg | INHALATION_SOLUTION | Freq: Four times a day (QID) | RESPIRATORY_TRACT | Status: DC
  Administered 2011-12-10 – 2011-12-14 (×13): 2.5 mg via RESPIRATORY_TRACT
  Filled 2011-12-10 (×14): qty 0.5

## 2011-12-10 MED ORDER — POTASSIUM CHLORIDE CRYS ER 20 MEQ PO TBCR
20.0000 meq | EXTENDED_RELEASE_TABLET | Freq: Every day | ORAL | Status: DC
  Administered 2011-12-10 – 2011-12-15 (×6): 20 meq via ORAL
  Filled 2011-12-10 (×6): qty 1

## 2011-12-10 MED ORDER — VANCOMYCIN HCL 1000 MG IV SOLR
750.0000 mg | INTRAVENOUS | Status: AC
  Administered 2011-12-10 – 2011-12-13 (×4): 750 mg via INTRAVENOUS
  Filled 2011-12-10 (×5): qty 750

## 2011-12-10 MED ORDER — PIPERACILLIN-TAZOBACTAM 3.375 G IVPB
3.3750 g | Freq: Three times a day (TID) | INTRAVENOUS | Status: DC
  Administered 2011-12-10 – 2011-12-15 (×16): 3.375 g via INTRAVENOUS
  Filled 2011-12-10 (×18): qty 50

## 2011-12-10 MED ORDER — FUROSEMIDE 10 MG/ML IJ SOLN
20.0000 mg | Freq: Every day | INTRAMUSCULAR | Status: DC
Start: 2011-12-10 — End: 2011-12-14
  Administered 2011-12-10 – 2011-12-14 (×5): 20 mg via INTRAVENOUS
  Filled 2011-12-10 (×5): qty 2

## 2011-12-10 NOTE — Progress Notes (Signed)
Physical Therapy Treatment Patient Details Name: [name] MRN: [id number] DOB: 1930/05/01 Today's Date: 12/10/2011 Time: 6578-4696 PT Time Calculation (min): 19 min  PT Assessment / Plan / Recommendation Comments on Treatment Session  Patient is making excellent progress and anticipate return home post a SNF stay.     Follow Up Recommendations  Skilled nursing facility    Barriers to Discharge  None      Equipment Recommendations  Defer to next venue    Recommendations for Other Services  None  Frequency Min 3X/week   Plan Discharge plan remains appropriate;Frequency remains appropriate    Precautions / Restrictions Precautions Precautions: Fall   Pertinent Vitals/Pain VSS/ No pain    Mobility  Bed Mobility Rolling Right: 4: Min guard;With rail Right Sidelying to Sit: 4: Min assist Sitting - Scoot to Edge of Bed: 4: Min assist Details for Bed Mobility Assistance: Patient more awake and alert today leading to more consistent performance.  Transfers Sit to Stand: 4: Min assist;With upper extremity assist;From bed Stand to Sit: 4: Min assist;With upper extremity assist;To chair/3-in-1 Details for Transfer Assistance: Continued cues required for correct hand placement to and from device.   Ambulation/Gait Ambulation/Gait Assistance: 4: Min assist Ambulation Distance (Feet): 35 Feet Assistive device: Rolling walker Ambulation/Gait Assistance Details: Assistance requird to move walker with turns and backward stepping. Patient able to maintain balance without assistance.  Verbal cues for posture and attention to task in distractive environment. Gait Pattern: Trunk flexed;Step-through pattern;Decreased stride length     PT Goals Acute Rehab PT Goals PT Goal: Supine/Side to Sit - Progress: Progressing toward goal PT Goal: Sit to Stand - Progress: Progressing toward goal PT Goal: Stand to Sit - Progress: Progressing toward goal PT Goal: Ambulate - Progress: Progressing  toward goal  Visit Information  Last PT Received On: 12/10/11 Assistance Needed: +1    Subjective Data  Subjective: Patient reports "I know I can do more now"  Patient Stated Goal: Home as soon as she can   Cognition  Arousal/Alertness: Awake/alert Orientation Level: Appears intact for tasks assessed Behavior During Session: [facility] for tasks performed Current Attention Level: Selective Following Commands: Follows multi-step commands inconsistently    Balance  High Level Balance High Level Balance Comments: Close guarding only with gait including turns today.   End of Session PT - End of Session Equipment Utilized During Treatment: Gait belt Activity Tolerance: Patient tolerated treatment well Patient left: in chair;with call bell/phone within reach;with family/visitor present Nurse Communication: Mobility status   Edwyna Perfect, PT  Pager [phone]  12/10/2011, 12:07 PM

## 2011-12-10 NOTE — Progress Notes (Signed)
Patient's chest xray report shows worsening pulmonary edema with atelectasis or bilateral pneumonia.  Results called to Dr. Sharyn Lull.  Orders received for blood cultures, IV antibiotics per pharmacy, and IV lasix.  Also informed Dr. Sharyn Lull of pain in left leg.  Lovenox ordered and MD to assess on rounds.  Will continue to [employer], Neoma Laming

## 2011-12-10 NOTE — Progress Notes (Signed)
ANTIBIOTIC CONSULT NOTE - INITIAL  Pharmacy Consult for Vanco/Zosyn/Lovenox Indication: pneumonia  Allergies  Allergen Reactions  . Codeine     Patient Measurements: Height: 4\' 11"  (149.9 cm) Weight: 110 lb 10.7 oz (50.2 kg) IBW/kg (Calculated) : 43.2  Adjusted Body Weight: 50 kg  Vital Signs: Temp: 98.4 F (36.9 C) (07/12 0800) Temp src: Oral (07/12 0800) BP: 137/79 mmHg (07/12 0800) Pulse Rate: 61  (07/12 0800) Intake/Output from previous day: 07/11 0701 - 07/12 0700 In: 126 [P.O.:120; I.V.:6] Out: -  Intake/Output from this shift:    Labs:  [facility] 12/09/11 0620  WBC 3.8*  HGB 8.8*  PLT 175  LABCREA --  CREATININE 0.57   Estimated Creatinine Clearance: 38.9 ml/min (by C-G formula based on Cr of 0.57). No results found for this basename: VANCOTROUGH:2,VANCOPEAK:2,VANCORANDOM:2,GENTTROUGH:2,GENTPEAK:2,GENTRANDOM:2,TOBRATROUGH:2,TOBRAPEAK:2,TOBRARND:2,AMIKACINPEAK:2,AMIKACINTROU:2,AMIKACIN:2, in the last 72 hours   Microbiology: Recent Results (from the past 720 hour(s))  SURGICAL PCR SCREEN     Status: Abnormal   Collection Time   11/30/11 10:48 PM      Component Value Range Status Comment   MRSA, PCR NEGATIVE  NEGATIVE Final    Staphylococcus aureus POSITIVE (*) NEGATIVE Final     Medical History: Past Medical History  Diagnosis Date  . Syncope   . Unspecified cardiac device in situ     Loop recorder  . Atypical chest pain     Nonobstructive catheterization; negative Myoview 2011  . CVA 11/23/2008  . SEIZURE DISORDER 11/23/2008  . DEMENTIA 11/23/2008  . HYPERTENSION 11/23/2008  . Coronary artery disease   . Obturator hernia with obstruction 12/02/2011    Medications:  Prescriptions prior to admission  Medication Sig Dispense Refill  . donepezil (ARICEPT) 5 MG tablet Take 1 tablet by mouth daily.      Marland Kitchen HYDROcodone-acetaminophen (VICODIN) 5-500 MG per tablet Take 1 tablet by mouth as needed.      . Multiple Vitamin (MULTIVITAMIN) tablet Take 1 tablet  by mouth daily.        Marland Kitchen NEXIUM 40 MG capsule Take 1 tablet by mouth daily.      . polyethylene glycol (MIRALAX / GLYCOLAX) packet Take 17 g by mouth daily.         Assessment: 78 yo female admitted 11/25/11 with complaints of SOB and left sided discomfort. S/P ex lap for bowel obstruction on 12/01/11.  Anticoagulation - Start Lovenox for VTE prophylaxis at 40mg /d. RN notes pain in left leg. f/u  Infectious Disease - s/p Ceftriaxone x 14 days - afebrile, WBC 3.8. Now to start Vanco/Zosyn for ?PNA? Patient's chest xray report shows worsening pulmonary edema with atelectasis or bilateral pneumonia. Results called to Dr. Sharyn Lull. coarse rhonchi and exp wheeze  Cardiovascular - hx. CVA, loop recorder, HTN - likely MI this admit. BP 137/79, 61 this am. Meds: IV Lasix, ASA 81mg , metoprolol, Altace, Zocor  Endocrinology - No hx. DM   Gastrointestinal / Nutrition - GERD - PPI/Sucralfate CT=small bowel hernia causing small bowel obstruction. S/p exploratory lap/surgery 12/01/11.  Neurology - Seizure d/o + dementia - Donepezil, (don't see that she has been on anti-seizure meds - looks like she was taken off of Keppra in 2010 as episodes thougt to be syncopal in nature)  Nephrology - Scr 0.57 with estimated CrCl 38.  Pulmonary - Albuterol nebs.  Goal of Therapy:  Vancomycin trough level 15-20 mcg/ml  Plan:  Zosyn 3.375g IV q8h. Vancomycin 750mg  IV q24h. Trough after 3-5 doses at steady state.   Merilynn Finland, [employer] 12/10/2011,9:57 AM

## 2011-12-10 NOTE — Progress Notes (Signed)
CSW continuing to follow to assist with pt dc plans. Pt discharge anticipated for today, however discharge may be delayed due to increased respiratory distress per discussion with pt RN. Pt plans to dc to [facility] care when medically stable.   .Clinical social worker continuing to follow pt to assist with pt dc plans and further csw needs.   [name], [name]  [phone] .12/10/2011 9:27am

## 2011-12-10 NOTE — Progress Notes (Signed)
I have seen and examined the patient and agree with the assessment and plans. No current gen surg issues.  Will see prn over weekend  Shakema Surita A. Magnus Ivan  MD, FACS

## 2011-12-10 NOTE — Progress Notes (Signed)
Subjective:  Patient developed cough associated with shortness of breath and wheezing earlier this morning was started on IV Lasix and broad-spectrum antibiotics empirically this x-ray showed congestive heart failure and possible bilateral pneumonia. Patient states feels better after diuresis appetite remains poor patient had bowel movement yesterday  Objective:  Vital Signs in the last 24 hours: Temp:  [98 F (36.7 C)-98.7 F (37.1 C)] 98.7 F (37.1 C) (07/12 1600) Pulse Rate:  [50-62] 53  (07/12 1600) Resp:  [16-18] 16  (07/12 1600) BP: (98-137)/(60-79) 101/65 mmHg (07/12 1600) SpO2:  [95 %-100 %] 100 % (07/12 1600) Weight:  [50.2 kg (110 lb 10.7 oz)] 50.2 kg (110 lb 10.7 oz) (07/12 0500)  Intake/Output from previous day: 07/11 0701 - 07/12 0700 In: 126 [P.O.:120; I.V.:6] Out: -  Intake/Output from this shift: Total I/O In: 120 [P.O.:120] Out: -   Physical Exam: Neck: no adenopathy, no carotid bruit, no JVD and supple, symmetrical, trachea midline Lungs: Bilateral rhonchi Rales noted Heart: regular rate and rhythm, S1, S2 normal and Soft systolic murmur and S3 gallop noted Abdomen: soft, non-tender; bowel sounds normal; no masses,  no organomegaly Extremities: extremities normal, atraumatic, no cyanosis or edema  Lab Results:  [facility] 12/09/11 0620  WBC 3.8*  HGB 8.8*  PLT 175    Basename 12/09/11 0620  NA 137  K 4.0  CL 104  CO2 26  GLUCOSE 86  BUN 12  CREATININE 0.57   No results found for this basename: TROPONINI:2,CK,MB:2 in the last 72 hours Hepatic Function Panel No results found for this basename: PROT,ALBUMIN,AST,ALT,ALKPHOS,BILITOT,BILIDIR,IBILI in the last 72 hours No results found for this basename: CHOL in the last 72 hours No results found for this basename: PROTIME in the last 72 hours  Imaging: Imaging results have been reviewed and Dg Chest 2 View  12/10/2011  *RADIOLOGY REPORT*  Clinical Data: 78 year old female with shortness of breath and  weakness.  CHEST - 2 VIEW  Comparison: 11/25/2011 and earlier.  Findings: AP and lateral views of the chest.  Cardiac event recorder again noted.  Lower lung volumes and increased interstitial and basilar patchy opacity.  Stable cardiomegaly and mediastinal contours.  Osteopenia.  IMPRESSION: Worsening pulmonary disease since 11/25/2011 in the form of continued interstitial opacity and now more confluent basilar airspace disease.  Appearance may reflect worsening pulmonary edema with atelectasis or bilateral pneumonia.  Original Report Authenticated By: Harley Hallmark, M.D.    Cardiac Studies:  Assessment/Plan:  Acute diastolic heart failure Possible bilateral aspiration pneumonia Status post exploratory laparotomy/obturator hernia repair was small bowel obstruction Status post possible small non-Q-wave myocardial infarction Hypertension History of syncope in the past Dementia History as a general seizure disorder Chronic anemia Malnutrition Status post postop ileus Plan Blood cultures x2 Start Vanco and Zosyn per pharmacy Lasix IV 20 mg daily Check labs in a.m.  LOS: 16 days    Martine Trageser N 12/10/2011, 5:26 PM

## 2011-12-10 NOTE — Progress Notes (Signed)
Pt with expiratory wheezes with prolonged expiratory phase SAO2 96% on 2L N/C, encouraged cough and deep breathing exercises. Pt. Coughing up sm. Amt. Of pink tinged frothy sputum.

## 2011-12-10 NOTE — Progress Notes (Signed)
Pt. Resting quietly with no wheezing and NAD

## 2011-12-10 NOTE — Progress Notes (Signed)
Patient ID: [name], female   DOB: 1929/07/04, 78 y.o.   MRN: [id number] Patient ID: [name], female   DOB: 10-02-1929, 78 y.o.   MRN: [id number] Patient ID: [name], female   DOB: 30-Apr-1930, 78 y.o.   MRN: [id number] Patient ID: [name], female   DOB: 1929-07-08, 78 y.o.   MRN: [id number] 10 Days Post-Op   Subjective: Generally doing well this am, but now presents with coarse rhonchi and exp wheeze,which started in the early morning hours. Sats on 3 lpm Wilder 97%. No report of aspiration or chest pain. Denies calf pain. Negative Homan's sign. It is reported that she has been incontinent of her urine so output is not measured.  Objective: Vital signs in last 24 hours: Temp:  [97.7 F (36.5 C)-98.8 F (37.1 C)] 98.4 F (36.9 C) (07/12 0500) Pulse Rate:  [48-60] 60  (07/12 0500) Resp:  [16-18] 18  (07/12 0500) BP: (95-126)/(56-72) 126/72 mmHg (07/12 0500) SpO2:  [92 %-100 %] 100 % (07/12 0500) Weight:  [110 lb 10.7 oz (50.2 kg)] 110 lb 10.7 oz (50.2 kg) (07/12 0500) Last BM Date: 12/09/11  Intake/Output from previous day: 07/11 0701 - 07/12 0700 In: 126 [P.O.:120; I.V.:6] Out: -  Intake/Output this shift:    PE: Abdomen soft, minimally tender, non distended, incision clean, staples intact, no erythema. +BS, Flatus, 2 BM to date.. Chest: as above noted. Extremities: No edema, negative Homan's.  Lab Results:   [facility] 12/09/11 0620  WBC 3.8*  HGB 8.8*  HCT 27.9*  PLT 175   BMET  Basename 12/09/11 0620  NA 137  K 4.0  CL 104  CO2 26  GLUCOSE 86  BUN 12  CREATININE 0.57  CALCIUM 8.0*   PT/INR No results found for this basename: LABPROT:2,INR:2 in the last 72 hours Comprehensive Metabolic Panel:    Component Value Date/Time   NA 137 12/09/2011 0620   K 4.0 12/09/2011 0620   CL 104 12/09/2011 0620   CO2 26 12/09/2011 0620   BUN 12 12/09/2011 0620   CREATININE 0.57 12/09/2011 0620   GLUCOSE 86 12/09/2011 0620   CALCIUM 8.0* 12/09/2011 0620   AST 22 12/06/2011 0625   ALT 11 12/06/2011 0625   ALKPHOS 47 12/06/2011 0625   BILITOT 0.3 12/06/2011 0625   PROT 6.1 12/06/2011 0625   ALBUMIN 2.0* 12/06/2011 0625     Studies/Results: No results found.  Anti-infectives: Anti-infectives     Start     Dose/Rate Route Frequency Ordered Stop   11/26/11 1000   azithromycin (ZITHROMAX) tablet 250 mg        250 mg Oral Daily 11/25/11 1122 11/30/11 0959   11/25/11 2000   cefTRIAXone (ROCEPHIN) 1 g in dextrose 5 % 50 mL IVPB  Status:  Discontinued        1 g 100 mL/hr over 30 Minutes Intravenous Every 24 hours 11/25/11 1817 12/08/11 0919   11/25/11 1130   azithromycin (ZITHROMAX) tablet 500 mg        500 mg Oral Daily 11/25/11 1122 11/25/11 1150          Assessment Principal Problem:  *Obturator hernia with obstruction Active Problems:  DEMENTIA  HYPERTENSION  SYNCOPE  SEIZURE DISORDER  Implantable loop recorder  Hypoxia  CAP (community acquired pneumonia)  Elevated troponin s/p exp lap   LOS: 16 days   Plan:   1. Medical management per medicine, ? Need for lasix 2. Will get Chest X-ray 3. Begin Jet  Nebulizer treatment for pulmonary toilet. 4. Advance diet   Lazarus Sudbury 12/10/2011

## 2011-12-11 LAB — CBC
HCT: 33 % — ABNORMAL LOW (ref 36.0–46.0)
Hemoglobin: 10.4 g/dL — ABNORMAL LOW (ref 12.0–15.0)
MCHC: 31.5 g/dL (ref 30.0–36.0)
MCV: 65.1 fL — ABNORMAL LOW (ref 78.0–100.0)
RDW: 17.7 % — ABNORMAL HIGH (ref 11.5–15.5)

## 2011-12-11 LAB — BASIC METABOLIC PANEL
BUN: 13 mg/dL (ref 6–23)
CO2: 29 mEq/L (ref 19–32)
Chloride: 101 mEq/L (ref 96–112)
Creatinine, Ser: 0.67 mg/dL (ref 0.50–1.10)
Potassium: 4.7 mEq/L (ref 3.5–5.1)

## 2011-12-11 NOTE — Progress Notes (Signed)
Occupational Therapy Treatment and goal update Patient Details Name: [name] MRN: [id number] DOB: 03-29-1930 Today's Date: 12/11/2011 Time: 2952-8413 OT Time Calculation (min): 34 min  OT Assessment / Plan / Recommendation Comments on Treatment Session      Follow Up Recommendations  Skilled nursing facility;Supervision/Assistance - 24 hour    Barriers to Discharge       Equipment Recommendations  Defer to next venue    Recommendations for Other Services    Frequency Min 2X/week   Plan Discharge plan remains appropriate    Precautions / Restrictions Precautions Precautions: Fall Restrictions Weight Bearing Restrictions: No   Pertinent Vitals/Pain No sign/complaints of pain    ADL  Grooming: Teeth care;Set up;Performed (swab) Where Assessed - Grooming: Unsupported sitting Upper Body Bathing: Performed;Supervision/safety (min cues) Where Assessed - Upper Body Bathing: Unsupported sitting Lower Body Bathing: Performed;Maximal assistance Where Assessed - Lower Body Bathing: Supported sit to stand Upper Body Dressing: Performed;Minimal assistance (lines) Where Assessed - Upper Body Dressing: Supported sitting Toilet Transfer: Performed;Minimal assistance Statistician Method: Surveyor, minerals: Materials engineer and Hygiene: Performed;Maximal assistance Where Assessed - Engineer, mining and Hygiene: Standing ADL Comments: Pt was incontinent when I arrived:  assisted to 3:1 and to clean up.      OT Diagnosis:    OT Problem List:   OT Treatment Interventions:     OT Goals Acute Rehab OT Goals Time For Goal Achievement: 12/25/11 ADL Goals Pt Will Perform Grooming: Standing at sink;with set-up;Other (comment) (for one task) ADL Goal: Grooming - Progress: Revised due to lack of progress Pt Will Perform Upper Body Bathing: Sitting at sink;with set-up ADL Goal: Upper Body Bathing - Progress: Other  (comment) (continue goal) Pt Will Perform Lower Body Bathing: Sit to stand from chair;with min assist ADL Goal: Lower Body Bathing - Progress: Revised due to lack of progress Pt Will Perform Upper Body Dressing: with set-up;Sitting, bed;Sitting, chair ADL Goal: Upper Body Dressing - Progress: Discontinued (comment) (pt has iv lines, requires assistance) Pt Will Transfer to Toilet: 3-in-1;with supervision;Stand pivot transfer ADL Goal: Toilet Transfer - Progress: Revised due to lack of progress Pt Will Perform Toileting - Clothing Manipulation: Standing;with min assist ADL Goal: Toileting - Clothing Manipulation - Progress: Revised due to lack of progress Pt Will Perform Toileting - Hygiene: with min assist;Sit to stand from 3-in-1/toilet ADL Goal: Toileting - Hygiene - Progress: Revised due to lack of progress  Visit Information  Last OT Received On: 12/11/11 Assistance Needed: +1    Subjective Data      Prior Functioning       Cognition  Overall Cognitive Status: Impaired Area of Impairment: Attention Arousal/Alertness: Awake/alert Orientation Level: Appears intact for tasks assessed Behavior During Session: [facility] for tasks performed Current Attention Level: Selective    Mobility Bed Mobility Rolling Right: 4: Min guard;With rail Right Sidelying to Sit: 4: Min assist Transfers Sit to Stand: 4: Min assist;With upper extremity assist;From bed   Exercises    Balance    End of Session OT - End of Session Activity Tolerance: Patient limited by fatigue Patient left: in bed;with call bell/phone within reach;with bed alarm set  GO     [name] 12/11/2011, 3:39 PM Marica Otter, OTR/L [phone] 12/11/2011

## 2011-12-11 NOTE — Progress Notes (Signed)
CRITICAL VALUE ALERT  Critical value received:  Positive Blood Culture: Gram+ Cocci in Clusters    Date of notification:  12/11/2011    Time of notification:  17:15   Critical value read back:yes  Nurse who received alert:  Candace Cruise, RN  MD notified (1st page):  Dr. Sharyn Lull  Time of first page:  17:35  MD notified (2nd page):  Time of second page:  Responding MD: Dr. Sharyn Lull    Time MD responded:  17:41  Dr. Sharyn Lull stated that pt is already on IV Vancomycin (for PNA) and this medicine should also cover the bacteria found in the blood culture.

## 2011-12-11 NOTE — Progress Notes (Signed)
Subjective:  Patient denies any chest pain states breathing has improved  Objective:  Vital Signs in the last 24 hours: Temp:  [97.4 F (36.3 C)-98.7 F (37.1 C)] 97.4 F (36.3 C) (07/13 0800) Pulse Rate:  [51-63] 63  (07/13 0800) Resp:  [15-16] 16  (07/13 0800) BP: (95-117)/(52-76) 105/52 mmHg (07/13 0800) SpO2:  [97 %-100 %] 99 % (07/13 0800)  Intake/Output from previous day: 07/12 0701 - 07/13 0700 In: 120 [P.O.:120] Out: -  Intake/Output from this shift:    Physical Exam: Neck: no adenopathy, no carotid bruit, no JVD and supple, symmetrical, trachea midline Lungs: Bilateral rhonchi and rales Heart: regular rate and rhythm, S1, S2 normal and Soft systolic murmur noted and S3 gallop noted Abdomen: soft, non-tender; bowel sounds normal; no masses,  no organomegaly Extremities: extremities normal, atraumatic, no cyanosis or edema  Lab Results:  Basename 12/11/11 0645 12/09/11 0620  WBC 4.2 3.8*  HGB 10.4* 8.8*  PLT 227 175    Basename 12/11/11 0645 12/09/11 0620  NA 137 137  K 4.7 4.0  CL 101 104  CO2 29 26  GLUCOSE 76 86  BUN 13 12  CREATININE 0.67 0.57   No results found for this basename: TROPONINI:2,CK,MB:2 in the last 72 hours Hepatic Function Panel No results found for this basename: PROT,ALBUMIN,AST,ALT,ALKPHOS,BILITOT,BILIDIR,IBILI in the last 72 hours No results found for this basename: CHOL in the last 72 hours No results found for this basename: PROTIME in the last 72 hours  Imaging: Imaging results have been reviewed and Dg Chest 2 View  12/10/2011  *RADIOLOGY REPORT*  Clinical Data: 78 year old female with shortness of breath and weakness.  CHEST - 2 VIEW  Comparison: 11/25/2011 and earlier.  Findings: AP and lateral views of the chest.  Cardiac event recorder again noted.  Lower lung volumes and increased interstitial and basilar patchy opacity.  Stable cardiomegaly and mediastinal contours.  Osteopenia.  IMPRESSION: Worsening pulmonary disease since  11/25/2011 in the form of continued interstitial opacity and now more confluent basilar airspace disease.  Appearance may reflect worsening pulmonary edema with atelectasis or bilateral pneumonia.  Original Report Authenticated By: Harley Hallmark, M.D.    Cardiac Studies:  Assessment/Plan:  Resolving Acute diastolic heart failure  Possible bilateral aspiration pneumonia  Status post exploratory laparotomy/obturator hernia repair was small bowel obstruction  Status post possible small non-Q-wave myocardial infarction  Hypertension  History of syncope in the past  Dementia  History as a general seizure disorder  Chronic anemia  Malnutrition  Status post postop ileus  Plan Continue present management Increase ambulation Out of bed to chair  LOS: 17 days    [name] 12/11/2011, 9:53 AM

## 2011-12-12 LAB — CULTURE, BLOOD (ROUTINE X 2)

## 2011-12-12 NOTE — Progress Notes (Signed)
Subjective:  Patient denies any chest pain or shortness of breath. Abdominal pain markedly improved states had BM this morning. Her blood cultures one bottle grew gram-positive cocci in clusters final results still pending Objective:  Vital Signs in the last 24 hours: Temp:  [97.4 F (36.3 C)-98.4 F (36.9 C)] 98.4 F (36.9 C) (07/14 0500) Pulse Rate:  [55-102] 60  (07/14 0951) Resp:  [16-18] 16  (07/14 0500) BP: (93-104)/(54-63) 95/56 mmHg (07/14 0951) SpO2:  [97 %-100 %] 97 % (07/14 0825) Weight:  [45.4 kg (100 lb 1.4 oz)] 45.4 kg (100 lb 1.4 oz) (07/14 0500)  Intake/Output from previous day: 07/13 0701 - 07/14 0700 In: 340 [P.O.:20; I.V.:120; IV Piggyback:200] Out: -  Intake/Output from this shift: Total I/O In: 120 [P.O.:120] Out: -   Physical Exam: Neck: no adenopathy, no carotid bruit, no JVD and supple, symmetrical, trachea midline Lungs: Bilateral rhonchi and rales noted air entry has improved Heart: regular rate and rhythm, S1, S2 normal and Soft systolic murmur and S3 gallop noted Abdomen: soft, non-tender; bowel sounds normal; no masses,  no organomegaly Extremities: extremities normal, atraumatic, no cyanosis or edema  Lab Results:  Basename 12/11/11 0645  WBC 4.2  HGB 10.4*  PLT 227    Basename 12/11/11 0645  NA 137  K 4.7  CL 101  CO2 29  GLUCOSE 76  BUN 13  CREATININE 0.67   No results found for this basename: TROPONINI:2,CK,MB:2 in the last 72 hours Hepatic Function Panel No results found for this basename: PROT,ALBUMIN,AST,ALT,ALKPHOS,BILITOT,BILIDIR,IBILI in the last 72 hours No results found for this basename: CHOL in the last 72 hours No results found for this basename: PROTIME in the last 72 hours  Imaging: Imaging results have been reviewed and No results found.  Cardiac Studies:  Assessment/Plan: Gram-positive cocci bacteremia probably skin contaminant Resolving Acute diastolic heart failure  Possible bilateral aspiration pneumonia    Status post exploratory laparotomy/obturator hernia repair was small bowel obstruction  Status post possible small non-Q-wave myocardial infarction  Hypertension  History of syncope in the past  Dementia  History as a general seizure disorder  Chronic anemia  Malnutrition  Status post postop ileus  Plan Continue vancomycin and Zosyn for now Check final cultures  LOS: 18 days    Andriy Sherk N 12/12/2011, 10:25 AM

## 2011-12-12 NOTE — Progress Notes (Signed)
Pt coughed up dime size amount of blood tinged sputum during the night.  Has not had any during the day. Dr. Sharyn Lull notified and stated that it was okay to give pt 40mg  Lovenox today and he could probably change the dose to 30mg  starting tomorrow.  Advised pt to notify RN if any more pink/red sputum was coughed up.  Pt compliant.  Will continue to monitor pt.

## 2011-12-13 LAB — VANCOMYCIN, TROUGH: Vancomycin Tr: 8.9 ug/mL — ABNORMAL LOW (ref 10.0–20.0)

## 2011-12-13 MED ORDER — ENOXAPARIN SODIUM 30 MG/0.3ML ~~LOC~~ SOLN
30.0000 mg | SUBCUTANEOUS | Status: DC
  Administered 2011-12-14 – 2011-12-15 (×2): 30 mg via SUBCUTANEOUS
  Filled 2011-12-13 (×2): qty 0.3

## 2011-12-13 MED ORDER — VANCOMYCIN HCL 500 MG IV SOLR
500.0000 mg | Freq: Two times a day (BID) | INTRAVENOUS | Status: DC
  Administered 2011-12-13 – 2011-12-14 (×2): 500 mg via INTRAVENOUS
  Filled 2011-12-13 (×3): qty 500

## 2011-12-13 MED ORDER — RAMIPRIL 2.5 MG PO CAPS
2.5000 mg | ORAL_CAPSULE | Freq: Every day | ORAL | Status: DC
  Administered 2011-12-14 – 2011-12-15 (×2): 2.5 mg via ORAL
  Filled 2011-12-13 (×2): qty 1

## 2011-12-13 NOTE — Progress Notes (Signed)
Please call back if needed

## 2011-12-13 NOTE — Progress Notes (Signed)
.  Clinical social worker continuing to follow pt to assist with pt dc plans and further csw needs. Pt plans to dc to [facility] when medically stable.   [name], [name]  [phone] .12/13/2011 1543pm

## 2011-12-13 NOTE — Progress Notes (Signed)
Nutrition Follow-up  Intervention:    Continue Ensure Complete supplement twice daily (350 kcals, 13 gm protein per 8 fl oz bottle) RD to follow for nutrition care plan  Assessment:   Patient states her appetite is "pretty good". PO intake per flowsheet records at 10-40%. Drinking Ensure Complete supplements. + BM 7/11. Abdominal pain improved.  Diet Order:  Heart Healthy  Meds: Scheduled Meds:   . albuterol  2.5 mg Nebulization QID  . antiseptic oral rinse  15 mL Mouth Rinse BID  . aspirin EC  81 mg Oral Daily  . donepezil  5 mg Oral Daily  . enoxaparin (LOVENOX) injection  40 mg Subcutaneous Daily  . feeding supplement  237 mL Oral BID BM  . furosemide  20 mg Intravenous Daily  . metoprolol tartrate  12.5 mg Oral BID  . pantoprazole  40 mg Oral Q1200  . piperacillin-tazobactam (ZOSYN)  IV  3.375 g Intravenous Q8H  . potassium chloride  20 mEq Oral Daily  . ramipril  5 mg Oral Daily  . simvastatin  20 mg Oral q1800  . sodium chloride  3 mL Intravenous Q12H  . sucralfate  1 g Oral TID WC & HS  . vancomycin  750 mg Intravenous Q24H   Continuous Infusions:  PRN Meds:.sodium chloride, acetaminophen, alum & mag hydroxide-simeth, guaiFENesin-dextromethorphan, magnesium hydroxide, nitroGLYCERIN, ondansetron (ZOFRAN) IV, sodium chloride  Labs:  CMP     Component Value Date/Time   NA 137 12/11/2011 0645   K 4.7 12/11/2011 0645   CL 101 12/11/2011 0645   CO2 29 12/11/2011 0645   GLUCOSE 76 12/11/2011 0645   BUN 13 12/11/2011 0645   CREATININE 0.67 12/11/2011 0645   CALCIUM 8.6 12/11/2011 0645   PROT 6.1 12/06/2011 0625   ALBUMIN 2.0* 12/06/2011 0625   AST 22 12/06/2011 0625   ALT 11 12/06/2011 0625   ALKPHOS 47 12/06/2011 0625   BILITOT 0.3 12/06/2011 0625   GFRNONAA 81* 12/11/2011 0645   GFRAA >90 12/11/2011 0645     Intake/Output Summary (Last 24 hours) at 12/13/11 1120 Last data filed at 12/13/11 0800  Gross per 24 hour  Intake   1010 ml  Output      2 ml  Net   1008 ml    Weight  Status:  44.5 kg (7/15) -- fluctuating  Re-estimated needs:  1100-1200 kcals, 45-55 gm protein  Nutrition Dx:  Inadequate Oral Intake r/t poor appetite, altered GI function as evidenced by PO intake 10-40%, ongoing  Goal:  Oral intake with meals & supplements to meet >/= 90% of estimated nutrition needs, progressing  Monitor:  PO intake, weight, labs, I/O's   Alger Memos, RD, LDN Pager #: [phone] After-Hours Pager #: [phone]

## 2011-12-13 NOTE — Progress Notes (Signed)
Physical Therapy Treatment Patient Details Name: [name] MRN: [id number] DOB: Jan 29, 1930 Today's Date: 12/13/2011 Time: 5621-3086 PT Time Calculation (min): 26 min  PT Assessment / Plan / Recommendation Comments on Treatment Session  Patient continues to show improvements in functional mobility and activity tolerance.     Follow Up Recommendations  Skilled nursing facility    Barriers to Discharge  None      Equipment Recommendations  Defer to next venue    Recommendations for Other Services  None  Frequency Min 3X/week   Plan Discharge plan remains appropriate;Frequency remains appropriate    Precautions / Restrictions Precautions Precautions: Fall   Pertinent Vitals/Pain VSS/ No pain    Mobility  Bed Mobility Rolling Right: 5: Supervision Right Sidelying to Sit: HOB flat;4: Min guard Details for Bed Mobility Assistance: Improved initiation of bed mobility today. Verbal cues fro sequencing.  Transfers Sit to Stand: 4: Min assist;With upper extremity assist;From bed Stand to Sit: With upper extremity assist;4: Min guard;To chair/3-in-1 Details for Transfer Assistance: Questioning cues for correct hand placement to and from device and then patient able to recall correct technique. Stood multiple times secondary to patient with loose BM and required cleaning and change of clothes. With increased practice patient close to performing without physical assistance.  Ambulation/Gait Ambulation/Gait Assistance: 4: Min guard Ambulation Distance (Feet): 45 Feet Assistive device: Rolling walker Ambulation/Gait Assistance Details: Improved posture, stride lingth, and speed of gait. No physical assistance provided today just verbal cues for safe use of device as occasionally walks too far from it.  Gait Pattern: Trunk flexed;Step-through pattern     PT Goals Acute Rehab PT Goals Time For Goal Achievement: 12/20/11 Pt will go Supine/Side to Sit: with modified independence PT  Goal: Supine/Side to Sit - Progress: Goal set today Pt will go Sit to Supine/Side: with modified independence PT Goal: Sit to Supine/Side - Progress: Goal set today Pt will go Sit to Stand: with supervision;with upper extremity assist PT Goal: Sit to Stand - Progress: Goal set today Pt will go Stand to Sit: with supervision;with upper extremity assist PT Goal: Stand to Sit - Progress: Goal set today Pt will Ambulate: 51 - 150 feet;with supervision;with least restrictive assistive device PT Goal: Ambulate - Progress: Goal set today PT Goal: Up/Down Stairs - Progress: Discontinued (comment)  Visit Information  Last PT Received On: 12/13/11 Assistance Needed: +1    Subjective Data  Subjective: Patient reports no pain at present Patient Stated Goal: Walk   Cognition  Arousal/Alertness: Awake/alert Orientation Level: Appears intact for tasks assessed Current Attention Level: Selective Following Commands: Follows multi-step commands inconsistently    Pension scheme manager Standing - Balance Support: Bilateral upper extremity supported Static Standing - Level of Assistance: 5: Stand by assistance  End of Session PT - End of Session Equipment Utilized During Treatment: Gait belt Activity Tolerance: Patient tolerated treatment well Patient left: in chair;with call bell/phone within reach Nurse Communication: Mobility status    Edwyna Perfect, PT  Pager [phone]  12/13/2011, 8:57 AM

## 2011-12-13 NOTE — Progress Notes (Signed)
Subjective:  Patient denies any chest pain states cough and breathing is improved  Objective:  Vital Signs in the last 24 hours: Temp:  [98.2 F (36.8 C)-98.6 F (37 C)] 98.6 F (37 C) (07/15 0500) Pulse Rate:  [55-64] 64  (07/15 1006) Resp:  [16-18] 16  (07/15 0500) BP: (95-105)/(56-67) 98/58 mmHg (07/15 1006) SpO2:  [96 %-100 %] 100 % (07/15 0901) Weight:  [44.5 kg (98 lb 1.7 oz)] 44.5 kg (98 lb 1.7 oz) (07/15 0500)  Intake/Output from previous day: 07/14 0701 - 07/15 0700 In: 1042 [P.O.:720; I.V.:120; IV Piggyback:202] Out: 2 [Stool:2] Intake/Output from this shift: Total I/O In: 240 [P.O.:240] Out: -   Physical Exam: Neck: no adenopathy, no carotid bruit, no JVD and supple, symmetrical, trachea midline Lungs: Bilateral rhonchi and rales noted Heart: regular rate and rhythm, S1, S2 normal and Soft systolic murmur and S3 gallop noted Abdomen: soft, non-tender; bowel sounds normal; no masses,  no organomegaly Extremities: extremities normal, atraumatic, no cyanosis or edema  Lab Results:  Basename 12/11/11 0645  WBC 4.2  HGB 10.4*  PLT 227    Basename 12/11/11 0645  NA 137  K 4.7  CL 101  CO2 29  GLUCOSE 76  BUN 13  CREATININE 0.67   No results found for this basename: TROPONINI:2,CK,MB:2 in the last 72 hours Hepatic Function Panel No results found for this basename: PROT,ALBUMIN,AST,ALT,ALKPHOS,BILITOT,BILIDIR,IBILI in the last 72 hours No results found for this basename: CHOL in the last 72 hours No results found for this basename: PROTIME in the last 72 hours  Imaging: Imaging results have been reviewed and No results found.  Cardiac Studies:  Assessment/Plan:  Gram-positive cocci bacteremia probably skin contaminant  Resolving Acute diastolic heart failure  Possible bilateral aspiration pneumonia  Status post exploratory laparotomy/obturator hernia repair was small bowel obstruction  Status post possible small non-Q-wave myocardial infarction    Hypertension  History of syncope in the past  Dementia  History as a general seizure disorder  Chronic anemia  Malnutrition  Status post postop ileus  Plan Continue present management Check labs in a.m.   LOS: 19 days    Gyneth Hubka N 12/13/2011, 11:32 AM

## 2011-12-13 NOTE — Progress Notes (Addendum)
ANTIBIOTIC CONSULT NOTE - FOLLOW UP  Pharmacy Consult for vanc/zosyn/enoxaparin Indication: rule out pneumonia  Allergies  Allergen Reactions  . Codeine     Patient Measurements: Height: 4\' 11"  (149.9 cm) Weight: 98 lb 1.7 oz (44.5 kg) IBW/kg (Calculated) : 43.2  Adjusted Body Weight: 50 kg  Vital Signs: Temp: 98.6 F (37 C) (07/15 0500) BP: 98/58 mmHg (07/15 1006) Pulse Rate: 64  (07/15 1006) Intake/Output from previous day: 07/14 0701 - 07/15 0700 In: 1042 [P.O.:720; I.V.:120; IV Piggyback:202] Out: 2 [Stool:2] Intake/Output from this shift: Total I/O In: 240 [P.O.:240] Out: -   Labs:  Basename 12/11/11 0645  WBC 4.2  HGB 10.4*  PLT 227  LABCREA --  CREATININE 0.67   Estimated Creatinine Clearance: 38.9 ml/min (by C-G formula based on Cr of 0.67).  Basename 12/13/11 1006  VANCOTROUGH 8.9*  VANCOPEAK --  VANCORANDOM --  GENTTROUGH --  GENTPEAK --  GENTRANDOM --  TOBRATROUGH --  TOBRAPEAK --  TOBRARND --  AMIKACINPEAK --  AMIKACINTROU --  AMIKACIN --     Microbiology: Recent Results (from the past 720 hour(s))  SURGICAL PCR SCREEN     Status: Abnormal   Collection Time   11/30/11 10:48 PM      Component Value Range Status Comment   MRSA, PCR NEGATIVE  NEGATIVE Final    Staphylococcus aureus POSITIVE (*) NEGATIVE Final   CULTURE, BLOOD (ROUTINE X 2)     Status: Normal (Preliminary result)   Collection Time   12/10/11  1:00 PM      Component Value Range Status Comment   Specimen Description BLOOD ARM RIGHT   Final    Special Requests BOTTLES DRAWN AEROBIC AND ANAEROBIC 5.0CC EACH   Final    Culture  Setup Time 12/10/2011 20:07   Final    Culture     Final    Value:        BLOOD CULTURE RECEIVED NO GROWTH TO DATE CULTURE WILL BE HELD FOR 5 DAYS BEFORE ISSUING A FINAL NEGATIVE REPORT   Report Status PENDING   Incomplete   CULTURE, BLOOD (ROUTINE X 2)     Status: Normal   Collection Time   12/10/11  1:10 PM      Component Value Range Status Comment     Specimen Description BLOOD ARM RIGHT   Final    Special Requests BOTTLES DRAWN AEROBIC ONLY 3.0CC   Final    Culture  Setup Time 12/10/2011 20:08   Final    Culture     Final    Value: STAPHYLOCOCCUS SPECIES (COAGULASE NEGATIVE)     Note: THE SIGNIFICANCE OF ISOLATING THIS ORGANISM FROM A SINGLE SET OF BLOOD CULTURES WHEN MULTIPLE SETS ARE DRAWN IS UNCERTAIN. PLEASE NOTIFY THE MICROBIOLOGY DEPARTMENT WITHIN ONE WEEK IF SPECIATION AND SENSITIVITIES ARE REQUIRED.     Note: Gram Stain Report Called to,Read Back By and Verified With: RN EDomenick Bookbinder ON 12/11/11 AT 1719 BY DTERRY   Report Status 12/12/2011 FINAL   Final     Anti-infectives     Start     Dose/Rate Route Frequency Ordered Stop   12/13/11 2200   vancomycin (VANCOCIN) 500 mg in sodium chloride 0.9 % 100 mL IVPB        500 mg 100 mL/hr over 60 Minutes Intravenous Every 12 hours 12/13/11 1210     12/10/11 1100   vancomycin (VANCOCIN) 750 mg in sodium chloride 0.9 % 150 mL IVPB        750  mg 150 mL/hr over 60 Minutes Intravenous Every 24 hours 12/10/11 1006 12/13/11 1400   12/10/11 1100  piperacillin-tazobactam (ZOSYN) IVPB 3.375 g       3.375 g 12.5 mL/hr over 240 Minutes Intravenous Every 8 hours 12/10/11 1006     11/26/11 1000   azithromycin (ZITHROMAX) tablet 250 mg        250 mg Oral Daily 11/25/11 1122 11/30/11 0959   11/25/11 2000   cefTRIAXone (ROCEPHIN) 1 g in dextrose 5 % 50 mL IVPB  Status:  Discontinued        1 g 100 mL/hr over 30 Minutes Intravenous Every 24 hours 11/25/11 1817 12/08/11 0919   11/25/11 1130   azithromycin (ZITHROMAX) tablet 500 mg        500 mg Oral Daily 11/25/11 1122 11/25/11 1150          Assessment: 78 yo female admitted 11/25/11 with complaints of SOB and left sided discomfort. S/P ex lap for bowel obstruction on 12/01/11. S/p ceftriaxone x 14 days. Pt afebrile, WBC 4.2 on 7/13 - new CBC ordered for tomorrow AM. Vancomycin trough this AM SUB-therapeutic at 8.9 mg/dl. Renal fnx stable as  of 7/13.  BCx (7/12) >> 1/2 coag neg staph CXR 7/12 showed ? Bilateral pna  Goal of Therapy:  Vancomycin trough level 15-20 mcg/ml  Plan:  1. Continue Zosyn 3.375G IV q8h to be infused over 4 ours 2. Change vancomycin to 500 mg IV q12 to be started at 2200 tonight 3. Measure antibiotic drug levels at steady state 4. F/u clinical course, fever curve, CXR, renal fnx  Laurence Slate 12/13/2011,12:13 PM  Addendum:  Pharmacy consulted to dose lovenox for VTE ppx. Pt previously on 40mg  [place] daily. Pts weight is 44.5kg, CrCl ~56ml/min and stable. Will decrease lovenox dose to 30mg  [place] daily.  Tomi Bamberger, PharmD Clinical Pharmacist Pager: [phone] Pharmacy: [phone] 12/13/2011 12:23 PM

## 2011-12-13 NOTE — Progress Notes (Signed)
Utilization review completed.  

## 2011-12-13 NOTE — Progress Notes (Signed)
Patient ID: ANDORA KRULL, female   DOB: 1929/11/27, 78 y.o.   MRN: [id number] Patient ID: MERCEDES FORT, female   DOB: 1929-09-09, 78 y.o.   MRN: [id number] Patient ID: MALORY SPURR, female   DOB: Jul 17, 1929, 78 y.o.   MRN: [id number] Patient ID: KIRSTON LUTY, female   DOB: 02-21-1930, 78 y.o.   MRN: [id number] Patient ID: RONDA RAJKUMAR, female   DOB: 07-19-1929, 78 y.o.   MRN: [id number] 13 Days Post-Op   Subjective: Generally doing well this am, On room air, much improved since last Friday.  Chest CTA   Objective: Vital signs in last 24 hours: Temp:  [98.2 F (36.8 C)-98.6 F (37 C)] 98.6 F (37 C) (07/15 0500) Pulse Rate:  [55-60] 55  (07/15 0500) Resp:  [16-18] 16  (07/15 0500) BP: (95-105)/(56-67) 105/67 mmHg (07/15 0500) SpO2:  [96 %-100 %] 100 % (07/15 0901) Weight:  [98 lb 1.7 oz (44.5 kg)] 98 lb 1.7 oz (44.5 kg) (07/15 0500) Last BM Date: 12/13/11  Intake/Output from previous day: 07/14 0701 - 07/15 0700 In: 1042 [P.O.:720; I.V.:120; IV Piggyback:202] Out: 2 [Stool:2] Intake/Output this shift: Total I/O In: 240 [P.O.:240] Out: -   PE: Abdomen soft, non tender, non distended, incision clean, well healed  staples removed, no erythema. +BS, Flatus, + BM. Chest:CTAonm room air Extremities: No edema.  Lab Results:   [facility] 12/11/11 0645  WBC 4.2  HGB 10.4*  HCT 33.0*  PLT 227   BMET  Basename 12/11/11 0645  NA 137  K 4.7  CL 101  CO2 29  GLUCOSE 76  BUN 13  CREATININE 0.67  CALCIUM 8.6   PT/INR No results found for this basename: LABPROT:2,INR:2 in the last 72 hours Comprehensive Metabolic Panel:    Component Value Date/Time   NA 137 12/11/2011 0645   K 4.7 12/11/2011 0645   CL 101 12/11/2011 0645   CO2 29 12/11/2011 0645   BUN 13 12/11/2011 0645   CREATININE 0.67 12/11/2011 0645   GLUCOSE 76 12/11/2011 0645   CALCIUM 8.6 12/11/2011 0645   AST 22 12/06/2011 0625   ALT 11 12/06/2011 0625   ALKPHOS 47 12/06/2011 0625   BILITOT 0.3 12/06/2011 0625   PROT 6.1 12/06/2011 0625   ALBUMIN 2.0* 12/06/2011 0625     Studies/Results: No results found.  Anti-infectives: Anti-infectives     Start     Dose/Rate Route Frequency Ordered Stop   12/10/11 1100   vancomycin (VANCOCIN) 750 mg in sodium chloride 0.9 % 150 mL IVPB        750 mg 150 mL/hr over 60 Minutes Intravenous Every 24 hours 12/10/11 1006     12/10/11 1100  piperacillin-tazobactam (ZOSYN) IVPB 3.375 g       3.375 g 12.5 mL/hr over 240 Minutes Intravenous Every 8 hours 12/10/11 1006     11/26/11 1000   azithromycin (ZITHROMAX) tablet 250 mg        250 mg Oral Daily 11/25/11 1122 11/30/11 0959   11/25/11 2000   cefTRIAXone (ROCEPHIN) 1 g in dextrose 5 % 50 mL IVPB  Status:  Discontinued        1 g 100 mL/hr over 30 Minutes Intravenous Every 24 hours 11/25/11 1817 12/08/11 0919   11/25/11 1130   azithromycin (ZITHROMAX) tablet 500 mg        500 mg Oral Daily 11/25/11 1122 11/25/11 1150          Assessment Principal Problem:  *Obturator hernia with obstruction  Active Problems:  DEMENTIA  HYPERTENSION  SYNCOPE  SEIZURE DISORDER  Implantable loop recorder  Hypoxia  CAP (community acquired pneumonia)  Elevated troponin s/p exp lap   LOS: 19 days   Plan:  1. Medical management per medicine. 2. Will need SNF post discharge 3. Continue with PT/OT  Blenda Mounts  12/13/11

## 2011-12-13 NOTE — Progress Notes (Signed)
pts BP manually 98/58, HR 64 NSR; pt asymptomatic; Dr.Harwani paged and made aware, told to hold ramapril and meteprolol for SBP<110 and given IV lasix; will continue to monitor

## 2011-12-14 ENCOUNTER — Inpatient Hospital Stay (HOSPITAL_COMMUNITY): Payer: Medicare Other

## 2011-12-14 LAB — BASIC METABOLIC PANEL
BUN: 21 mg/dL (ref 6–23)
CO2: 27 mEq/L (ref 19–32)
Glucose, Bld: 85 mg/dL (ref 70–99)
Potassium: 4.4 mEq/L (ref 3.5–5.1)
Sodium: 140 mEq/L (ref 135–145)

## 2011-12-14 LAB — CBC WITH DIFFERENTIAL/PLATELET
Basophils Relative: 1 % (ref 0–1)
Eosinophils Relative: 3 % (ref 0–5)
HCT: 28.4 % — ABNORMAL LOW (ref 36.0–46.0)
Hemoglobin: 9.3 g/dL — ABNORMAL LOW (ref 12.0–15.0)
Lymphocytes Relative: 34 % (ref 12–46)
MCH: 21.2 pg — ABNORMAL LOW (ref 26.0–34.0)
MCHC: 32.7 g/dL (ref 30.0–36.0)
Neutro Abs: 2.6 10*3/uL (ref 1.7–7.7)
Neutrophils Relative %: 53 % (ref 43–77)
RBC: 4.38 MIL/uL (ref 3.87–5.11)

## 2011-12-14 MED ORDER — FUROSEMIDE 40 MG PO TABS
40.0000 mg | ORAL_TABLET | Freq: Every day | ORAL | Status: DC
  Administered 2011-12-15: 40 mg via ORAL
  Filled 2011-12-14 (×2): qty 1

## 2011-12-14 MED ORDER — ALBUTEROL SULFATE (5 MG/ML) 0.5% IN NEBU
2.5000 mg | INHALATION_SOLUTION | Freq: Two times a day (BID) | RESPIRATORY_TRACT | Status: DC
  Administered 2011-12-14 – 2011-12-15 (×2): 2.5 mg via RESPIRATORY_TRACT
  Filled 2011-12-14 (×5): qty 0.5

## 2011-12-14 NOTE — Progress Notes (Signed)
I agree with the following treatment note after reviewing documentation.   Johnston, Orin Eberwein Brynn   OTR/L Pager: [phone] Office: [phone] .   

## 2011-12-14 NOTE — Progress Notes (Signed)
Subjective:  Patient denies any chest pain states breathing is improved. Appetite remains poor Objective:  Vital Signs in the last 24 hours: Temp:  [98.3 F (36.8 C)-98.7 F (37.1 C)] 98.4 F (36.9 C) (07/16 0400) Pulse Rate:  [58-70] 58  (07/16 0400) Resp:  [17-18] 18  (07/16 0400) BP: (93-110)/(56-61) 110/61 mmHg (07/16 0400) SpO2:  [94 %-100 %] 100 % (07/16 0844) Weight:  [48.6 kg (107 lb 2.3 oz)] 48.6 kg (107 lb 2.3 oz) (07/16 0500)  Intake/Output from previous day: 07/15 0701 - 07/16 0700 In: 360 [P.O.:360] Out: -  Intake/Output from this shift:    Physical Exam: Neck: no adenopathy, no carotid bruit, no JVD and supple, symmetrical, trachea midline Lungs: Bilateral rhonchi and rales noted. Air entry improved Heart: regular rate and rhythm, S1, S2 normal and Soft systolic murmur and S3 gallop noted Abdomen: soft, non-tender; bowel sounds normal; no masses,  no organomegaly Extremities: extremities normal, atraumatic, no cyanosis or edema  Lab Results:  Basename 12/14/11 0500  WBC 5.2  HGB 9.3*  PLT 233    Basename 12/14/11 0500  NA 140  K 4.4  CL 103  CO2 27  GLUCOSE 85  BUN 21  CREATININE 0.76   No results found for this basename: TROPONINI:2,CK,MB:2 in the last 72 hours Hepatic Function Panel No results found for this basename: PROT,ALBUMIN,AST,ALT,ALKPHOS,BILITOT,BILIDIR,IBILI in the last 72 hours No results found for this basename: CHOL in the last 72 hours No results found for this basename: PROTIME in the last 72 hours  Imaging: Imaging results have been reviewed and Dg Chest 2 View  12/14/2011  *RADIOLOGY REPORT*  Clinical Data: Follow-up CHF, improved breathing  CHEST - 2 VIEW  Comparison: 12/10/2011  Findings: The cardiac shadow is stable.  Mild persistent vascular congestion is noted with interstitial changes.  The overall appearance has slightly improved when compared with prior exam. There is persistent elevation of the right hemidiaphragm.  No new  focal abnormality is seen.  IMPRESSION: Slight improved vascular congestion with interstitial changes. Continued follow-up is recommended as necessary.  Original Report Authenticated By: Phillips Odor, M.D.    Cardiac Studies:  Assessment/Plan:  Gram-positive cocci bacteremia probably skin contaminant  Resolving Acute diastolic heart failure  Possible bilateral aspiration pneumonia  Status post exploratory laparotomy/obturator hernia repair was small bowel obstruction  Status post possible small non-Q-wave myocardial infarction  Hypertension  History of syncope in the past  Dementia  History as a general seizure disorder  Chronic anemia  Malnutrition  Status post postop ileus  Plan DC Vanco Advance diet as tolerated increase ambulation. Change IV Lasix to by mouth  LOS: 20 days    Jazmene Racz N 12/14/2011, 10:42 AM

## 2011-12-14 NOTE — Progress Notes (Signed)
Occupational Therapy Treatment Patient Details Name: [name] MRN: [id number] DOB: Jan 12, 1930 Today's Date: 12/14/2011 Time: 1191-4782 OT Time Calculation (min): 19 min  OT Assessment / Plan / Recommendation Comments on Treatment Session Pt. lethargic in treatment today, but doing well with transfers.     Follow Up Recommendations  Skilled nursing facility;Supervision/Assistance - 24 hour    Barriers to Discharge       Equipment Recommendations  Defer to next venue    Recommendations for Other Services Rehab consult  Frequency Min 2X/week   Plan Discharge plan remains appropriate    Precautions / Restrictions Precautions Precautions: Fall Restrictions Weight Bearing Restrictions: No   Pertinent Vitals/Pain No pain reported.     ADL  Grooming: Performed;Wash/dry face;Min guard Where Assessed - Grooming: Supported standing Toilet Transfer: Mining engineer Method: Sit to Barista: Regular height toilet Equipment Used: Gait belt;Rolling walker ADL Comments: Pt. ambulated to sink with RW and able to stand and wash face with Min G while standing supported. She asked to sit down after washing face due to fatigue.       OT Goals Acute Rehab OT Goals OT Goal Formulation: With patient/family Time For Goal Achievement: 12/25/11 Potential to Achieve Goals: Good ADL Goals Pt Will Perform Grooming: Standing at sink;with set-up;Other (comment) (for one task) ADL Goal: Grooming - Progress: Progressing toward goals Pt Will Perform Upper Body Bathing: Sitting at sink;with set-up Pt Will Perform Lower Body Bathing: Sit to stand from chair;with min assist Pt Will Perform Upper Body Dressing: with set-up;Sitting, bed;Sitting, chair Pt Will Perform Lower Body Dressing: Sit to stand from bed;Sit to stand from chair;with set-up Pt Will Transfer to Toilet: 3-in-1;with supervision;Stand pivot transfer ADL Goal: Toilet Transfer -  Progress: Progressing toward goals Pt Will Perform Toileting - Clothing Manipulation: Standing;with min assist Pt Will Perform Toileting - Hygiene: with min assist;Sit to stand from 3-in-1/toilet Pt Will Perform Tub/Shower Transfer: Tub transfer;with supervision;Transfer tub bench  Visit Information  Last OT Received On: 12/14/11 Assistance Needed: +1    Subjective Data   "Can I sit down?"   Prior Functioning       Cognition  Arousal/Alertness: Lethargic Orientation Level: Disoriented to;Time;Place Behavior During Session: Lethargic   Mobility Bed Mobility Bed Mobility: Sitting - Scoot to Edge of Bed;Sit to Supine;Scooting to [facility];Supine to Sit Supine to Sit: 4: Min assist (assit for trunk) Sitting - Scoot to Edge of Bed: 4: Min assist Sit to Supine: 4: Min assist (assist to lift legs on bed) Scooting to HOB: 4: Min assist Details for Bed Mobility Assistance: Pt. lethargic today during treatment  Transfers Transfers: Sit to Stand;Stand to Sit Sit to Stand: 4: Min assist;With upper extremity assist;From bed;From chair/3-in-1 Stand to Sit: 4: Min guard;With upper extremity assist;To bed;To chair/3-in-1 Details for Transfer Assistance: Vc's to scoot to edge of chair and hand placement.           End of Session OT - End of Session Activity Tolerance: Patient limited by fatigue Patient left: in bed;with call bell/phone within reach;with bed alarm set  GO     Jenell Milliner 12/14/2011, 3:14 PM

## 2011-12-14 NOTE — Progress Notes (Signed)
Physical Therapy Treatment Patient Details Name: [name] MRN: [id number] DOB: Jul 03, 1929 Today's Date: 12/14/2011 Time: 1032-1100 PT Time Calculation (min): 28 min  PT Assessment / Plan / Recommendation Comments on Treatment Session  Patient with multiple loose stools requiring cleaning throughout treatment, but she continues to improve her independence in functional mobility.     Follow Up Recommendations  Skilled nursing facility    Barriers to Discharge  None      Equipment Recommendations  Defer to next venue    Recommendations for Other Services  None  Frequency Min 3X/week   Plan Discharge plan remains appropriate;Frequency remains appropriate    Precautions / Restrictions Precautions Precautions: Fall   Pertinent Vitals/Pain VSS/ Pain of perenium secondary to multiple loose bowels resulting in frequent cleaning.      Mobility  Bed Mobility Rolling Right: 7: Independent;With rail Rolling Left: 7: Independent;With rail Right Sidelying to Sit: 4: Min guard;HOB flat;With rails Sitting - Scoot to Edge of Bed: 4: Min guard Details for Bed Mobility Assistance: Improved speed and efficiency with movement. Verbal cues for sequencing of side to sit. Transfers Sit to Stand: 4: Min assist;From bed;With upper extremity assist Stand to Sit: 4: Min guard;With upper extremity assist;To chair/3-in-1 Details for Transfer Assistance: Patient stood approx 3 times for peri-care and changing of gowns twice secondary to bowel movements while in room - RN notified of loose and frequent stool. On third stand patient able to complete task with supervision only.  Ambulation/Gait Ambulation/Gait Assistance: 4: Min guard Ambulation Distance (Feet): 50 Feet Assistive device: Rolling walker Ambulation/Gait Assistance Details: Patient continues to make improvements in speed and efficiency. Verbal cues for posture and to encourage increased distance.  Gait Pattern: Step-through pattern;Trunk  flexed     PT Goals Acute Rehab PT Goals PT Goal: Supine/Side to Sit - Progress: Progressing toward goal PT Goal: Sit to Stand - Progress: Progressing toward goal PT Goal: Stand to Sit - Progress: Progressing toward goal PT Goal: Ambulate - Progress: Progressing toward goal  Visit Information  Last PT Received On: 12/14/11 Assistance Needed: +1    Subjective Data  Subjective: Patient reports sore buttock region Patient Stated Goal: Walk    Cognition  Arousal/Alertness: Awake/alert Orientation Level: Appears intact for tasks assessed Behavior During Session: [facility] for tasks performed Cognition - Other Comments: Slow to follow commands    Balance  Static Standing Balance Static Standing - Balance Support: Bilateral upper extremity supported Static Standing - Level of Assistance: 6: Modified independent (Device/Increase time) Static Standing - Comment/# of Minutes: at support of walker for peri-care.   End of Session PT - End of Session Equipment Utilized During Treatment: Gait belt Activity Tolerance: Patient tolerated treatment well Patient left: in chair;with call bell/phone within reach;with family/visitor present Nurse Communication: Mobility status    Edwyna Perfect, PT  Pager [phone]  12/14/2011, 11:19 AM

## 2011-12-14 NOTE — Progress Notes (Signed)
I agree with the following treatment note after reviewing documentation.   Johnston, Amelianna Meller Brynn   OTR/L Pager: [phone] Office: [phone] .   

## 2011-12-14 NOTE — Progress Notes (Signed)
Occupational Therapy Treatment Patient Details Name: [name] MRN: [id number] DOB: January 20, 1930 Today's Date: 12/14/2011 Time: 3086-5784 OT Time Calculation (min): 19 min  OT Assessment / Plan / Recommendation Comments on Treatment Session Pt. lethargic in treatment today, but doing well with transfers.     Follow Up Recommendations  Skilled nursing facility;Supervision/Assistance - 24 hour    Barriers to Discharge       Equipment Recommendations  Defer to next venue    Recommendations for Other Services Rehab consult  Frequency Min 2X/week   Plan Discharge plan remains appropriate    Precautions / Restrictions Precautions Precautions: Fall Restrictions Weight Bearing Restrictions: No   Pertinent Vitals/Pain No pain reported.     ADL  Eating/Feeding: Performed;Independent Where Assessed - Eating/Feeding: Bed level Grooming: Performed;Wash/dry face;Min guard Where Assessed - Grooming: Supported standing Toilet Transfer: Mining engineer Method: Sit to Barista: Regular height toilet Equipment Used: Gait belt;Rolling walker ADL Comments: Pt. ambulated to sink with RW and able to stand and wash face with Min G while standing supported. She asked to sit down after washing face due to fatigue. Pt. drinking ensure at end of session.       OT Goals Acute Rehab OT Goals OT Goal Formulation: With patient/family Time For Goal Achievement: 12/25/11 Potential to Achieve Goals: Good ADL Goals Pt Will Perform Grooming: Standing at sink;with set-up;Other (comment) (for one task) ADL Goal: Grooming - Progress: Progressing toward goals Pt Will Perform Upper Body Bathing: Sitting at sink;with set-up Pt Will Perform Lower Body Bathing: Sit to stand from chair;with min assist Pt Will Perform Upper Body Dressing: with set-up;Sitting, bed;Sitting, chair Pt Will Perform Lower Body Dressing: Sit to stand from bed;Sit to stand from  chair;with set-up Pt Will Transfer to Toilet: 3-in-1;with supervision;Stand pivot transfer ADL Goal: Toilet Transfer - Progress: Progressing toward goals Pt Will Perform Toileting - Clothing Manipulation: Standing;with min assist Pt Will Perform Toileting - Hygiene: with min assist;Sit to stand from 3-in-1/toilet Pt Will Perform Tub/Shower Transfer: Tub transfer;with supervision;Transfer tub bench  Visit Information  Last OT Received On: 12/14/11 Assistance Needed: +1    Subjective Data   "Can I sit down?"   Prior Functioning       Cognition  Overall Cognitive Status: Appears within functional limits for tasks assessed/performed Arousal/Alertness: Lethargic Orientation Level: Disoriented to;Time;Place Behavior During Session: Lethargic    Mobility Bed Mobility Bed Mobility: Sitting - Scoot to Edge of Bed;Sit to Supine;Scooting to [facility];Supine to Sit Supine to Sit: 4: Min assist (assit for trunk) Sitting - Scoot to Edge of Bed: 4: Min assist Sit to Supine: 4: Min assist (assist to lift legs on bed) Scooting to HOB: 4: Min assist Details for Bed Mobility Assistance: Pt. lethargic today during treatment  Transfers Transfers: Sit to Stand;Stand to Sit Sit to Stand: 4: Min assist;With upper extremity assist;From bed;From chair/3-in-1 Stand to Sit: 4: Min guard;With upper extremity assist;To bed;To chair/3-in-1           End of Session OT - End of Session Activity Tolerance: Patient limited by fatigue Patient left: in bed;with call bell/phone within reach;with bed alarm set  GO     Jenell Milliner 12/14/2011, 3:19 PM

## 2011-12-15 MED ORDER — SIMVASTATIN 20 MG PO TABS: 20.0000 mg | ORAL_TABLET | Freq: Every day | ORAL | Status: DC

## 2011-12-15 MED ORDER — FUROSEMIDE 40 MG PO TABS: 40.0000 mg | ORAL_TABLET | Freq: Every day | ORAL | Status: DC

## 2011-12-15 MED ORDER — ENSURE COMPLETE PO LIQD: 237.0000 mL | Freq: Two times a day (BID) | ORAL | Status: AC

## 2011-12-15 MED ORDER — POTASSIUM CHLORIDE CRYS ER 20 MEQ PO TBCR: 20.0000 meq | EXTENDED_RELEASE_TABLET | Freq: Every day | ORAL | Status: DC

## 2011-12-15 MED ORDER — ASPIRIN 81 MG PO TBEC: 81.0000 mg | DELAYED_RELEASE_TABLET | Freq: Every day | ORAL | Status: AC

## 2011-12-15 MED ORDER — RAMIPRIL 2.5 MG PO CAPS: 2.5000 mg | ORAL_CAPSULE | Freq: Every day | ORAL | Status: DC

## 2011-12-15 NOTE — Discharge Summary (Signed)
  Prior to discharge summary dictated on 12/15/2011 dictation number is [id number]

## 2011-12-15 NOTE — Progress Notes (Signed)
Subjective:  Patient denies any chest pain states breathing is improved denies abdominal pain appetite remains poor  Objective:  Vital Signs in the last 24 hours: Temp:  [98 F (36.7 C)-98.6 F (37 C)] 98 F (36.7 C) (07/17 0500) Pulse Rate:  [58-67] 59  (07/17 0500) Resp:  [18-20] 18  (07/17 0500) BP: (92-106)/(48-66) 106/62 mmHg (07/17 0500) SpO2:  [99 %-100 %] 100 % (07/17 0500) Weight:  [44.3 kg (97 lb 10.6 oz)] 44.3 kg (97 lb 10.6 oz) (07/17 0500)  Intake/Output from previous day: 07/16 0701 - 07/17 0700 In: 600 [P.O.:600] Out: 1 [Stool:1] Intake/Output from this shift:    Physical Exam: Neck: no adenopathy, no carotid bruit, no JVD and supple, symmetrical, trachea midline Lungs: Decreased breath sound at bases with occasional rhonchi and rales Heart: regular rate and rhythm, S1, S2 normal and Soft systolic murmur and S3 gallop noted Abdomen: soft, non-tender; bowel sounds normal; no masses,  no organomegaly Extremities: extremities normal, atraumatic, no cyanosis or edema  Lab Results:  Basename 12/14/11 0500  WBC 5.2  HGB 9.3*  PLT 233    Basename 12/14/11 0500  NA 140  K 4.4  CL 103  CO2 27  GLUCOSE 85  BUN 21  CREATININE 0.76   No results found for this basename: TROPONINI:2,CK,MB:2 in the last 72 hours Hepatic Function Panel No results found for this basename: PROT,ALBUMIN,AST,ALT,ALKPHOS,BILITOT,BILIDIR,IBILI in the last 72 hours No results found for this basename: CHOL in the last 72 hours No results found for this basename: PROTIME in the last 72 hours  Imaging: Imaging results have been reviewed and Dg Chest 2 View  12/14/2011  *RADIOLOGY REPORT*  Clinical Data: Follow-up CHF, improved breathing  CHEST - 2 VIEW  Comparison: 12/10/2011  Findings: The cardiac shadow is stable.  Mild persistent vascular congestion is noted with interstitial changes.  The overall appearance has slightly improved when compared with prior exam. There is persistent  elevation of the right hemidiaphragm.  No new focal abnormality is seen.  IMPRESSION: Slight improved vascular congestion with interstitial changes. Continued follow-up is recommended as necessary.  Original Report Authenticated By: Phillips Odor, M.D.    Cardiac Studies:  Assessment/Plan:  Resolving Acute diastolic heart failure  Possible bilateral aspiration pneumonia  Status post exploratory laparotomy/obturator hernia repair was small bowel obstruction  Status post possible small non-Q-wave myocardial infarction  Hypertension  History of syncope in the past  Dementia  History as a general seizure disorder  Chronic anemia  Malnutrition  Status post postop ileus  Plan Increase ambulation Advance diet as tolerated Incentive spirometry  LOS: 21 days    Yolanda Dockendorf N 12/15/2011, 8:19 AM

## 2011-12-15 NOTE — Progress Notes (Signed)
.  Clinical social worker assisted with patient discharge to skilled nursing facility, [facility]. .Patient transportation provided by [employer] with patient chart copy.   CSW send AVS to facility for medications required for discharge by 5pm. Pt will be able to transfer to snf when discharge summary is posted. DC summary has been changed to stat transcription status.   RN will fax discharge summary to facility and arrange transportation when pt is discharged. CSW provided RN with fax numbers and PTAR number for transportation.   .No further Clinical Social Work needs, signing off.   [name], [name]  [phone] .12/15/2011 1714pm

## 2011-12-15 NOTE — Care Management Note (Signed)
    Page 1 of 2   12/15/2011     2:19:52 PM   CARE MANAGEMENT NOTE 12/15/2011  Patient:  DMIYA, MALPHRUS   Account Number:  [id number]  Date Initiated:  11/26/2011  Documentation initiated by:  Jiles Crocker  Subjective/Objective Assessment:   ADMITTED WITH ?NONSMALL WAVE MI; BRONCHITIS RULE OUT PNEUMONIA     Action/Plan:   PCP: Dr. Sharyn Lull; LIVES ALONE; FOR POSSIBLE PCI;   Anticipated DC Date:  11/29/2011   Anticipated DC Plan:  HOME W HOME HEALTH SERVICES  In-house referral  Clinical Social Worker      DC Planning Services  CM consult      Choice offered to / List presented to:             Status of service:  Completed, signed off Medicare Important Message given?  NA - LOS <3 / Initial given by admissions (If response is "NO", the following Medicare IM given date fields will be blank) Date Medicare IM given:   Date Additional Medicare IM given:    Discharge Disposition:  SKILLED NURSING FACILITY  Per UR Regulation:  Reviewed for med. necessity/level of care/duration of stay  If discussed at Long Length of Stay Meetings, dates discussed:   12/01/2011  12/07/2011  12/09/2011  12/14/2011    Comments:  12/15/11- 1415- Donn Pierini RN, BSN [phone] Pt to d/c to SNF today- CSW following for placement needs.  12/13/11- 1445- Donn Pierini RN, BSN [phone] D/C plan remains SNF- CSW following- pt was for d/c on 12/10/11- developed resp distress +PNA and acute CHF- now resolving- cont. IV abx, po med adjust today.  12/07/11- 1044- Donn Pierini RN, BSN [phone] Pt s/p exp lap for bowel obstruction on 12/01/11- still not taking much po intake- d/c planning for SNF when medically stable for discharge- CSW following for placement needs.  12-01-11 0950 Tomi Bamberger, RN, BSN [phone]  Pt was discussed in Long Length of stay meeting today.   11/26/2011- B CHANDLER RN, BSN, MHA

## 2011-12-15 NOTE — Discharge Summary (Signed)
[name], [name] NO.:  [id number]  MEDICAL RECORD NO.:  [id number]  LOCATION:  3W05C                        FACILITY:  [facility]  PHYSICIAN:  Eduardo Osier. Sharyn Lull, M.D. DATE OF BIRTH:  1930-05-12  DATE OF ADMISSION:  11/24/2011 DATE OF DISCHARGE:  12/15/2011                              DISCHARGE SUMMARY   The patient was admitted by triad hospitalist and subsequently transferred to my service.  ADMITTING DIAGNOSES: 1. Probable small non-Q-wave myocardial infarction. 2. Mild decompensated heart failure secondary to valvular heart     disease, bronchitis, rule out pneumonia, hypertension. 3. History of syncope in the past. 4. Dementia. 5. History of questionable seizure disorder in the past. 6. Chronic anemia. 7. Gastroesophageal reflux disease.  DISCHARGE DIAGNOSES: 1. Status post probable small non-Q-wave myocardial infarction, status     post left catheterization, mild coronary artery disease. 2. Small bowel obstruction status post exploratory     laparotomy/obturator hernia repair. 3. Resolving acute diastolic heart failure. 4. Valvular heart disease. 5. Status post bilateral pneumonia. 6. Hypertension. 7. History of syncope in the past. 8. Dementia. 9. History of questionable seizure disorder. 10.Chronic anemia. 11.Malnutrition. 12.Status post postop ileus. 13.Coag-negative gram-positive bacteremia secondary to skin     contaminant and dementia.  DISCHARGE MEDICATIONS:  Enteric-coated aspirin 81 mg 1 tablet daily, feeding supplement to 37 mL twice daily, Lasix 40 mg 1 tablet daily, potassium chloride 20 mEq 1 tablet daily, ramipril 2.5 mg 1 capsule daily, simvastatin 20 mg 1 tablet daily at night, Aricept 5 mg 1 tablet daily, Nexium 40 mg 1 capsule daily, MiraLAX 17 g by mouth daily as needed, multivitamin 1 tablet daily.  DIET:  Low salt, low cholesterol.  ACTIVITY:  Increase activity slowly as tolerated.  Follow up with me in 2  weeks.  CONDITION ON DISCHARGE:  Stable.  BRIEF HISTORY AND HOSPITAL COURSE:  [name] is a 78 year old female with past medical history significant for coronary artery disease, history of non-Q-wave myocardial infarction in the past, hypertension, history of recurrent syncope status post loop recorder, GERD, dementia, chronic anemia, valvular heart disease, degenerative joint disease, history of questionable seizure disorder,  was admitted because of vague left-sided chest pain associated with shortness of breath and hypoxia. The patient also complains of cough with whitish yellow phlegm.  Denies any fever or chills.  The patient also complains of musculoskeletal pain in the back and hip.  The patient denies any history of trauma or fall. Cardiology consultation was called as the patient was noted to have mildly elevated troponin I.  PAST MEDICAL HISTORY:  As above.  PAST SURGICAL HISTORY:  She had loop recorder insertion in the past, had colonoscopy in the past, had cataract surgery in the past, had colonoscopy in the past.  PHYSICAL EXAMINATION:  On examination, her blood pressure was 151/82, pulse was 82.  She was afebrile.  Conjunctivae were pink.  Sclerae are nonicteric.  Neck:  Supple.  No JVD.  No bruit.  Lungs:  Decreased breath sounds at bases with bibasilar rales and occasional rhonchi. Cardiovascular:  S1, S2 was normal.  There was soft systolic murmur. There was 3/6 systolic murmur at the apex radiating to  axilla and soft S3 gallop.  Abdomen:  Soft.  Bowel sounds were present.  There was mild epigastric tenderness.  Extremities:  There is no clubbing, cyanosis, or edema.  LABS:  Her admission labs, sodium 135, potassium 4.2, BUN 36, creatinine 0.55.  Her cardiac enzymes, CK was 341, MB 9.3.  Second set CK, 255 MB 7.4.  Her troponin I was 0.48 and 0.45.  Hemoglobin was 9.8, hematocrit 31.4, white count of 7.3.  Her cholesterol was 133, HDL was 42, triglycerides 96,  and LDL of 72.  Her EKG done 11/26/2011 showed normal sinus rhythm, left atrial enlargement, right bundle-branch block pattern.  Abdominal x-rays showed dilated loops of small bowel with air- fluid levels and abnormal appearance which could reflect early obstruction bilateral basilar airspace opacities, possible pneumonia.  A 2D echo done on November 25, 2011, showed LV systolic function was approximately 45-50%, mild LVH, trivial AI and there was moderate mitral regurgitation.  BRIEF HOSPITAL COURSE:  The patient was admitted to telemetry unit.  The patient ruled in for small non-Q-wave myocardial infarction due to elevated cardiac enzymes and chest pain.  The patient subsequently underwent left cardiac cath with selective left and right coronary angiography which showed no evidence of critical stenosis.  The patient remained hemodynamically stable, but continued to have abdominal pain. GI consultation was obtained.  The patient was treated initially conservatively without much improvement and subsequently GI consultation was obtained and the patient underwent CT of the abdomen which showed intestinal obstruction and small bowel obstruction and obturator hernia requiring exploratory laparotomy and primary repair of obturator hernia by Dr. Violeta Gelinas on July 3.  The patient tolerated the procedure well.  There were no complications.  The patient postoperatively developed bilateral aspiration pneumonia and acute diastolic congestive heart failure requiring broad-spectrum antibiotics and IV Lasix with good diuresis.  The patient's appetite has remained poor, but gradually she is gaining her strength.  The patient's OT/PT consultation was called.  The patient has been seen ambulating in room with assistance. The patient will be discharged to [facility] and will be followed up in my office in 2 weeks and will follow up with Surgery as needed.     Eduardo Osier. Sharyn Lull,  M.D.     MNH/MEDQ  D:  12/15/2011  T:  12/15/2011  Job:  [id number]

## 2011-12-16 LAB — CULTURE, BLOOD (ROUTINE X 2): Culture: NO GROWTH

## 2012-01-12 ENCOUNTER — Ambulatory Visit (INDEPENDENT_AMBULATORY_CARE_PROVIDER_SITE_OTHER): Payer: Medicare Other | Admitting: General Surgery

## 2012-01-12 ENCOUNTER — Emergency Department (HOSPITAL_COMMUNITY)
Admission: EM | Admit: 2012-01-12 | Discharge: 2012-01-12 | Disposition: A | Discharge status: Home / Self Care | Payer: Medicare Other | Attending: Emergency Medicine | Admitting: Emergency Medicine

## 2012-01-12 ENCOUNTER — Encounter (HOSPITAL_COMMUNITY): Payer: Self-pay | Admitting: Emergency Medicine

## 2012-01-12 ENCOUNTER — Encounter (INDEPENDENT_AMBULATORY_CARE_PROVIDER_SITE_OTHER): Payer: Self-pay | Admitting: General Surgery

## 2012-01-12 ENCOUNTER — Emergency Department (HOSPITAL_COMMUNITY): Payer: Medicare Other

## 2012-01-12 VITALS — BP 100/56 | HR 69 | Temp 97.2°F | Ht 61.0 in

## 2012-01-12 DIAGNOSIS — I10 Essential (primary) hypertension: Secondary | ICD-10-CM | POA: Insufficient documentation

## 2012-01-12 DIAGNOSIS — Z7982 Long term (current) use of aspirin: Secondary | ICD-10-CM | POA: Insufficient documentation

## 2012-01-12 DIAGNOSIS — K450 Other specified abdominal hernia with obstruction, without gangrene: Secondary | ICD-10-CM

## 2012-01-12 DIAGNOSIS — K5900 Constipation, unspecified: Secondary | ICD-10-CM

## 2012-01-12 DIAGNOSIS — Z79899 Other long term (current) drug therapy: Secondary | ICD-10-CM | POA: Insufficient documentation

## 2012-01-12 DIAGNOSIS — K5730 Diverticulosis of large intestine without perforation or abscess without bleeding: Secondary | ICD-10-CM | POA: Insufficient documentation

## 2012-01-12 DIAGNOSIS — I2510 Atherosclerotic heart disease of native coronary artery without angina pectoris: Secondary | ICD-10-CM | POA: Insufficient documentation

## 2012-01-12 DIAGNOSIS — I517 Cardiomegaly: Secondary | ICD-10-CM | POA: Insufficient documentation

## 2012-01-12 DIAGNOSIS — R109 Unspecified abdominal pain: Secondary | ICD-10-CM

## 2012-01-12 DIAGNOSIS — Z8673 Personal history of transient ischemic attack (TIA), and cerebral infarction without residual deficits: Secondary | ICD-10-CM | POA: Insufficient documentation

## 2012-01-12 DIAGNOSIS — F0390 Unspecified dementia without behavioral disturbance: Secondary | ICD-10-CM | POA: Insufficient documentation

## 2012-01-12 LAB — COMPREHENSIVE METABOLIC PANEL
AST: 71 U/L — ABNORMAL HIGH (ref 0–37)
BUN: 27 mg/dL — ABNORMAL HIGH (ref 6–23)
CO2: 21 mEq/L (ref 19–32)
Chloride: 105 mEq/L (ref 96–112)
Creatinine, Ser: 0.61 mg/dL (ref 0.50–1.10)
GFR calc Af Amer: >90 mL/min (ref >90)
GFR calc non Af Amer: 84 mL/min — ABNORMAL LOW (ref >90)
Glucose, Bld: 94 mg/dL (ref 70–99)
Total Bilirubin: 0.5 mg/dL (ref 0.3–1.2)

## 2012-01-12 LAB — CBC WITH DIFFERENTIAL/PLATELET
Basophils Relative: 0 % (ref 0–1)
Eosinophils Relative: 0 % (ref 0–5)
HCT: 33.3 % — ABNORMAL LOW (ref 36.0–46.0)
Hemoglobin: 10.8 g/dL — ABNORMAL LOW (ref 12.0–15.0)
Lymphs Abs: 1 10*3/uL (ref 0.7–4.0)
MCH: 20.7 pg — ABNORMAL LOW (ref 26.0–34.0)
MCV: 63.9 fL — ABNORMAL LOW (ref 78.0–100.0)
Monocytes Relative: 5 % (ref 3–12)
Neutro Abs: 7 10*3/uL (ref 1.7–7.7)
RBC: 5.21 MIL/uL — ABNORMAL HIGH (ref 3.87–5.11)
WBC: 8.4 10*3/uL (ref 4.0–10.5)

## 2012-01-12 LAB — URINALYSIS, ROUTINE W REFLEX MICROSCOPIC
Glucose, UA: NEGATIVE mg/dL
Ketones, ur: NEGATIVE mg/dL
Leukocytes, UA: NEGATIVE
Protein, ur: NEGATIVE mg/dL
Urobilinogen, UA: 0.2 mg/dL (ref 0.0–1.0)

## 2012-01-12 MED ORDER — PEG 3350-KCL-NABCB-NACL-NASULF 236 G PO SOLR: 240.0000 mL | ORAL | Status: AC

## 2012-01-12 MED ORDER — IOHEXOL 300 MG/ML  SOLN
20.0000 mL | INTRAMUSCULAR | Status: AC
  Administered 2012-01-12 (×2): 20 mL via ORAL

## 2012-01-12 MED ORDER — ONDANSETRON HCL 4 MG/2ML IJ SOLN: 4.0000 mg | Freq: Once | INTRAMUSCULAR | Status: DC

## 2012-01-12 MED ORDER — IOHEXOL 300 MG/ML  SOLN
75.0000 mL | Freq: Once | INTRAMUSCULAR | Status: AC | PRN
  Administered 2012-01-12: 75 mL via INTRAVENOUS

## 2012-01-12 MED ORDER — METOCLOPRAMIDE HCL 10 MG PO TABS: 10.0000 mg | ORAL_TABLET | Freq: Four times a day (QID) | ORAL | Status: DC | PRN

## 2012-01-12 MED ORDER — SODIUM CHLORIDE 0.9 % IV BOLUS (SEPSIS)
500.0000 mL | Freq: Once | INTRAVENOUS | Status: AC
  Administered 2012-01-12: 500 mL via INTRAVENOUS

## 2012-01-12 NOTE — Progress Notes (Signed)
Subjective:     Patient ID: [name], female   DOB: 10/21/1929, 78 y.o.   MRN: [id number]  HPI Patient is status post repair of obturator hernia with bowel obstruction. She developed nausea and vomiting in our waiting room. She claims she has been eating and moving her bowels. Her family claims she has been progressing with physical therapy at [facility]  Review of Systems     Objective:   Physical Exam  Cardiovascular: Normal rate.   Pulmonary/Chest: Effort normal and breath sounds normal. No respiratory distress.  Abdominal: Soft. She exhibits no distension. There is no tenderness. There is no rebound and no guarding.       Bowel sounds are active and normal in character. Incision is well-healed.  Musculoskeletal:       In wheelchair  Neurological:       During the examination patient became less alert and had another episode of vomiting a small amount, and mental status improved thereafter       Assessment:     Nausea and vomiting status post repair of obturator hernia with obstruction    Plan:     Patient appears somewhat dehydrated and is continuing to have nausea and vomiting. I have contacted EMS to transport the patient to the emergency room. I will discuss with my partner who is at the hospital today to let him know what is going on.

## 2012-01-12 NOTE — ED Notes (Signed)
Transportation service called, will be sending handicap [name] to pick pt up at 1730. Service is [employer]

## 2012-01-12 NOTE — ED Notes (Signed)
Per [facility], patient was at [facility] for a post op visit after recent SBO repair surgery.  Patient has had no problems post op until today while in MD's office.  At that time she threw up x 2 and was having RLQ pain with radiation to groin and back.   Patient is poor historian, but claims that she "felt fine until today".  No medications given by EMS, no IV access obtained.

## 2012-01-12 NOTE — ED Notes (Signed)
Pt ambulated to bathroom by EMT Onalee Hua. 1 person assistance to and from restroom

## 2012-01-12 NOTE — ED Notes (Signed)
Pt denies nausea.

## 2012-01-12 NOTE — ED Provider Notes (Signed)
History     CSN: [id number]  Arrival date & time 01/12/12  1144   None     Chief Complaint  Patient presents with  . Abdominal Pain    (Consider location/radiation/quality/duration/timing/severity/associated sxs/prior treatment) Patient is a 78 y.o. female presenting with abdominal pain. The history is provided by the patient and the EMS personnel. The history is limited by the condition of the patient (dementia).  Abdominal Pain The primary symptoms of the illness include abdominal pain.   21-year-old female was sent to the emergency department after vomiting in her surgeon's office and complaining of right lower quadrant pain at that time. She is a very poor historian and tells me that she was hurting before but she is not hurting now. She denies nausea, fever, chills. Past Medical History  Diagnosis Date  . Syncope   . Unspecified cardiac device in situ     Loop recorder  . Atypical chest pain     Nonobstructive catheterization; negative Myoview 2011  . CVA 11/23/2008  . SEIZURE DISORDER 11/23/2008  . DEMENTIA 11/23/2008  . HYPERTENSION 11/23/2008  . Coronary artery disease   . 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 Heart attack     Past Surgical History  Procedure Date  . Loop recorder insertion   . Colonoscopy   . Eye surgery   . Cataract extraction, bilateral   . Laparotomy 11/30/2011    Procedure: EXPLORATORY LAPAROTOMY;  Surgeon: Monica Malady, MD;  Location: [facility] OR;  Service: General;  Laterality: N/A;  Exploratory Laparotomy with Primary Repair Obturator Hernia    Family History  Problem Relation Age of Onset  . Heart failure Neg Hx   . Stroke    . Heart disease      History  Substance Use Topics  . Smoking status: Former Games developer  . Smokeless tobacco: Never Used  . Alcohol Use: No    OB History    Grav Para Term Preterm Abortions TAB SAB Ect Mult Living                  Review of Systems  Unable to perform ROS: Dementia  Gastrointestinal:  Positive for abdominal pain.    Allergies  Codeine  Home Medications   Current Outpatient Rx  Name Route Sig Dispense Refill  . ASPIRIN 81 MG PO TBEC Oral Take 1 tablet (81 mg total) by mouth daily. 30 tablet 3  . DONEPEZIL HCL 5 MG PO TABS Oral Take 1 tablet by mouth daily.    Marland Kitchen ENSURE COMPLETE PO LIQD Oral Take 237 mLs by mouth 2 (two) times daily between meals. 60 Bottle 1  . FUROSEMIDE 40 MG PO TABS Oral Take 1 tablet (40 mg total) by mouth daily. 30 tablet 3  . ONE-DAILY MULTI VITAMINS PO TABS Oral Take 1 tablet by mouth daily.      Marland Kitchen NEXIUM 40 MG PO CPDR Oral Take 1 tablet by mouth daily.    Marland Kitchen POLYETHYLENE GLYCOL 3350 PO PACK Oral Take 17 g by mouth daily.      Marland Kitchen POTASSIUM CHLORIDE CRYS ER 20 MEQ PO TBCR Oral Take 1 tablet (20 mEq total) by mouth daily. 30 tablet 3  . RAMIPRIL 2.5 MG PO CAPS Oral Take 1 capsule (2.5 mg total) by mouth daily. 30 capsule 3  . SIMVASTATIN 20 MG PO TABS Oral Take 1 tablet (20 mg total) by mouth daily at 6 PM. 30 tablet 3    BP 110/61  Pulse 68  Temp 97.5 F (36.4 C) (Oral)  Resp 18  SpO2 97%  Physical Exam  Nursing note and vitals reviewed.  78year old female, resting comfortably and in no acute distress. Vital signs are normal. Oxygen saturation is 97%, which is normal. Head is normocephalic and atraumatic. PERRLA, EOMI. Oropharynx is clear. Neck is nontender and supple without adenopathy or JVD. Back is nontender and there is no CVA tenderness. Lungs are clear without rales, wheezes, or rhonchi. Chest is nontender. Heart has regular rate and rhythm without murmur. Abdomen is soft, flat, mild right lower quadrant tenderness. No rebound or guarding. No masses or hepatosplenomegaly and peristalsis is hypoactive. Extremities have no cyanosis or edema, full range of motion is present. Skin is warm and dry without rash. Neurologic: She is oriented to person and place, but not time. Cranial nerves are intact, there are no motor or sensory  deficits.  ED Course  Procedures (including critical care time)  Results for orders placed during the hospital encounter of 01/12/12  CBC WITH DIFFERENTIAL      Component Value Range   WBC 8.4  4.0 - 10.5 K/uL   RBC 5.21 (*) 3.87 - 5.11 MIL/uL   Hemoglobin 10.8 (*) 12.0 - 15.0 g/dL   HCT 21.3 (*) 08.6 - 57.8 %   MCV 63.9 (*) 78.0 - 100.0 fL   MCH 20.7 (*) 26.0 - 34.0 pg   MCHC 32.4  30.0 - 36.0 g/dL   RDW 46.9 (*) 62.9 - 52.8 %   Platelets 196  150 - 400 K/uL   Neutrophils Relative 83 (*) 43 - 77 %   Lymphocytes Relative 12  12 - 46 %   Monocytes Relative 5  3 - 12 %   Eosinophils Relative 0  0 - 5 %   Basophils Relative 0  0 - 1 %   Neutro Abs 7.0  1.7 - 7.7 K/uL   Lymphs Abs 1.0  0.7 - 4.0 K/uL   Monocytes Absolute 0.4  0.1 - 1.0 K/uL   Eosinophils Absolute 0.0  0.0 - 0.7 K/uL   Basophils Absolute 0.0  0.0 - 0.1 K/uL   RBC Morphology ELLIPTOCYTES    COMPREHENSIVE METABOLIC PANEL      Component Value Range   Sodium 138  135 - 145 mEq/L   Potassium 5.9 (*) 3.5 - 5.1 mEq/L   Chloride 105  96 - 112 mEq/L   CO2 21  19 - 32 mEq/L   Glucose, Bld 94  70 - 99 mg/dL   BUN 27 (*) 6 - 23 mg/dL   Creatinine, Ser 4.13  0.50 - 1.10 mg/dL   Calcium 9.3  8.4 - 24.4 mg/dL   Total Protein 8.8 (*) 6.0 - 8.3 g/dL   Albumin 3.4 (*) 3.5 - 5.2 g/dL   AST 71 (*) 0 - 37 U/L   ALT 26  0 - 35 U/L   Alkaline Phosphatase 61  39 - 117 U/L   Total Bilirubin 0.5  0.3 - 1.2 mg/dL   GFR calc non Af Amer 84 (*) >90 mL/min   GFR calc Af Amer >90  >90 mL/min  LIPASE, BLOOD      Component Value Range   Lipase 113 (*) 11 - 59 U/L  URINALYSIS, ROUTINE W REFLEX MICROSCOPIC      Component Value Range   Color, Urine YELLOW  YELLOW   APPearance CLEAR  CLEAR   Specific Gravity, Urine 1.010  1.005 - 1.030   pH 5.5  5.0 -  8.0   Glucose, UA NEGATIVE  NEGATIVE mg/dL   Hgb urine dipstick NEGATIVE  NEGATIVE   Bilirubin Urine NEGATIVE  NEGATIVE   Ketones, ur NEGATIVE  NEGATIVE mg/dL   Protein, ur NEGATIVE   NEGATIVE mg/dL   Urobilinogen, UA 0.2  0.0 - 1.0 mg/dL   Nitrite NEGATIVE  NEGATIVE   Leukocytes, UA NEGATIVE  NEGATIVE   Dg Chest 2 View  12/14/2011  *RADIOLOGY REPORT*  Clinical Data: Follow-up CHF, improved breathing  CHEST - 2 VIEW  Comparison: 12/10/2011  Findings: The cardiac shadow is stable.  Mild persistent vascular congestion is noted with interstitial changes.  The overall appearance has slightly improved when compared with prior exam. There is persistent elevation of the right hemidiaphragm.  No new focal abnormality is seen.  IMPRESSION: Slight improved vascular congestion with interstitial changes. Continued follow-up is recommended as necessary.  Original Report Authenticated By: Phillips Odor, M.D.   Ct Abdomen Pelvis W Contrast  01/12/2012  *RADIOLOGY REPORT*  Clinical Data: Abdominal pain.  Status post repair of obturator hernia with bowel obstruction.  The patient now has nausea and vomiting.  CT ABDOMEN AND PELVIS WITH CONTRAST  Technique:  Multidetector CT imaging of the abdomen and pelvis was performed following the standard protocol during bolus administration of intravenous contrast.  Contrast:  75 ml Optiray-300.  Comparison: CT abdomen and pelvis 11/30/2011.  Findings: There is bibasilar airspace disease, more notable on the left.  Cardiomegaly is noted.  No pleural or pericardial effusion.  There is no small bowel obstruction.  The stomach and small bowel appear normal.  No pneumatosis, portal venous gas or free intraperitoneal air is identified.  The patient has a large volume stool in the rectosigmoid colon consistent with fecal impaction. Sigmoid diverticular disease without diverticulitis is identified. Previously seen obturator hernia on the left has been repaired.  No hernia is identified on today's examination.  The gallbladder, liver, spleen, adrenal glands, pancreas and kidneys appear normal. There is no lymphadenopathy or fluid.  No focal bony abnormality is identified.   Dense calcification just anterior to the right eleventh rib is unchanged and could be due to old trauma. The patient is status post hysterectomy.  IMPRESSION:  1.  Negative for bowel obstruction or other acute abnormality. 2.  Findings consistent with fecal impaction with a large volume of stool in the rectosigmoid colon. 3.  Diverticulosis without diverticulitis. 4.  Cardiomegaly.  Original Report Authenticated By: Bernadene Bell. D'ALESSIO, M.D.      1. Abdominal pain   2. Constipation       MDM  Right lower quadrant pain and vomiting in a patient is a very poor historian. CT scan will be obtained. Records are reviewed, and she had surgery one month ago for bowel obstruction due to an obturator hernia.  CT shows evidence of constipation, no other acute findings. She will be sent home with prescriptions for Reglan for nausea, and GoLYTELY to try and clear out the stool in the colon, and she will be given instruction sheet for constipation.      Dione Booze, MD 01/12/12 [phone]

## 2012-02-24 ENCOUNTER — Telehealth: Payer: Self-pay | Admitting: Internal Medicine

## 2012-02-24 NOTE — Telephone Encounter (Signed)
02-24-12 lmm @ 1031a for pt to call to set up loop recorder check (pacer) with klein or brooke/mt

## 2012-03-20 ENCOUNTER — Encounter: Payer: Medicare Other | Admitting: Internal Medicine

## 2012-03-24 ENCOUNTER — Encounter: Payer: Self-pay | Admitting: Internal Medicine

## 2012-03-24 ENCOUNTER — Ambulatory Visit (INDEPENDENT_AMBULATORY_CARE_PROVIDER_SITE_OTHER): Payer: Medicare Other | Admitting: Internal Medicine

## 2012-03-24 VITALS — BP 111/66 | HR 86 | Ht 61.0 in | Wt 103.0 lb

## 2012-03-24 DIAGNOSIS — R55 Syncope and collapse: Secondary | ICD-10-CM

## 2012-03-24 DIAGNOSIS — Z959 Presence of cardiac and vascular implant and graft, unspecified: Secondary | ICD-10-CM

## 2012-03-24 DIAGNOSIS — I428 Other cardiomyopathies: Secondary | ICD-10-CM | POA: Insufficient documentation

## 2012-03-24 NOTE — Progress Notes (Signed)
[email or web address] Patient Care Team: Robynn Pane, MD as PCP - General (Internal Medicine)   HPI  [name] is a 78 y.o. female seen in followup for syncope. She has had no intercurrent events. She is status post loop recorder implantation.     .  . She does have hypertension.  She was admitted to hospital in June. There is a question about a non-STEMI-small catheterization demonstrated no significant obstructive disease. Echocardiogram demonstrated mild LV dysfunction and 45-50% June 2013  She's had no recurrent syncope. She is continuing to get better from her pneumonia and heart failure   Past Medical History  Diagnosis Date  . Syncope   . Unspecified cardiac device in situ     Loop recorder  . Atypical chest pain     Nonobstructive catheterization; negative Myoview 2011  . CVA 11/23/2008  . SEIZURE DISORDER 11/23/2008  . DEMENTIA 11/23/2008  . HYPERTENSION 11/23/2008  . Coronary artery disease   . 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 Heart attack   . Other primary cardiomyopathies     Past Surgical History  Procedure Date  . Loop recorder insertion   . Colonoscopy   . Eye surgery   . Cataract extraction, bilateral   . Laparotomy 11/30/2011    Procedure: EXPLORATORY LAPAROTOMY;  Surgeon: Liz Malady, MD;  Location: [facility] OR;  Service: General;  Laterality: N/A;  Exploratory Laparotomy with Primary Repair Obturator Hernia    Current Outpatient Prescriptions  Medication Sig Dispense Refill  . aspirin EC 81 MG EC tablet Take 1 tablet (81 mg total) by mouth daily.  30 tablet  3  . donepezil (ARICEPT) 5 MG tablet Take 1 tablet by mouth daily.      . feeding supplement (ENSURE COMPLETE) LIQD Take 237 mLs by mouth 2 (two) times daily between meals.  60 Bottle  1  . furosemide (LASIX) 40 MG tablet Take 1 tablet (40 mg total) by mouth daily.  30 tablet  3  . Multiple Vitamin (MULTIVITAMIN WITH MINERALS) TABS Take 1 tablet by mouth daily.      Marland Kitchen omeprazole (PRILOSEC) 20  MG capsule Take 20 mg by mouth daily.      . polyethylene glycol (MIRALAX / GLYCOLAX) packet Take 17 g by mouth daily.        . potassium chloride SA (K-DUR,KLOR-CON) 20 MEQ tablet Take 1 tablet (20 mEq total) by mouth daily.  30 tablet  3  . simvastatin (ZOCOR) 20 MG tablet Take 20 mg by mouth every evening.      Marland Kitchen lisinopril (PRINIVIL,ZESTRIL) 5 MG tablet Take 2.5 mg by mouth daily.        Allergies  Allergen Reactions  . Codeine Other (See Comments)    UNKNOWN; Per MAR    Review of Systems negative except from HPI and PMH  Physical Exam BP 111/66  Pulse 86  Ht 5\' 1"  (1.549 m)  Wt 103 lb (46.72 kg)  BMI 19.46 kg/m2 Well developed and nourished in no acute distress HENT normal Neck supple with JVP-flat Clear Regular rate and rhythm, no murmurs or gallops Abd-soft with active BS No Clubbing cyanosis edema Skin-warm and dry A & Oriented  Grossly normal sensory and motor function Device pocket well healed; without hematoma or erythema    Assessment and  Plan

## 2012-03-24 NOTE — Patient Instructions (Signed)
Your physician recommends that you schedule a follow-up appointment in: 3 MONTHS WITH DR KLEIN  

## 2012-03-24 NOTE — Assessment & Plan Note (Signed)
The patient's device was interrogated.  The information was reviewed. No changes were made in the programming.    

## 2012-03-24 NOTE — Assessment & Plan Note (Signed)
No recurrent sycope

## 2012-03-29 NOTE — Addendum Note (Signed)
Addended by: Reine Just on: 03/29/2012 05:36 PM   Modules accepted: Orders

## 2012-06-13 ENCOUNTER — Encounter: Payer: Medicare Other | Admitting: Internal Medicine

## 2012-07-06 ENCOUNTER — Encounter: Payer: Self-pay | Admitting: *Deleted

## 2012-07-12 ENCOUNTER — Encounter: Payer: Self-pay | Admitting: Internal Medicine

## 2012-07-19 ENCOUNTER — Encounter: Payer: Self-pay | Admitting: Internal Medicine

## 2012-07-19 ENCOUNTER — Ambulatory Visit (INDEPENDENT_AMBULATORY_CARE_PROVIDER_SITE_OTHER): Payer: Medicare Other | Admitting: Internal Medicine

## 2012-07-19 VITALS — BP 104/64 | HR 72 | Ht 61.0 in | Wt 108.8 lb

## 2012-07-19 DIAGNOSIS — Z959 Presence of cardiac and vascular implant and graft, unspecified: Secondary | ICD-10-CM

## 2012-07-19 DIAGNOSIS — R0989 Other specified symptoms and signs involving the circulatory and respiratory systems: Secondary | ICD-10-CM

## 2012-07-19 DIAGNOSIS — R55 Syncope and collapse: Secondary | ICD-10-CM

## 2012-07-19 NOTE — Assessment & Plan Note (Addendum)
No Recurrent syncope

## 2012-07-19 NOTE — Progress Notes (Signed)
Patient Care Team: Robynn Pane, MD as PCP - General (Internal Medicine)   HPI  [name] is a 79 y.o. female seen in followup for syncope. She has had no intercurrent events. She is status post loop recorder implantation.  . . She does have hypertension.    She has had no intercurrent syncope. She has chronic shortness of breath.  Past Medical History  Diagnosis Date  . Syncope   . Unspecified cardiac device in situ     Loop recorder  . Atypical chest pain     Nonobstructive catheterization; negative Myoview 2011  . CVA 11/23/2008  . SEIZURE DISORDER 11/23/2008  . DEMENTIA 11/23/2008  . HYPERTENSION 11/23/2008  . Coronary artery disease   . 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 Heart attack   . Other primary cardiomyopathies     Past Surgical History  Procedure Laterality Date  . Loop recorder insertion    . Colonoscopy    . Eye surgery    . Cataract extraction, bilateral    . Laparotomy  11/30/2011    Procedure: EXPLORATORY LAPAROTOMY;  Surgeon: Liz Malady, MD;  Location: [facility] OR;  Service: General;  Laterality: N/A;  Exploratory Laparotomy with Primary Repair Obturator Hernia    Current Outpatient Prescriptions  Medication Sig Dispense Refill  . aspirin EC 81 MG EC tablet Take 1 tablet (81 mg total) by mouth daily.  30 tablet  3  . donepezil (ARICEPT) 5 MG tablet Take 1 tablet by mouth daily.      . feeding supplement (ENSURE COMPLETE) LIQD Take 237 mLs by mouth 2 (two) times daily between meals.  60 Bottle  1  . furosemide (LASIX) 40 MG tablet Take 1 tablet (40 mg total) by mouth daily.  30 tablet  3  . lisinopril (PRINIVIL,ZESTRIL) 5 MG tablet Take 2.5 mg by mouth daily.      . Multiple Vitamin (MULTIVITAMIN WITH MINERALS) TABS Take 1 tablet by mouth daily.      Marland Kitchen omeprazole (PRILOSEC) 20 MG capsule Take 20 mg by mouth daily.      . polyethylene glycol (MIRALAX / GLYCOLAX) packet Take 17 g by mouth daily.        . potassium chloride SA  (K-DUR,KLOR-CON) 20 MEQ tablet Take 1 tablet (20 mEq total) by mouth daily.  30 tablet  3  . simvastatin (ZOCOR) 20 MG tablet Take 20 mg by mouth every evening.       No current facility-administered medications for this visit.    Allergies  Allergen Reactions  . Codeine Other (See Comments)    UNKNOWN; Per MAR    Review of Systems negative except from HPI and PMH  Physical Exam BP 104/64  Pulse 72  Ht 5\' 1"  (1.549 m)  Wt 108 lb 12.8 oz (49.351 kg)  BMI 20.57 kg/m2 Well developed and nourished in no acute distress HENT normal Neck supple with JVP-flat Clear Device pocket well healed; without hematoma or erythema regular rate and rhythm, no murmurs or gallops Abd-soft with active BS No Clubbing cyanosis edema Skin-warm and dry A & Oriented  Grossly normal sensory and motor function     Assessment and  Plan

## 2012-07-19 NOTE — Patient Instructions (Addendum)
Dr. Graciela Husbands will see you on an as needed basis.

## 2012-07-19 NOTE — Assessment & Plan Note (Signed)
The device has reached EOL and is out of service. She is undecided as to whether she would like to have it removed. She will let us know. Otherwise we will see her when necessary she will follow up with her primary cardiologist

## 2013-01-20 ENCOUNTER — Emergency Department (HOSPITAL_COMMUNITY): Payer: Medicare Other

## 2013-01-20 ENCOUNTER — Observation Stay (HOSPITAL_COMMUNITY)
Admission: EM | Admit: 2013-01-20 | Discharge: 2013-01-22 | Disposition: A | Discharge status: Home / Self Care | Payer: Medicare Other | Attending: Cardiology | Admitting: Cardiology

## 2013-01-20 ENCOUNTER — Encounter (HOSPITAL_COMMUNITY): Payer: Self-pay | Admitting: Emergency Medicine

## 2013-01-20 DIAGNOSIS — D638 Anemia in other chronic diseases classified elsewhere: Secondary | ICD-10-CM | POA: Insufficient documentation

## 2013-01-20 DIAGNOSIS — K5790 Diverticulosis of intestine, part unspecified, without perforation or abscess without bleeding: Secondary | ICD-10-CM

## 2013-01-20 DIAGNOSIS — K219 Gastro-esophageal reflux disease without esophagitis: Secondary | ICD-10-CM | POA: Diagnosis not present

## 2013-01-20 DIAGNOSIS — R748 Abnormal levels of other serum enzymes: Secondary | ICD-10-CM | POA: Diagnosis not present

## 2013-01-20 DIAGNOSIS — R55 Syncope and collapse: Secondary | ICD-10-CM | POA: Diagnosis present

## 2013-01-20 DIAGNOSIS — E41 Nutritional marasmus: Secondary | ICD-10-CM | POA: Insufficient documentation

## 2013-01-20 DIAGNOSIS — F0390 Unspecified dementia without behavioral disturbance: Secondary | ICD-10-CM | POA: Diagnosis not present

## 2013-01-20 DIAGNOSIS — Z79899 Other long term (current) drug therapy: Secondary | ICD-10-CM | POA: Diagnosis not present

## 2013-01-20 DIAGNOSIS — I10 Essential (primary) hypertension: Secondary | ICD-10-CM | POA: Insufficient documentation

## 2013-01-20 DIAGNOSIS — R3989 Other symptoms and signs involving the genitourinary system: Secondary | ICD-10-CM | POA: Diagnosis not present

## 2013-01-20 DIAGNOSIS — R109 Unspecified abdominal pain: Secondary | ICD-10-CM

## 2013-01-20 DIAGNOSIS — IMO0002 Reserved for concepts with insufficient information to code with codable children: Secondary | ICD-10-CM | POA: Diagnosis not present

## 2013-01-20 LAB — CBC WITH DIFFERENTIAL/PLATELET
Basophils Relative: 0 % (ref 0–1)
Eosinophils Absolute: 0.1 10*3/uL (ref 0.0–0.7)
HCT: 29.8 % — ABNORMAL LOW (ref 36.0–46.0)
Hemoglobin: 9.8 g/dL — ABNORMAL LOW (ref 12.0–15.0)
Lymphocytes Relative: 15 % (ref 12–46)
MCH: 21.8 pg — ABNORMAL LOW (ref 26.0–34.0)
MCHC: 32.9 g/dL (ref 30.0–36.0)
Monocytes Absolute: 0.5 10*3/uL (ref 0.1–1.0)
Neutro Abs: 5.5 10*3/uL (ref 1.7–7.7)

## 2013-01-20 LAB — COMPREHENSIVE METABOLIC PANEL
ALT: 14 U/L (ref 0–35)
Albumin: 3.3 g/dL — ABNORMAL LOW (ref 3.5–5.2)
Alkaline Phosphatase: 66 U/L (ref 39–117)
Alkaline Phosphatase: 67 U/L (ref 39–117)
BUN: 34 mg/dL — ABNORMAL HIGH (ref 6–23)
CO2: 22 mEq/L (ref 19–32)
Calcium: 8.3 mg/dL — ABNORMAL LOW (ref 8.4–10.5)
GFR calc Af Amer: 65 mL/min — ABNORMAL LOW (ref >90)
GFR calc non Af Amer: 56 mL/min — ABNORMAL LOW (ref >90)
Glucose, Bld: 104 mg/dL — ABNORMAL HIGH (ref 70–99)
Potassium: 4.3 mEq/L (ref 3.5–5.1)
Potassium: 4.2 mEq/L (ref 3.5–5.1)
Sodium: 133 mEq/L — ABNORMAL LOW (ref 135–145)
Total Bilirubin: 0.4 mg/dL (ref 0.3–1.2)
Total Protein: 7.5 g/dL (ref 6.0–8.3)
Total Protein: 7.6 g/dL (ref 6.0–8.3)

## 2013-01-20 LAB — URINALYSIS, ROUTINE W REFLEX MICROSCOPIC
Ketones, ur: NEGATIVE mg/dL
Nitrite: NEGATIVE
Protein, ur: NEGATIVE mg/dL
Urobilinogen, UA: 1 mg/dL (ref 0.0–1.0)
pH: 5 (ref 5.0–8.0)

## 2013-01-20 LAB — CG4 I-STAT (LACTIC ACID): Lactic Acid, Venous: 1.54 mmol/L (ref 0.5–2.2)

## 2013-01-20 LAB — AMYLASE: Amylase: 137 U/L — ABNORMAL HIGH (ref 0–105)

## 2013-01-20 LAB — POCT I-STAT, CHEM 8
Calcium, Ion: 1.17 mmol/L (ref 1.13–1.30)
Glucose, Bld: 102 mg/dL — ABNORMAL HIGH (ref 70–99)
HCT: 33 % — ABNORMAL LOW (ref 36.0–46.0)
Hemoglobin: 11.2 g/dL — ABNORMAL LOW (ref 12.0–15.0)
TCO2: 23 mmol/L (ref 0–100)

## 2013-01-20 LAB — URINE MICROSCOPIC-ADD ON

## 2013-01-20 LAB — LIPASE, BLOOD: Lipase: 65 U/L — ABNORMAL HIGH (ref 11–59)

## 2013-01-20 LAB — APTT: aPTT: 27 seconds (ref 24–37)

## 2013-01-20 MED ORDER — POLYETHYLENE GLYCOL 3350 17 G PO PACK
17.0000 g | PACK | ORAL | Status: DC
  Administered 2013-01-21: 17 g via ORAL
  Filled 2013-01-20: qty 1

## 2013-01-20 MED ORDER — METOPROLOL TARTRATE 12.5 MG HALF TABLET
12.5000 mg | ORAL_TABLET | Freq: Two times a day (BID) | ORAL | Status: DC
  Administered 2013-01-21: 12.5 mg via ORAL
  Filled 2013-01-20 (×3): qty 1

## 2013-01-20 MED ORDER — DONEPEZIL HCL 5 MG PO TABS
5.0000 mg | ORAL_TABLET | Freq: Every day | ORAL | Status: DC
  Administered 2013-01-21 – 2013-01-22 (×2): 5 mg via ORAL
  Filled 2013-01-20 (×2): qty 1

## 2013-01-20 MED ORDER — ASPIRIN 300 MG RE SUPP
300.0000 mg | RECTAL | Status: AC
  Filled 2013-01-20: qty 1

## 2013-01-20 MED ORDER — IOHEXOL 300 MG/ML  SOLN
25.0000 mL | INTRAMUSCULAR | Status: DC
  Administered 2013-01-20: 25 mL via ORAL

## 2013-01-20 MED ORDER — ENSURE COMPLETE PO LIQD
237.0000 mL | Freq: Two times a day (BID) | ORAL | Status: DC
  Administered 2013-01-21 – 2013-01-22 (×3): 237 mL via ORAL

## 2013-01-20 MED ORDER — IOHEXOL 300 MG/ML  SOLN
80.0000 mL | Freq: Once | INTRAMUSCULAR | Status: AC | PRN
  Administered 2013-01-20: 80 mL via INTRAVENOUS

## 2013-01-20 MED ORDER — HEPARIN SODIUM (PORCINE) 5000 UNIT/ML IJ SOLN
5000.0000 [IU] | Freq: Three times a day (TID) | INTRAMUSCULAR | Status: DC
  Administered 2013-01-21 – 2013-01-22 (×5): 5000 [IU] via SUBCUTANEOUS
  Filled 2013-01-20 (×8): qty 1

## 2013-01-20 MED ORDER — SODIUM CHLORIDE 0.9 % IV BOLUS (SEPSIS)
1000.0000 mL | Freq: Once | INTRAVENOUS | Status: AC
  Administered 2013-01-20: 1000 mL via INTRAVENOUS

## 2013-01-20 MED ORDER — NITROGLYCERIN 0.4 MG SL SUBL: 0.4000 mg | SUBLINGUAL_TABLET | SUBLINGUAL | Status: DC | PRN

## 2013-01-20 MED ORDER — ASPIRIN 81 MG PO CHEW
324.0000 mg | CHEWABLE_TABLET | ORAL | Status: AC
  Administered 2013-01-21: 324 mg via ORAL
  Filled 2013-01-20: qty 4

## 2013-01-20 MED ORDER — SODIUM CHLORIDE 0.9 % IV SOLN
INTRAVENOUS | Status: DC
  Administered 2013-01-21 – 2013-01-22 (×3): via INTRAVENOUS

## 2013-01-20 MED ORDER — PANTOPRAZOLE SODIUM 40 MG PO TBEC
40.0000 mg | DELAYED_RELEASE_TABLET | Freq: Every day | ORAL | Status: DC
  Administered 2013-01-21 – 2013-01-22 (×2): 40 mg via ORAL
  Filled 2013-01-20: qty 1

## 2013-01-20 MED ORDER — ACETAMINOPHEN 325 MG PO TABS
650.0000 mg | ORAL_TABLET | Freq: Once | ORAL | Status: AC
  Administered 2013-01-21: 650 mg via ORAL
  Filled 2013-01-20: qty 2

## 2013-01-20 MED ORDER — ASPIRIN EC 81 MG PO TBEC
81.0000 mg | DELAYED_RELEASE_TABLET | Freq: Every day | ORAL | Status: DC
  Administered 2013-01-21 – 2013-01-22 (×2): 81 mg via ORAL
  Filled 2013-01-20 (×2): qty 1

## 2013-01-20 MED ORDER — ADULT MULTIVITAMIN W/MINERALS CH
1.0000 | ORAL_TABLET | Freq: Every day | ORAL | Status: DC
  Administered 2013-01-21 – 2013-01-22 (×2): 1 via ORAL
  Filled 2013-01-20 (×2): qty 1

## 2013-01-20 MED ORDER — ONDANSETRON HCL 4 MG/2ML IJ SOLN: 4.0000 mg | Freq: Four times a day (QID) | INTRAMUSCULAR | Status: DC | PRN

## 2013-01-20 MED ORDER — ACETAMINOPHEN 325 MG PO TABS
650.0000 mg | ORAL_TABLET | ORAL | Status: DC | PRN
  Administered 2013-01-21 (×2): 650 mg via ORAL
  Filled 2013-01-20 (×2): qty 2

## 2013-01-20 MED ORDER — SIMVASTATIN 20 MG PO TABS
20.0000 mg | ORAL_TABLET | Freq: Every evening | ORAL | Status: DC
  Administered 2013-01-21 (×2): 20 mg via ORAL
  Filled 2013-01-20 (×3): qty 1

## 2013-01-20 NOTE — H&P (Signed)
[name] is an 79 y.o. female.   Chief Complaint: Recurrent syncopal episode HPI: Patient is 79 year old female with past medical history significant for coronary artery disease history of non-Q-wave myocardial infarction in the past, hypertension, history of recurrent syncope in the past status post loop recorder in the past workup negative so far. GERD, chronic anemia, valvular heart disease, degenerative joint disease, history of questionable seizure disorder, dementia, history of small bowel obstruction status post exploratory laparotomy/obturator hernia repair in the past, came to the ER by EMS following syncopal episode. Patient states she was in the park with the family around 1:30 PM had sudden onset of syncopal episode per family patient did not eat anything earlier this morning patient denies any history of falls seizure activity. Denies any weakness in the arms or legs. Denies any chest pain palpitation prior to syncopal episode patient initially refused to come to the hospital but again had her syncopal episode so decided to come to ER patient was noted initially to be hypotensive with blood pressure in 80s and was also noted to have minimally elevated troponin I. in the ER. Patient recently had a loop recorder intubated which showed no evidence of significant arrhythmias. Patient presently denies any chest pain or shortness of breath. Patient also complained of earlier vague abdominal pain associated with nausea and vomiting. Patient denies any urinary complaints denies any fever or chills.  Past Medical History  Diagnosis Date  . Syncope   . Unspecified cardiac device in situ     Loop recorder  . Atypical chest pain     Nonobstructive catheterization; negative Myoview 2011  . CVA 11/23/2008  . SEIZURE DISORDER 11/23/2008  . DEMENTIA 11/23/2008  . HYPERTENSION 11/23/2008  . Coronary artery disease   . 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 Heart attack   . Other primary  cardiomyopathies     Past Surgical History  Procedure Laterality Date  . Loop recorder insertion    . Colonoscopy    . Eye surgery    . Cataract extraction, bilateral    . Laparotomy  11/30/2011    Procedure: EXPLORATORY LAPAROTOMY;  Surgeon: Liz Malady, MD;  Location: [facility] OR;  Service: General;  Laterality: N/A;  Exploratory Laparotomy with Primary Repair Obturator Hernia    Family History  Problem Relation Age of Onset  . Heart failure Neg Hx   . Stroke    . Heart disease     Social History:  reports that she has quit smoking. She has never used smokeless tobacco. She reports that she does not drink alcohol or use illicit drugs.  Allergies:  Allergies  Allergen Reactions  . Codeine Other (See Comments)    UNKNOWN; Per MAR     (Not in a hospital admission)  Results for orders placed during the hospital encounter of 01/20/13 (from the past 48 hour(s))  CBC WITH DIFFERENTIAL     Status: Abnormal   Collection Time    01/20/13  3:50 PM      Result Value Range   WBC 7.2  4.0 - 10.5 K/uL   RBC 4.49  3.87 - 5.11 MIL/uL   Hemoglobin 9.8 (*) 12.0 - 15.0 g/dL   HCT 29.5 (*) 62.1 - 30.8 %   MCV 66.4 (*) 78.0 - 100.0 fL   MCH 21.8 (*) 26.0 - 34.0 pg   MCHC 32.9  30.0 - 36.0 g/dL   RDW 65.7 (*) 84.6 - 96.2 %   Platelets 224  150 -  400 K/uL   Neutrophils Relative % 77  43 - 77 %   Lymphocytes Relative 15  12 - 46 %   Monocytes Relative 7  3 - 12 %   Eosinophils Relative 1  0 - 5 %   Basophils Relative 0  0 - 1 %   Neutro Abs 5.5  1.7 - 7.7 K/uL   Lymphs Abs 1.1  0.7 - 4.0 K/uL   Monocytes Absolute 0.5  0.1 - 1.0 K/uL   Eosinophils Absolute 0.1  0.0 - 0.7 K/uL   Basophils Absolute 0.0  0.0 - 0.1 K/uL   RBC Morphology TARGET CELLS     Comment: ACANTHOCYTES     ELLIPTOCYTES     TEARDROP CELLS  COMPREHENSIVE METABOLIC PANEL     Status: Abnormal   Collection Time    01/20/13  3:50 PM      Result Value Range   Sodium 140  135 - 145 mEq/L   Potassium 4.3  3.5 - 5.1  mEq/L   Chloride 109  96 - 112 mEq/L   CO2 22  19 - 32 mEq/L   Glucose, Bld 104 (*) 70 - 99 mg/dL   BUN 34 (*) 6 - 23 mg/dL   Creatinine, Ser 2.13  0.50 - 1.10 mg/dL   Calcium 8.3 (*) 8.4 - 10.5 mg/dL   Total Protein 7.5  6.0 - 8.3 g/dL   Albumin 3.2 (*) 3.5 - 5.2 g/dL   AST 33  0 - 37 U/L   ALT 12  0 - 35 U/L   Alkaline Phosphatase 66  39 - 117 U/L   Total Bilirubin 0.4  0.3 - 1.2 mg/dL   GFR calc non Af Amer 56 (*) >90 mL/min   GFR calc Af Amer 65 (*) >90 mL/min   Comment: (NOTE)     The eGFR has been calculated using the CKD EPI equation.     This calculation has not been validated in all clinical situations.     eGFR's persistently <90 mL/min signify possible Chronic Kidney     Disease.  POCT I-STAT TROPONIN I     Status: Abnormal   Collection Time    01/20/13  4:13 PM      Result Value Range   Troponin i, poc 0.55 (*) 0.00 - 0.08 ng/mL   Comment 3            Comment: Due to the release kinetics of cTnI,     a negative result within the first hours     of the onset of symptoms does not rule out     myocardial infarction with certainty.     If myocardial infarction is still suspected,     repeat the test at appropriate intervals.  URINALYSIS, ROUTINE W REFLEX MICROSCOPIC     Status: Abnormal   Collection Time    01/20/13  4:14 PM      Result Value Range   Color, Urine YELLOW  YELLOW   APPearance CLOUDY (*) CLEAR   Specific Gravity, Urine 1.012  1.005 - 1.030   pH 5.0  5.0 - 8.0   Glucose, UA NEGATIVE  NEGATIVE mg/dL   Hgb urine dipstick SMALL (*) NEGATIVE   Bilirubin Urine NEGATIVE  NEGATIVE   Ketones, ur NEGATIVE  NEGATIVE mg/dL   Protein, ur NEGATIVE  NEGATIVE mg/dL   Urobilinogen, UA 1.0  0.0 - 1.0 mg/dL   Nitrite NEGATIVE  NEGATIVE   Leukocytes, UA MODERATE (*) NEGATIVE  URINE MICROSCOPIC-ADD  ON     Status: Abnormal   Collection Time    01/20/13  4:14 PM      Result Value Range   Squamous Epithelial / LPF FEW (*) RARE   WBC, UA 3-6  <3 WBC/hpf   RBC / HPF  3-6  <3 RBC/hpf   Bacteria, UA FEW (*) RARE   Urine-Other AMORPHOUS URATES/PHOSPHATES    POCT I-STAT, CHEM 8     Status: Abnormal   Collection Time    01/20/13  4:15 PM      Result Value Range   Sodium 144  135 - 145 mEq/L   Potassium 4.3  3.5 - 5.1 mEq/L   Chloride 111  96 - 112 mEq/L   BUN 35 (*) 6 - 23 mg/dL   Creatinine, Ser 4.09  0.50 - 1.10 mg/dL   Glucose, Bld 811 (*) 70 - 99 mg/dL   Calcium, Ion 9.14  7.82 - 1.30 mmol/L   TCO2 23  0 - 100 mmol/L   Hemoglobin 11.2 (*) 12.0 - 15.0 g/dL   HCT 95.6 (*) 21.3 - 08.6 %  CG4 I-STAT (LACTIC ACID)     Status: None   Collection Time    01/20/13  4:15 PM      Result Value Range   Lactic Acid, Venous 1.54  0.5 - 2.2 mmol/L   Dg Chest 2 View  01/20/2013   *RADIOLOGY REPORT*  Clinical Data: Chest pain.  Loss of consciousness.  CHEST - 2 VIEW  Comparison: Two-view chest x-ray 12/14/2011, 12/10/2011, 11/25/2011, 09/20/2010.  Findings: Cardiac silhouette mildly to moderately enlarged for the AP technique, unchanged.  Thoracic aorta tortuous atherosclerotic, unchanged.  Hilar and mediastinal contours otherwise unremarkable. Chronic diffuse interstitial lung disease, unchanged.  Chronic elevation of the right hemidiaphragm with chronic scar/atelectasis in the right lower lobe, unchanged.  No new or superimposed pulmonary abnormalities.  Cardiac monitoring device attached to the chest wall.  Visualized bony thorax intact with osseous demineralization.  IMPRESSION: No acute cardiopulmonary disease.  Stable mild to moderate cardiomegaly and stable chronic diffuse interstitial lung disease.   Original Report Authenticated By: Hulan Saas, M.D.    Review of Systems  Constitutional: Negative for fever and chills.  Eyes: Negative for blurred vision, double vision and photophobia.  Cardiovascular: Negative for chest pain, palpitations, orthopnea and claudication.  Gastrointestinal: Positive for nausea, vomiting and abdominal pain.  Genitourinary:  Negative for dysuria and urgency.  Neurological: Negative for dizziness and headaches.    Blood pressure 126/77, pulse 86, temperature 97.7 F (36.5 C), temperature source Rectal, resp. rate 22, height 4\' 11"  (1.499 m), weight 49.896 kg (110 lb), SpO2 92.00%. Physical Exam  Constitutional: She is oriented to person, place, and time.  HENT:  Head: Normocephalic and atraumatic.  Eyes: Conjunctivae are normal. Left eye exhibits no discharge. No scleral icterus.  Neck: Normal range of motion. Neck supple. No JVD present. No tracheal deviation present. No thyromegaly present.  Cardiovascular: Normal rate and regular rhythm.   Murmur (2/6 systolic murmur noted no S3 gallop) heard. Respiratory:  Decreased breath sound at bases  GI: Soft. Bowel sounds are normal. She exhibits no distension. There is tenderness (mild generalized tenderness no guarding no mass).  Musculoskeletal: She exhibits no edema and no tenderness.  Neurological: She is alert and oriented to person, place, and time.     Assessment/Plan Status post recurrent syncope probably secondary to hypotension secondary to meds/dehydration Minimally elevated troponin I. a secondary to above doubt significant MI  History  of decompensated congestive heart failure secondary to diastolic/systolic dysfunction Valvular heart disease  Prerenal azotemia Hypertension  abdominal pain questionable etiology Dementia Questionable history of seizure disorder History of small bowel obstruction status post exploratory laparotomy/obturator hernia repair in the past Plan As per orders [facility] 01/20/2013, 8:55 PM

## 2013-01-20 NOTE — ED Notes (Signed)
Patient brought in via [facility] EMS, Patient was [place] and had two syncopal episodes while sitting , No PO intake today CBG 149. Per EMS patient had nausea and vomiting. Monitor shows a right BBB. Iv NS infusing without problems Zofran 4 mgs given in route.

## 2013-01-20 NOTE — ED Provider Notes (Signed)
CSN: [id number]     Arrival date & time 01/20/13  1445 History     None    Chief Complaint  Patient presents with  . Loss of Consciousness    HPI  [name] is a 79 year old female with a PMH of syncope, CVA, seizures, dementia, hypertension, CAD, myocardial infarction, cardiomyopathy, and obturator hernia who presents to the ED for evaluation of LOC.  History provided by the family who is present in the ED.  Patient states that around 1:30 pm she was at a picnic when she suddenly slumped over.  A bystander was able to prevent her from falling.  She was allegedly unconscious for about 5 minutes.  When she returned to baseline, she refused EMS transport who was present on scene.  She then had another episode of LOC and EMS transported the patient to the ED.  She also had several episodes of emesis and loss of bowel function with a large bowel movement.  She is currently A&O x 3 in the ED.  She is complaining of lightheadedness without headache.  She also is complaining of lower abdominal pain without nausea.  She denies any weakness, loss of sensation, numbness/tingling, chest pain, SOB, or extremity pain.  Family states that she did not eat or drink anything before the picnic today.  She also may have not taken her home medications this morning.  She otherwise has been well with no recent fevers, chills, rhinorrhea, or sore throat.     Past Medical History  Diagnosis Date  . Syncope   . Unspecified cardiac device in situ     Loop recorder  . Atypical chest pain     Nonobstructive catheterization; negative Myoview 2011  . CVA 11/23/2008  . SEIZURE DISORDER 11/23/2008  . DEMENTIA 11/23/2008  . HYPERTENSION 11/23/2008  . Coronary artery disease   . 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 Heart attack   . Other primary cardiomyopathies    Past Surgical History  Procedure Laterality Date  . Loop recorder insertion    . Colonoscopy    . Eye surgery    . Cataract extraction,  bilateral    . Laparotomy  11/30/2011    Procedure: EXPLORATORY LAPAROTOMY;  Surgeon: Liz Malady, MD;  Location: [facility] OR;  Service: General;  Laterality: N/A;  Exploratory Laparotomy with Primary Repair Obturator Hernia   Family History  Problem Relation Age of Onset  . Heart failure Neg Hx   . Stroke    . Heart disease     History  Substance Use Topics  . Smoking status: Former Games developer  . Smokeless tobacco: Never Used  . Alcohol Use: No   OB History   Grav Para Term Preterm Abortions TAB SAB Ect Mult Living                 Review of Systems  Constitutional: Negative for fever, chills, activity change, appetite change and fatigue.  HENT: Negative for ear pain, congestion, sore throat, rhinorrhea, neck pain and neck stiffness.   Eyes: Negative for visual disturbance.  Respiratory: Negative for cough and shortness of breath.   Cardiovascular: Negative for chest pain and leg swelling.  Gastrointestinal: Positive for nausea, vomiting, abdominal pain and diarrhea. Negative for constipation, blood in stool and anal bleeding.  Musculoskeletal: Negative for back pain.  Skin: Negative for wound.  Neurological: Positive for syncope and light-headedness. Negative for dizziness, weakness, numbness and headaches.    Allergies  Codeine  Home Medications  Current Outpatient Rx  Name  Route  Sig  Dispense  Refill  . donepezil (ARICEPT) 5 MG tablet   Oral   Take 1 tablet by mouth daily.         . feeding supplement (ENSURE COMPLETE) LIQD   Oral   Take 237 mLs by mouth 2 (two) times daily between meals.   60 Bottle   1   . EXPIRED: furosemide (LASIX) 40 MG tablet   Oral   Take 1 tablet (40 mg total) by mouth daily.   30 tablet   3   . lisinopril (PRINIVIL,ZESTRIL) 5 MG tablet   Oral   Take 2.5 mg by mouth daily.         . Multiple Vitamin (MULTIVITAMIN WITH MINERALS) TABS   Oral   Take 1 tablet by mouth daily.         Marland Kitchen omeprazole (PRILOSEC) 20 MG capsule    Oral   Take 20 mg by mouth daily.         . polyethylene glycol (MIRALAX / GLYCOLAX) packet   Oral   Take 17 g by mouth daily.           Marland Kitchen EXPIRED: potassium chloride SA (K-DUR,KLOR-CON) 20 MEQ tablet   Oral   Take 1 tablet (20 mEq total) by mouth daily.   30 tablet   3   . simvastatin (ZOCOR) 20 MG tablet   Oral   Take 20 mg by mouth every evening.          BP 86/47  Temp(Src) 97.6 F (36.4 C) (Oral)  Ht 4\' 11"  (1.499 m)  Wt 110 lb (49.896 kg)  BMI 22.21 kg/m2  SpO2 96%  Filed Vitals:   01/20/13 1645 01/20/13 1745 01/20/13 1930 01/20/13 2115  BP: 116/86 122/68 126/77 109/54  Pulse: 113 78 86 81  Temp:      TempSrc:      Resp: 19 12 22    Height:      Weight:      SpO2: 98% 90% 92% 93%    Physical Exam  Nursing note and vitals reviewed. Constitutional: She is oriented to person, place, and time. She appears well-developed and well-nourished. No distress.  Thin elderly female   HENT:  Head: Normocephalic and atraumatic.  Right Ear: External ear normal.  Left Ear: External ear normal.  Nose: Nose normal.  Mouth/Throat: Oropharynx is clear and moist. No oropharyngeal exudate.  TM's gray and translucent bilaterally  Eyes: Conjunctivae and EOM are normal. Pupils are equal, round, and reactive to light. Right eye exhibits no discharge. Left eye exhibits no discharge.  Neck: Normal range of motion. Neck supple.  Cardiovascular: Normal rate, regular rhythm, normal heart sounds and intact distal pulses.  Exam reveals no gallop and no friction rub.   No murmur heard. Dorsalis pedis pulses present bilaterally  Pulmonary/Chest: Effort normal and breath sounds normal. No respiratory distress. She has no wheezes. She has no rales. She exhibits no tenderness.  Abdominal: Soft. Bowel sounds are normal. She exhibits no distension and no mass. There is tenderness. There is no rebound and no guarding.  Diffuse tenderness to palpation to the lower abdomen throughout.     Genitourinary:  Patient incontinent of stool.  No gross blood from the rectal vault.  Tan brown liquid stool present.   Musculoskeletal: Normal range of motion. She exhibits no edema and no tenderness.  Neurological: She is alert and oriented to person, place, and time.  GCS 15.  No focal neurological deficits.  No cranial nerve deficits.  Gross sensation intact in the upper and lower extremities bilaterally.    Skin: Skin is warm and dry. She is not diaphoretic.     ED Course   Procedures (including critical care time)  Labs Reviewed - No data to display No results found. No diagnosis found.  Results for orders placed during the hospital encounter of 01/20/13  CBC WITH DIFFERENTIAL      Result Value Range   WBC 7.2  4.0 - 10.5 K/uL   RBC 4.49  3.87 - 5.11 MIL/uL   Hemoglobin 9.8 (*) 12.0 - 15.0 g/dL   HCT 30.8 (*) 65.7 - 84.6 %   MCV 66.4 (*) 78.0 - 100.0 fL   MCH 21.8 (*) 26.0 - 34.0 pg   MCHC 32.9  30.0 - 36.0 g/dL   RDW 96.2 (*) 95.2 - 84.1 %   Platelets 224  150 - 400 K/uL   Neutrophils Relative % 77  43 - 77 %   Lymphocytes Relative 15  12 - 46 %   Monocytes Relative 7  3 - 12 %   Eosinophils Relative 1  0 - 5 %   Basophils Relative 0  0 - 1 %   Neutro Abs 5.5  1.7 - 7.7 K/uL   Lymphs Abs 1.1  0.7 - 4.0 K/uL   Monocytes Absolute 0.5  0.1 - 1.0 K/uL   Eosinophils Absolute 0.1  0.0 - 0.7 K/uL   Basophils Absolute 0.0  0.0 - 0.1 K/uL   RBC Morphology TARGET CELLS    COMPREHENSIVE METABOLIC PANEL      Result Value Range   Sodium 140  135 - 145 mEq/L   Potassium 4.3  3.5 - 5.1 mEq/L   Chloride 109  96 - 112 mEq/L   CO2 22  19 - 32 mEq/L   Glucose, Bld 104 (*) 70 - 99 mg/dL   BUN 34 (*) 6 - 23 mg/dL   Creatinine, Ser 3.24  0.50 - 1.10 mg/dL   Calcium 8.3 (*) 8.4 - 10.5 mg/dL   Total Protein 7.5  6.0 - 8.3 g/dL   Albumin 3.2 (*) 3.5 - 5.2 g/dL   AST 33  0 - 37 U/L   ALT 12  0 - 35 U/L   Alkaline Phosphatase 66  39 - 117 U/L   Total Bilirubin 0.4  0.3 - 1.2  mg/dL   GFR calc non Af Amer 56 (*) >90 mL/min   GFR calc Af Amer 65 (*) >90 mL/min  URINALYSIS, ROUTINE W REFLEX MICROSCOPIC      Result Value Range   Color, Urine YELLOW  YELLOW   APPearance CLOUDY (*) CLEAR   Specific Gravity, Urine 1.012  1.005 - 1.030   pH 5.0  5.0 - 8.0   Glucose, UA NEGATIVE  NEGATIVE mg/dL   Hgb urine dipstick SMALL (*) NEGATIVE   Bilirubin Urine NEGATIVE  NEGATIVE   Ketones, ur NEGATIVE  NEGATIVE mg/dL   Protein, ur NEGATIVE  NEGATIVE mg/dL   Urobilinogen, UA 1.0  0.0 - 1.0 mg/dL   Nitrite NEGATIVE  NEGATIVE   Leukocytes, UA MODERATE (*) NEGATIVE  URINE MICROSCOPIC-ADD ON      Result Value Range   Squamous Epithelial / LPF FEW (*) RARE   WBC, UA 3-6  <3 WBC/hpf   RBC / HPF 3-6  <3 RBC/hpf   Bacteria, UA FEW (*) RARE   Urine-Other AMORPHOUS URATES/PHOSPHATES    POCT I-STAT,  CHEM 8      Result Value Range   Sodium 144  135 - 145 mEq/L   Potassium 4.3  3.5 - 5.1 mEq/L   Chloride 111  96 - 112 mEq/L   BUN 35 (*) 6 - 23 mg/dL   Creatinine, Ser 4.54  0.50 - 1.10 mg/dL   Glucose, Bld 098 (*) 70 - 99 mg/dL   Calcium, Ion 1.19  1.47 - 1.30 mmol/L   TCO2 23  0 - 100 mmol/L   Hemoglobin 11.2 (*) 12.0 - 15.0 g/dL   HCT 82.9 (*) 56.2 - 13.0 %  CG4 I-STAT (LACTIC ACID)      Result Value Range   Lactic Acid, Venous 1.54  0.5 - 2.2 mmol/L  POCT I-STAT TROPONIN I      Result Value Range   Troponin i, poc 0.55 (*) 0.00 - 0.08 ng/mL   Comment 3              Date: 01/20/2013  Rate: 69   Rhythm: normal sinus rhythm  QRS Axis: normal  Intervals: QT prolonged 542  ST/T Wave abnormalities: normal  Conduction Disutrbances:right bundle branch block  Narrative Interpretation:   Old EKG Reviewed: unchanged 03/24/2012    DG Chest 2 View (Final result)  Result time: 01/20/13 18:25:55    Final result by Rad Results In Interface (01/20/13 18:25:55)    Narrative:   *RADIOLOGY REPORT*  Clinical Data: Chest pain. Loss of consciousness.  CHEST - 2  VIEW  Comparison: Two-view chest x-ray 12/14/2011, 12/10/2011, 11/25/2011, 09/20/2010.  Findings: Cardiac silhouette mildly to moderately enlarged for the AP technique, unchanged. Thoracic aorta tortuous atherosclerotic, unchanged. Hilar and mediastinal contours otherwise unremarkable. Chronic diffuse interstitial lung disease, unchanged. Chronic elevation of the right hemidiaphragm with chronic scar/atelectasis in the right lower lobe, unchanged. No new or superimposed pulmonary abnormalities. Cardiac monitoring device attached to the chest wall. Visualized bony thorax intact with osseous demineralization.  IMPRESSION: No acute cardiopulmonary disease. Stable mild to moderate cardiomegaly and stable chronic diffuse interstitial lung disease.   Original Report Authenticated By: Hulan Saas, M.D.    CT Abdomen Pelvis W Contrast (Final result)  Result time: 01/20/13 21:01:45    Final result by Rad Results In Interface (01/20/13 21:01:45)    Narrative:   *RADIOLOGY REPORT*  Clinical Data: Loss of consciousness.  CT ABDOMEN AND PELVIS WITH CONTRAST  Technique: Multidetector CT imaging of the abdomen and pelvis was performed following the standard protocol during bolus administration of intravenous contrast.  Contrast: 80mL OMNIPAQUE IOHEXOL 300 MG/ML SOLN  Comparison: 01/12/2012 and 11/30/2011  Findings: Lung bases demonstrate mild peripheral increased interstitial markings and subtle bibasilar dependent atelectatic change. There is a metallic device of the left lower anterior chest wall. There is mild cardiomegaly with calcification over the left anterior descending coronary artery and minimal calcification at the mitral valve annulus.  Abdominal images demonstrate a normal liver, spleen, gallbladder and pancreas. There is no change in a 1.8 cm right adrenal mass and 3 cm well defined ovoid left adrenal mass likely lipid poor adenomas. There is a moderate size  duodenal diverticulum over the C sweep containing an air contrast level. There is diverticulosis of the colon. Appendix is not seen. Kidneys are normal in size without hydronephrosis.  Pelvic images demonstrate mild fecal retention of the rectosigmoid colon. Remainder of the exam is unchanged.  IMPRESSION: No acute findings in the abdomen/pelvis.  Diverticulosis of the colon without active inflammation.  Moderate size duodenal diverticulum.  Cardiomegaly  with atherosclerotic coronary artery disease and calcification of the mitral valve annulus.   Original Report Authenticated By: Elberta Fortis, M.D.     MDM  Estell Harpin Hines is a 79 year old female with a PMH of syncope, CVA, seizures, dementia, hypertension, CAD, myocardial infarction, cardiomyopathy, and obturator hernia who presents to the ED for evaluation of LOC.  1L IV fluids ordered for hypotension.  EKG and troponin ordered to evaluate for cardiac causes of syncope.  CBC, CMP, Occult blood stool, I stat 8, and UA ordered to evaluate abdominal pain and syncope.  Abdomen with chest x-ray ordered to evaluate abdominal pain.     Rechecks  3:50 PM = Bedside FAST US performed by Dr. Gwendolyn Grant.  Aorta approximately 1.8 cm in diameter.  No obvious cardiac wall motion abnormalities present - adequate cardiac contractility.   4:10 PM = Patient has to use the restroom.  Obtaining urine sample.  Resting with family in no acute distress.  BP improving.   4:30 PM = Patient resting comfortably on her side.  Feels a little nauseated.  Still continues to experience abdominal pain, however, it is manageable at this time.   6:01 PM = Patient states she is "doing fine."  Going for CT scan.   7:18 PM = Patient resting comfortably.  Abdominal pain improved.  No nausea.     Consults  7:24 PM = Spoke with Dr. Carley Hammed who is coming to evaluate her in the ED.   8:53 PM = Patient will be admitted for observation under Dr. Carley Hammed.     Etiology of  syncope possibly due to hypotension secondary to decreased food and fluid intake.  Cardiac causes including MI are also possible given elevated troponin.  Patient also has a history of syncope.  EKG unchanged from previous with no acute ischemic changes and patient had no complaints of chest pain.  Patient BP responded well to IV fluids and she is currently normotensive and stable.  Her abdominal pain improved throughout her ED visit and her CT was negative for an acute intra-abdominal abnormality.  She was found to have diverticulosis without diverticulitis on CT scan.  Her occult stool was negative.  Her anemia appears to be around her baseline.  Patient may have a UTI.  Urine sent for culture.  She was admitted for observation.  She was in agreement with admission and plan.     Final impressions: 1. Syncope  2. Elevated troponin 3. Abdominal pain  4. Diverticulosis     Greer Ee Evamarie Raetz PA-C   Jillyn Ledger, PA-C 01/20/13 2134

## 2013-01-20 NOTE — ED Provider Notes (Signed)
Medical screening examination/treatment/procedure(s) were conducted as a shared visit with non-physician practitioner(s) and myself.  I personally evaluated the patient during the encounter. Patient with a syncopal episode. Positive troponin but no chest pain. Likely secondary to hypotension. Blood pressures improved with IV fluids. Patient be admitted by Dr. Sharyn Lull.  CRITICAL CARE Performed by: Billee Cashing Total critical care time: 30 Critical care time was exclusive of separately billable procedures and treating other patients. Critical care was necessary to treat or prevent imminent or life-threatening deterioration. Critical care was time spent personally by me on the following activities: development of treatment plan with patient and/or surrogate as well as nursing, discussions with consultants, evaluation of patient's response to treatment, examination of patient, obtaining history from patient or surrogate, ordering and performing treatments and interventions, ordering and review of laboratory studies, ordering and review of radiographic studies, pulse oximetry and re-evaluation of patient's condition.  Juliet Rude. Rubin Payor, MD 01/20/13 [phone]

## 2013-01-21 DIAGNOSIS — R55 Syncope and collapse: Secondary | ICD-10-CM | POA: Diagnosis not present

## 2013-01-21 LAB — BASIC METABOLIC PANEL
BUN: 28 mg/dL — ABNORMAL HIGH (ref 6–23)
Chloride: 105 mEq/L (ref 96–112)
Creatinine, Ser: 0.86 mg/dL (ref 0.50–1.10)
GFR calc Af Amer: 71 mL/min — ABNORMAL LOW (ref >90)
GFR calc non Af Amer: 62 mL/min — ABNORMAL LOW (ref >90)
Glucose, Bld: 93 mg/dL (ref 70–99)
Potassium: 4.1 mEq/L (ref 3.5–5.1)

## 2013-01-21 LAB — CBC
HCT: 26.4 % — ABNORMAL LOW (ref 36.0–46.0)
Hemoglobin: 8.5 g/dL — ABNORMAL LOW (ref 12.0–15.0)
MCHC: 32.2 g/dL (ref 30.0–36.0)
MCV: 66.2 fL — ABNORMAL LOW (ref 78.0–100.0)
RDW: 18.2 % — ABNORMAL HIGH (ref 11.5–15.5)

## 2013-01-21 LAB — TSH: TSH: 1.806 u[IU]/mL (ref 0.350–4.500)

## 2013-01-21 LAB — LIPID PANEL
HDL: 47 mg/dL (ref >39)
LDL Cholesterol: 50 mg/dL (ref 0–99)
Total CHOL/HDL Ratio: 2.4 RATIO

## 2013-01-21 LAB — TROPONIN I: Troponin I: 0.55 ng/mL (ref <0.30)

## 2013-01-21 LAB — CBC WITH DIFFERENTIAL/PLATELET
Basophils Absolute: 0 10*3/uL (ref 0.0–0.1)
Basophils Relative: 0 % (ref 0–1)
Eosinophils Relative: 0 % (ref 0–5)
HCT: 30.3 % — ABNORMAL LOW (ref 36.0–46.0)
Hemoglobin: 10.2 g/dL — ABNORMAL LOW (ref 12.0–15.0)
Lymphocytes Relative: 17 % (ref 12–46)
Lymphs Abs: 1.7 10*3/uL (ref 0.7–4.0)
MCV: 66.3 fL — ABNORMAL LOW (ref 78.0–100.0)
Monocytes Relative: 6 % (ref 3–12)
Neutro Abs: 7.7 10*3/uL (ref 1.7–7.7)
RDW: 18 % — ABNORMAL HIGH (ref 11.5–15.5)
WBC: 10 10*3/uL (ref 4.0–10.5)

## 2013-01-21 MED ORDER — SENNOSIDES-DOCUSATE SODIUM 8.6-50 MG PO TABS
1.0000 | ORAL_TABLET | Freq: Two times a day (BID) | ORAL | Status: DC | PRN
  Filled 2013-01-21: qty 1

## 2013-01-21 MED ORDER — FERROUS SULFATE 325 (65 FE) MG PO TABS
325.0000 mg | ORAL_TABLET | Freq: Three times a day (TID) | ORAL | Status: DC
  Administered 2013-01-21 – 2013-01-22 (×3): 325 mg via ORAL
  Filled 2013-01-21 (×5): qty 1

## 2013-01-21 NOTE — Progress Notes (Signed)
Dr Sharyn Lull notified of trop of .66 no new orders.

## 2013-01-21 NOTE — Progress Notes (Signed)
Dr.Harwani notified or Trop level .38. No new orders obtained.

## 2013-01-21 NOTE — Progress Notes (Signed)
Utilization review completed.  

## 2013-01-21 NOTE — Progress Notes (Signed)
Dr Sharyn Lull on floor and made aware of patients blood pressure of 93/55. Patient asymptomatic with low BP and has no complaints. MD to put orders in computer regarding low BP. Will hold Metoprolol dose this AM per Dr. Sharyn Lull

## 2013-01-21 NOTE — Progress Notes (Signed)
Subjective:  Patient denies any chest pain or shortness of breath. No further episodes of syncope. Pressure remains on lower side. Troponin remains mildly elevated patient had cardiac cath last year and showed mild coronary artery disease had similar presentation.  Objective:  Vital Signs in the last 24 hours: Temp:  [97.1 F (36.2 C)-99 F (37.2 C)] 97.1 F (36.2 C) (08/24 0645) Pulse Rate:  [62-113] 62 (08/24 0949) Resp:  [12-22] 18 (08/24 0645) BP: (86-136)/(42-86) 93/55 mmHg (08/24 0949) SpO2:  [90 %-98 %] 95 % (08/24 0949) Weight:  [49.76 kg (109 lb 11.2 oz)-50.032 kg (110 lb 4.8 oz)] 50.032 kg (110 lb 4.8 oz) (08/24 0645)  Intake/Output from previous day: 08/23 0701 - 08/24 0700 In: -  Out: 350 [Urine:350] Intake/Output from this shift:    Physical Exam: Neck: no adenopathy, no carotid bruit, no JVD and supple, symmetrical, trachea midline Lungs: clear to auscultation bilaterally Heart: regular rate and rhythm, S1, S2 normal and 2/6 systolic murmur noted no S3 gallop Abdomen: soft, non-tender; bowel sounds normal; no masses,  no organomegaly Extremities: extremities normal, atraumatic, no cyanosis or edema  Lab Results:  Recent Labs  01/20/13 2300 01/21/13 0400  WBC 10.0 9.1  HGB 10.2* 8.5*  PLT 190 183    Recent Labs  01/20/13 2300 01/21/13 0400  NA 133* 135  K 4.2 4.1  CL 103 105  CO2 19 22  GLUCOSE 85 93  BUN 27* 28*  CREATININE 0.78 0.86    Recent Labs  01/20/13 2300 01/21/13 0425  TROPONINI 0.38* 0.66*   Hepatic Function Panel  Recent Labs  01/20/13 2300  PROT 7.6  ALBUMIN 3.3*  AST 39*  ALT 14  ALKPHOS 67  BILITOT 0.5    Recent Labs  01/21/13 0400  CHOL 115   No results found for this basename: PROTIME,  in the last 72 hours  Imaging: Imaging results have been reviewed and Dg Chest 2 View  01/20/2013   *RADIOLOGY REPORT*  Clinical Data: Chest pain.  Loss of consciousness.  CHEST - 2 VIEW  Comparison: Two-view chest x-ray  12/14/2011, 12/10/2011, 11/25/2011, 09/20/2010.  Findings: Cardiac silhouette mildly to moderately enlarged for the AP technique, unchanged.  Thoracic aorta tortuous atherosclerotic, unchanged.  Hilar and mediastinal contours otherwise unremarkable. Chronic diffuse interstitial lung disease, unchanged.  Chronic elevation of the right hemidiaphragm with chronic scar/atelectasis in the right lower lobe, unchanged.  No new or superimposed pulmonary abnormalities.  Cardiac monitoring device attached to the chest wall.  Visualized bony thorax intact with osseous demineralization.  IMPRESSION: No acute cardiopulmonary disease.  Stable mild to moderate cardiomegaly and stable chronic diffuse interstitial lung disease.   Original Report Authenticated By: Hulan Saas, M.D.   Ct Abdomen Pelvis W Contrast  01/20/2013   *RADIOLOGY REPORT*  Clinical Data: Loss of consciousness.  CT ABDOMEN AND PELVIS WITH CONTRAST  Technique:  Multidetector CT imaging of the abdomen and pelvis was performed following the standard protocol during bolus administration of intravenous contrast.  Contrast: 80mL OMNIPAQUE IOHEXOL 300 MG/ML  SOLN  Comparison: 01/12/2012 and 11/30/2011  Findings: Lung bases demonstrate mild peripheral increased interstitial markings and subtle bibasilar dependent atelectatic change.  There is a metallic device of the left lower anterior chest wall.  There is mild cardiomegaly with calcification over the left anterior descending coronary artery and minimal calcification at the mitral valve annulus.  Abdominal images demonstrate a normal liver, spleen, gallbladder and pancreas.  There is no change in a 1.8 cm right adrenal  mass and 3 cm well defined ovoid left adrenal mass likely lipid poor adenomas.  There is a moderate size duodenal diverticulum over the C sweep containing an air contrast level.  There is diverticulosis of the colon.  Appendix is not seen.  Kidneys are normal in size without hydronephrosis.   Pelvic images demonstrate mild fecal retention of the rectosigmoid colon.  Remainder of the exam is unchanged.  IMPRESSION: No acute findings in the abdomen/pelvis.  Diverticulosis of the colon without active inflammation.  Moderate size duodenal diverticulum.  Cardiomegaly with atherosclerotic coronary artery disease and calcification of the mitral valve annulus.   Original Report Authenticated By: Elberta Fortis, M.D.    Cardiac Studies:  Assessment/Plan:  Status post recurrent syncope probably secondary to hypotension secondary to meds/dehydration  Minimally elevated troponin I. a secondary to above doubt significant MI  History of decompensated congestive heart failure secondary to diastolic/systolic dysfunction  Valvular heart disease  Prerenal azotemia  Acute on chronic anemia probably secondary to hydration rule out GI loss Hypertension  abdominal pain questionable etiology  Dementia  Questionable history of seizure disorder  History of small bowel obstruction status post exploratory laparotomy/obturator hernia repair in the past  Plan Check labs in a.m. Check stool for occult blood Start Feosol DC Lopressor for now in view of hypotension  LOS: 1 day    Kerrie Latour N 01/21/2013, 11:01 AM

## 2013-01-22 DIAGNOSIS — R55 Syncope and collapse: Secondary | ICD-10-CM | POA: Diagnosis not present

## 2013-01-22 LAB — CBC
Hemoglobin: 8.8 g/dL — ABNORMAL LOW (ref 12.0–15.0)
MCH: 21.6 pg — ABNORMAL LOW (ref 26.0–34.0)
MCHC: 32.7 g/dL (ref 30.0–36.0)
Platelets: 195 10*3/uL (ref 150–400)

## 2013-01-22 LAB — BASIC METABOLIC PANEL
Calcium: 8.5 mg/dL (ref 8.4–10.5)
GFR calc Af Amer: 78 mL/min — ABNORMAL LOW (ref >90)
GFR calc non Af Amer: 67 mL/min — ABNORMAL LOW (ref >90)
Potassium: 4 mEq/L (ref 3.5–5.1)
Sodium: 137 mEq/L (ref 135–145)

## 2013-01-22 LAB — URINE CULTURE

## 2013-01-22 LAB — OCCULT BLOOD, POC DEVICE: Fecal Occult Bld: NEGATIVE

## 2013-01-22 LAB — TROPONIN I: Troponin I: 0.68 ng/mL (ref <0.30)

## 2013-01-22 MED ORDER — ASPIRIN 81 MG PO TBEC: 81.0000 mg | DELAYED_RELEASE_TABLET | Freq: Every day | ORAL | Status: AC

## 2013-01-22 NOTE — Discharge Summary (Signed)
  Discharge summary dictated on 01/22/2013 dictation number is [phone]

## 2013-01-22 NOTE — Progress Notes (Signed)
Patient complains of intermittent headaches. Tylenol 650 given with relief.

## 2013-01-23 NOTE — Discharge Summary (Signed)
[name], [name] NO.:  [id number]  MEDICAL RECORD NO.:  [id number]  LOCATION:  4E29C                        FACILITY:  [facility]  PHYSICIAN:  Eduardo Osier. Sharyn Lull, M.D. DATE OF BIRTH:  1929-08-06  DATE OF ADMISSION:  01/20/2013 DATE OF DISCHARGE:  01/22/2013                              DISCHARGE SUMMARY   ADMITTING DIAGNOSES: 1. Status post recurrent syncope probably secondary to hypotension     secondary to medications/dehydration. 2. Minimally elevated troponin I, secondary to above doubt significant     myocardial infarction.  The patient had similar presentation in the     past. 3. History of decompensated congestive heart failure secondary to     diastolic/systolic dysfunction. 4. Valvular heart disease. 5. Prerenal azotemia. 6. Hypertension. 7. Abdominal pain, questionable etiology. 8. Dementia. 9. Questionable history of seizure disorder in the past. 10.History of small bowel obstruction, status post exploratory     laparotomy/obturator hernia repair in the past.  FINAL DIAGNOSES: 1. Status post syncope, probably secondary to hypotension/secondary to     medications/dehydration, status post prerenal azotemia. 2. Minimally elevated troponin-I secondary to doubt significant     myocardial infarction, compensated diastolic/systolic heart     failure, valvular heart disease. 3. Hypertension. 4. Gastroesophageal reflux disease. 5. Dementia. 6. History of questionable seizure disorder. 7. History of small bowel obstruction, status post exploratory     laparotomy/obturator hernia repair in the past. 8. Anemia of chronic disease, stable. 9. Severe malnutrition.  DISCHARGE HOME MEDICATIONS: 1. Enteric-coated aspirin 81 mg 1 tablet daily. 2. Aricept 5 mg 1 tablet daily. 3. Feeding supplement 237 mL twice daily as before. 4. The patient has been advised to stop Lasix. 5. Lisinopril 2.5 mg daily. 6. Multivitamin with mineral 1 tablet daily. 7.  Omeprazole 20 mg daily. 8. Polyethylene glycol as needed. 9. Simvastatin 20 mg daily. 10.The patient has been also advised to stop K-Dur.  DIET:  Low salt, low cholesterol.  ACTIVITY:  Increase activity slowly as tolerated.  The patient has been advised to avoid heat and drink plenty of fluids.  CONDITION AT DISCHARGE:  Stable.  Follow up with me in 1 week.  BRIEF HISTORY AND HOSPITAL COURSE:  [name] is an 79 year old female with past medical history significant for coronary artery disease, history of non-Q-wave myocardial infarction in the past, noted to have mild coronary artery disease, hypertension, history of recurrent syncope in the past, status post loop recorder in the past, negative so far. Had extensive workup in the past.  Negative for any correctable cause, GERD, chronic anemia, valvular heart disease, degenerative joint disease, history of questionable seizure disorder, dementia, history of small bowel obstruction, status post exploratory laparotomy/obturator hernia repair in the past.  She came to the ER via EMS following syncopal episode.  The patient states that she was in the park with the family around 1:30 p.m., had sudden onset of syncopal episode per family.  The patient did not eat anything earlier this morning.  The patient denies any history of falls, seizure activity.  Denies any weakness in the arms or legs.  Denies any chest pain, palpitation prior to syncopal episode.  The patient initially refused to  come to the hospital, but again had syncopal episode, so decided to come to the ED. The patient was noted initially to be hypotensive with blood pressure in 80s, and was also noted to have minimally elevated troponin-I in the ER. The patient recently had loop recorder interrogated, which showed no evidence of significant arrhythmias.  The patient presently denies any chest pain, shortness of breath.  The patient also complained of earlier vague  abdominal pain associated with nausea, vomiting.  The patient denies any urinary complaints.  Denies any fever or chills.  PAST MEDICAL HISTORY:  As above.  PAST SURGICAL HISTORY:  She had loop recorder placed in the past.  Had colonoscopy in the past.  Had eye surgery in the past with cataract extraction, had laparotomy and obturator hernia repair in the past.  PHYSICAL EXAMINATION:  GENERAL:  She was alert, awake, oriented x3, in no acute distress. VITAL SIGNS:  Blood pressure was 126/77, pulse was 86.  She was afebrile.  Conjunctivae was pink. NECK:  Supple.  No JVD.  No bruit. LUNGS:  Decreased breath sounds at bases. CARDIOVASCULAR:  S1, S2 was normal.  There was 2/6 systolic murmur.  No S3 gallop was noted. ABDOMEN:  Soft.  Bowel sounds were present.  Nontender. EXTREMITIES:  There was mild tenderness in the epigastric region.  No mass. EXTREMITIES:  There is no clubbing, cyanosis, or edema.  LABORATORY DATA:  Her sodium was 140, potassium 4.3, BUN 34, creatinine 0.94.  Repeat electrolytes today; potassium is 4.0, BUN 23, creatinine 0.80.  Troponin-I first set was 0.55, next set 0.38, 0.66, 0.55, and 0.68.  Cholesterol was 115, triglycerides were 92, HDL 47, LDL was 50. Hemoglobin was 9.8, hematocrit 29.8, white count of 7.2.  Repeat hemoglobin was 8.5, hematocrit 26.4.  Repeat hemoglobin today is 8.8, hematocrit 26.9, white count of 6.0.  EKG showed normal sinus rhythm with right bundle-branch block.  No acute new ischemic changes were noted.  BRIEF HOSPITAL COURSE:  The patient was admitted to telemetry unit.  The patient did not have any episodes of cardiac arrhythmias or syncopal episode during the hospital stay.  Her blood pressure medicines were held and diuretics and potassium chloride were held also.  The patient's phase 1 cardiac rehab was called.  The patient has been ambulating in room without any problems.  The patient is eager to go home, and then will  be discharged home on above medications and will be followed up in my office in 1 week.     Eduardo Osier. Sharyn Lull, M.D.     [facility]  D:  01/22/2013  T:  01/22/2013  Job:  [id number]

## 2013-04-24 IMAGING — CR DG ABDOMEN ACUTE W/ 1V CHEST
3 series · 3 of 3 positions shown · non-contrast
Comparison: 11/30/2011

CLINICAL DATA: Follow up small bowel obstruction.

ACUTE ABDOMEN SERIES (ABDOMEN 2 VIEW & CHEST 1 VIEW)

[x chest ap]
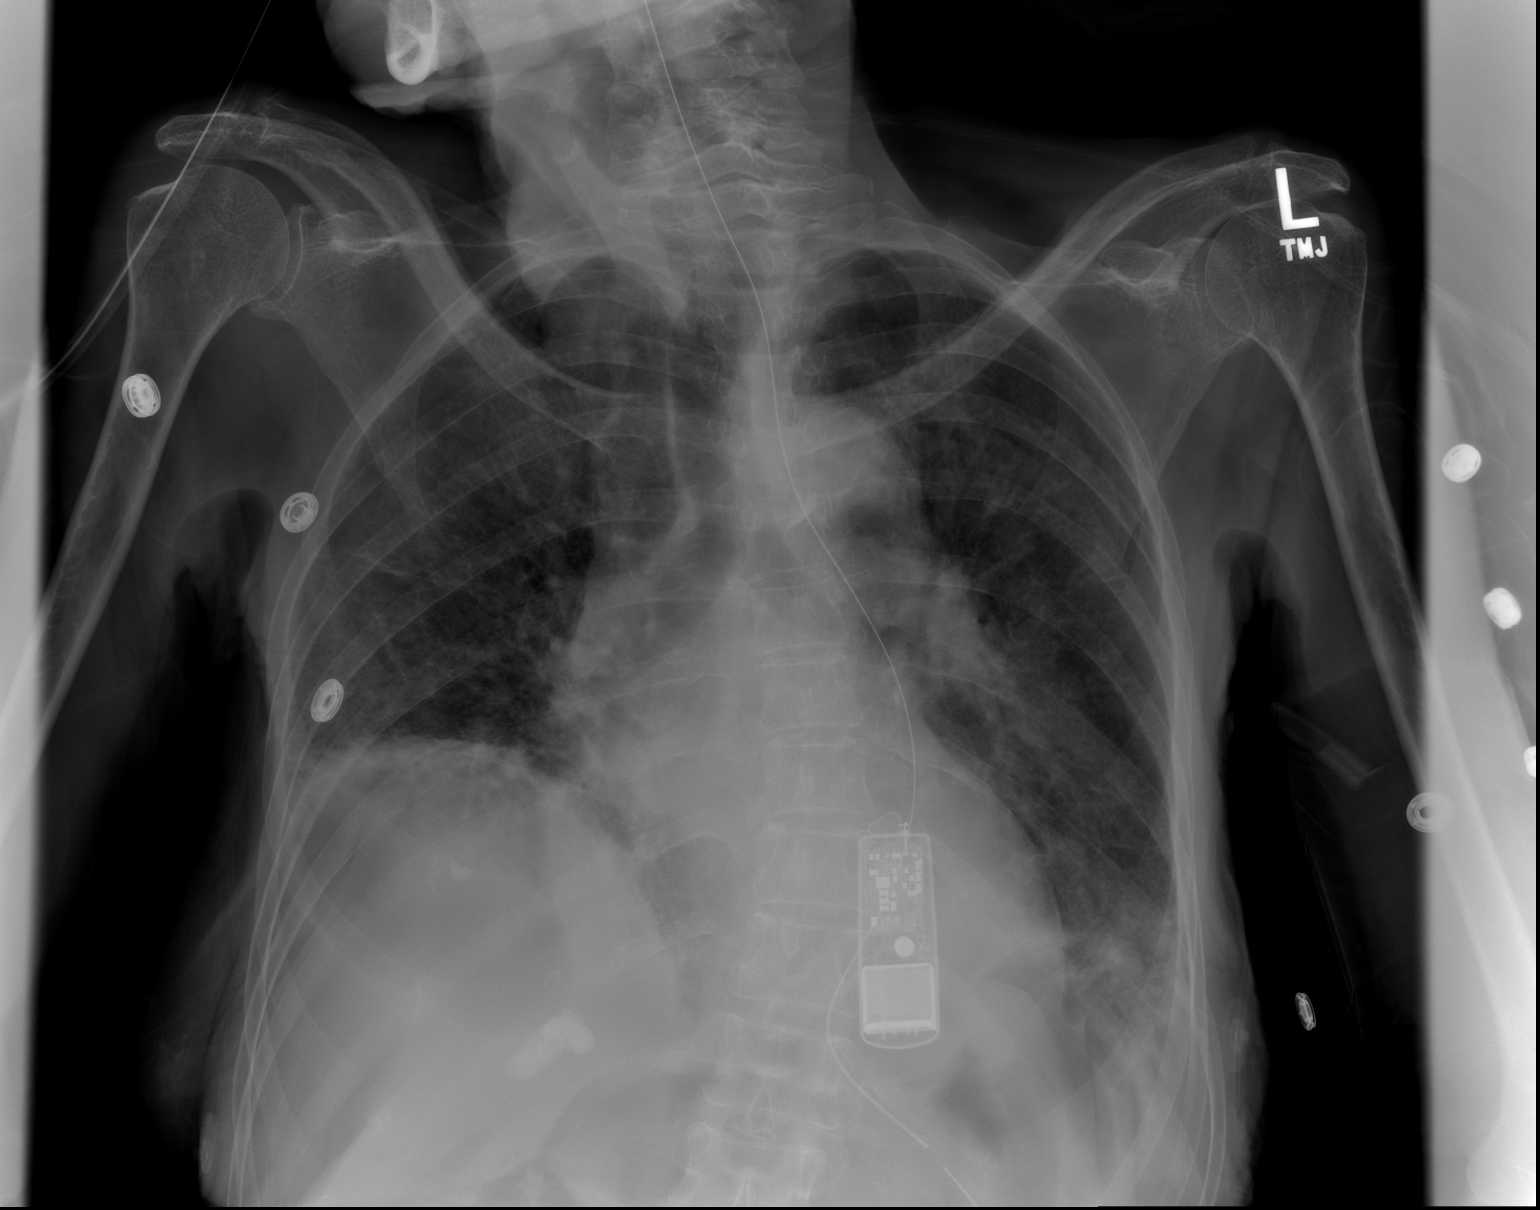

[x abdomen supine (1 of 2)]
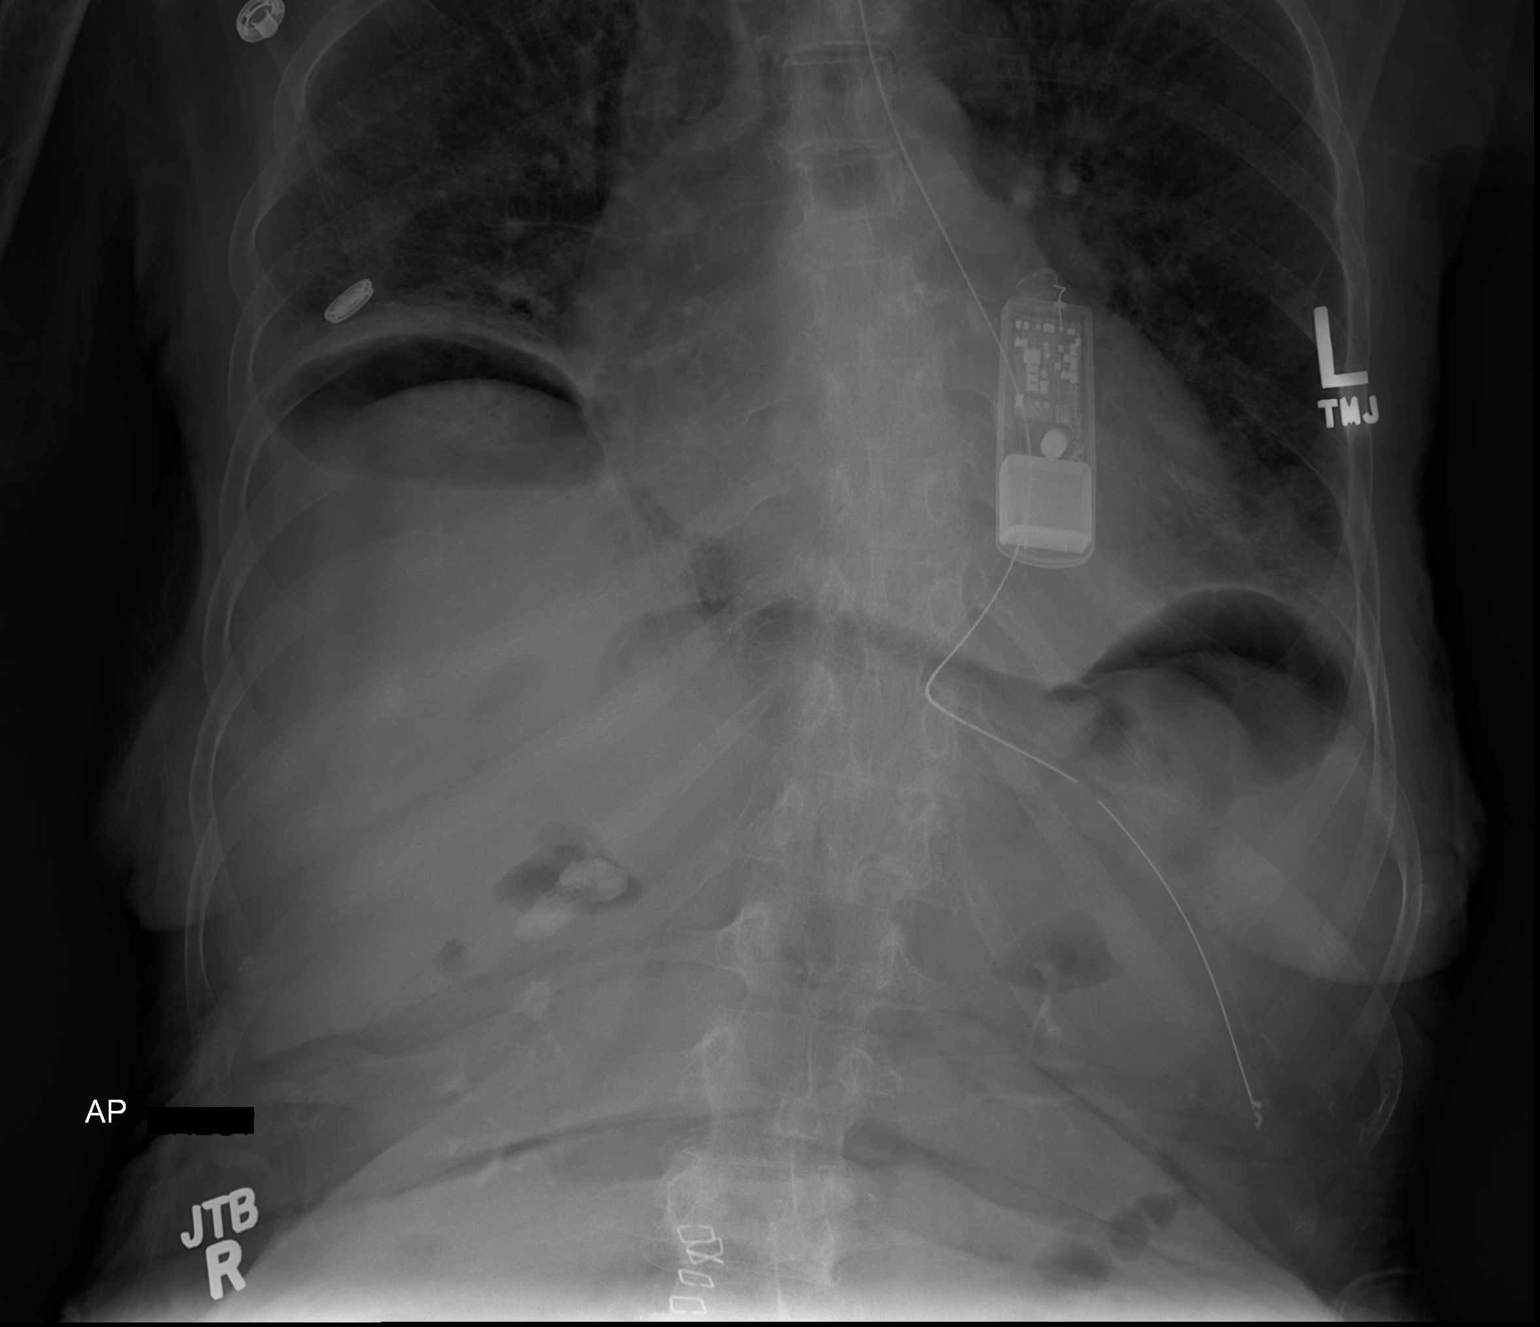

[x abdomen supine (2 of 2)]
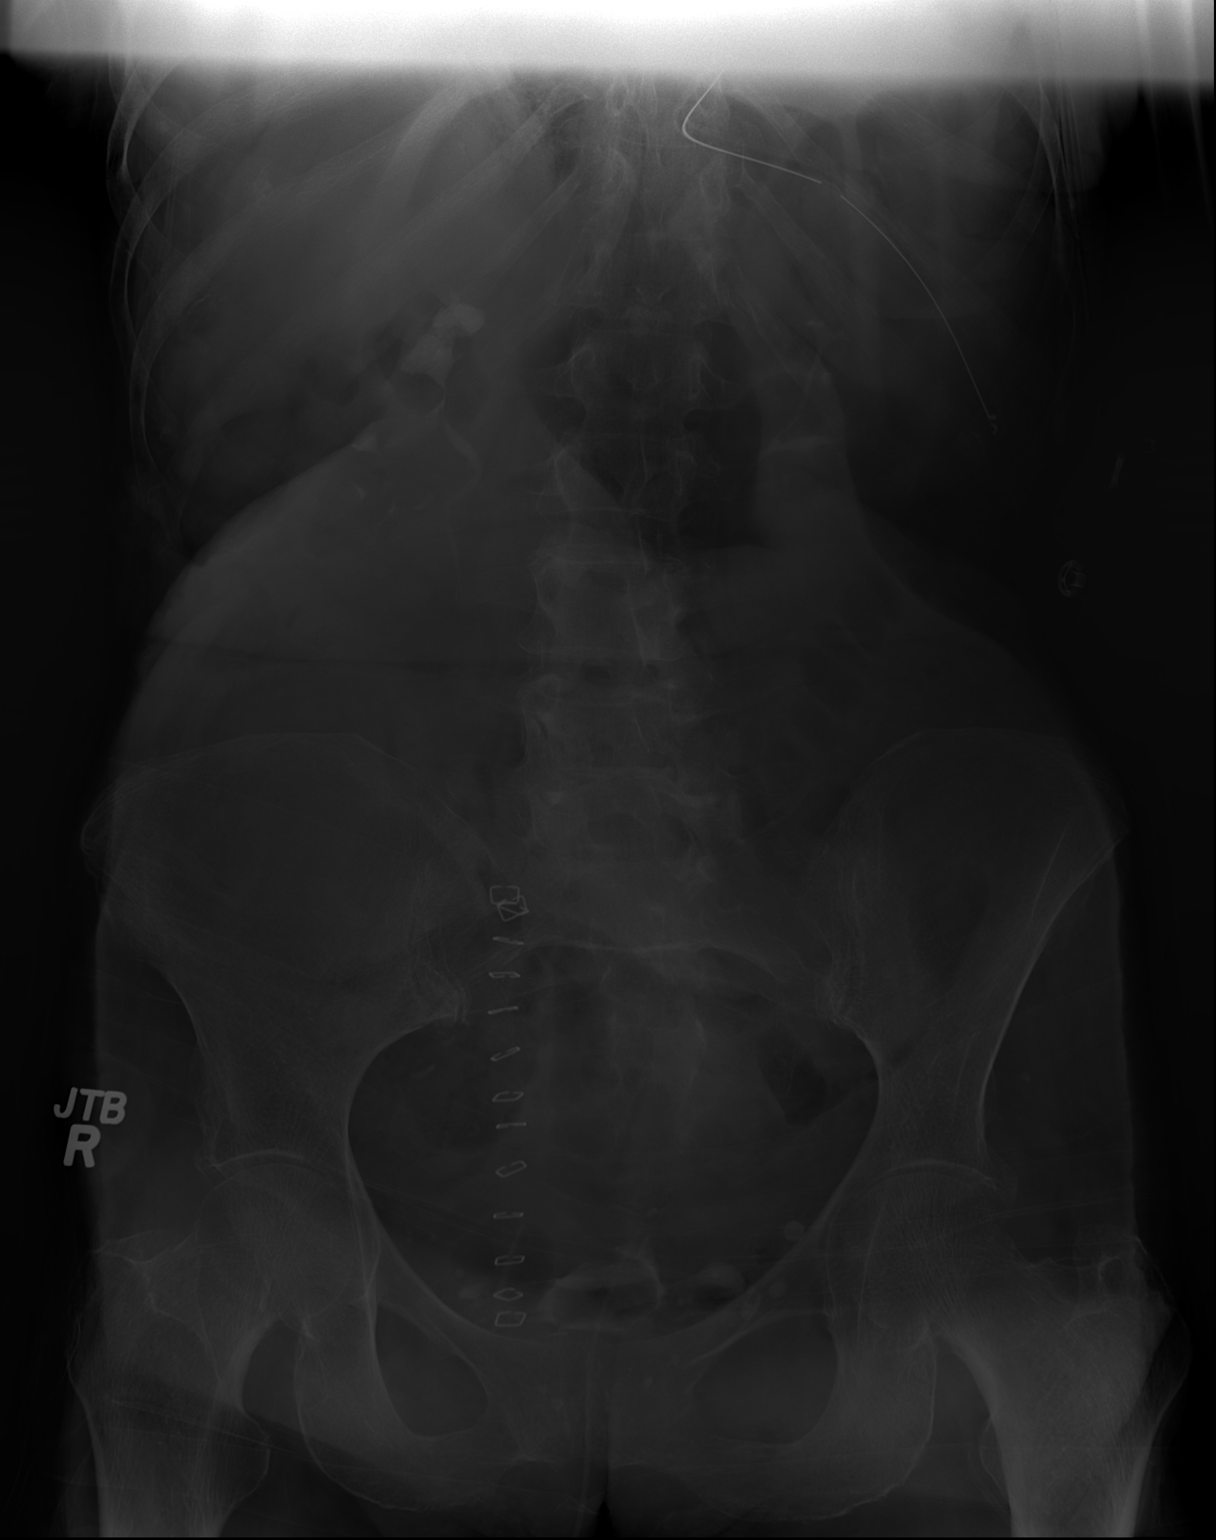

[3 of 3 positions shown; findings below may reference images not displayed]

FINDINGS: There is a nasogastric tube with tip in the stomach.

The heart size appears normal.

There is mild diffuse pulmonary edema.  Lung volumes are low and
there is asymmetric elevation of the right hemidiaphragm.  There is
atelectasis noted in both lung bases.

Free intraperitoneal air is present compatible with interval
surgery.

There is IV contrast material identified within the renal
collecting systems.

The bowel gas pattern has improved since the previous exam.
IMPRESSION: 1.  Interval improvement in bowel obstruction status post
exploratory laparotomy.
2.  Free intraperitoneal air compatible with recent surgery.
3.  Pulmonary edema.

## 2013-05-03 IMAGING — CR DG CHEST 2V
2 series · 2 of 2 positions shown · non-contrast
Comparison: 11/25/2011 and earlier.

CLINICAL DATA: 81-year-old female with shortness of breath and
weakness.

CHEST - 2 VIEW

[w chest lat]
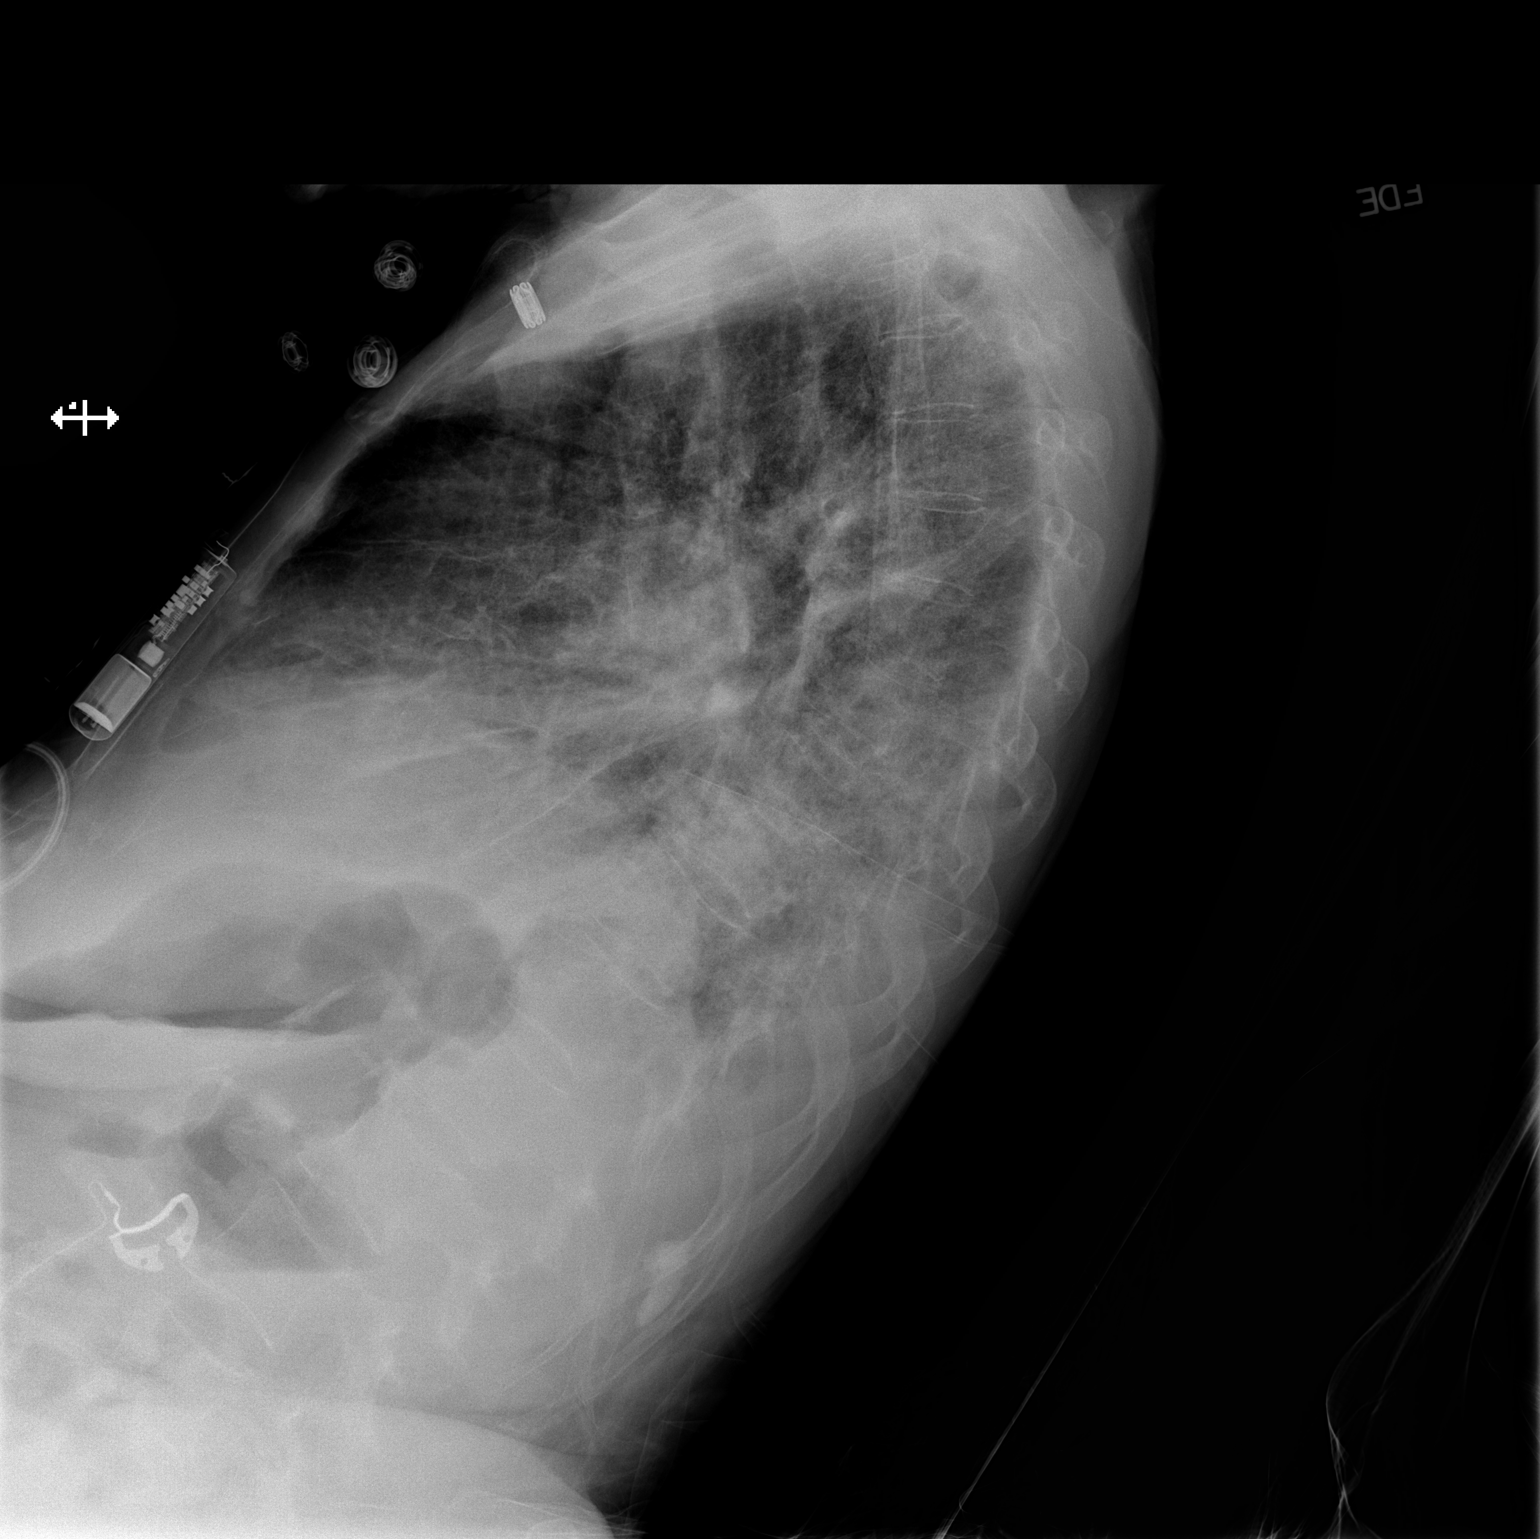

[x chest ap]
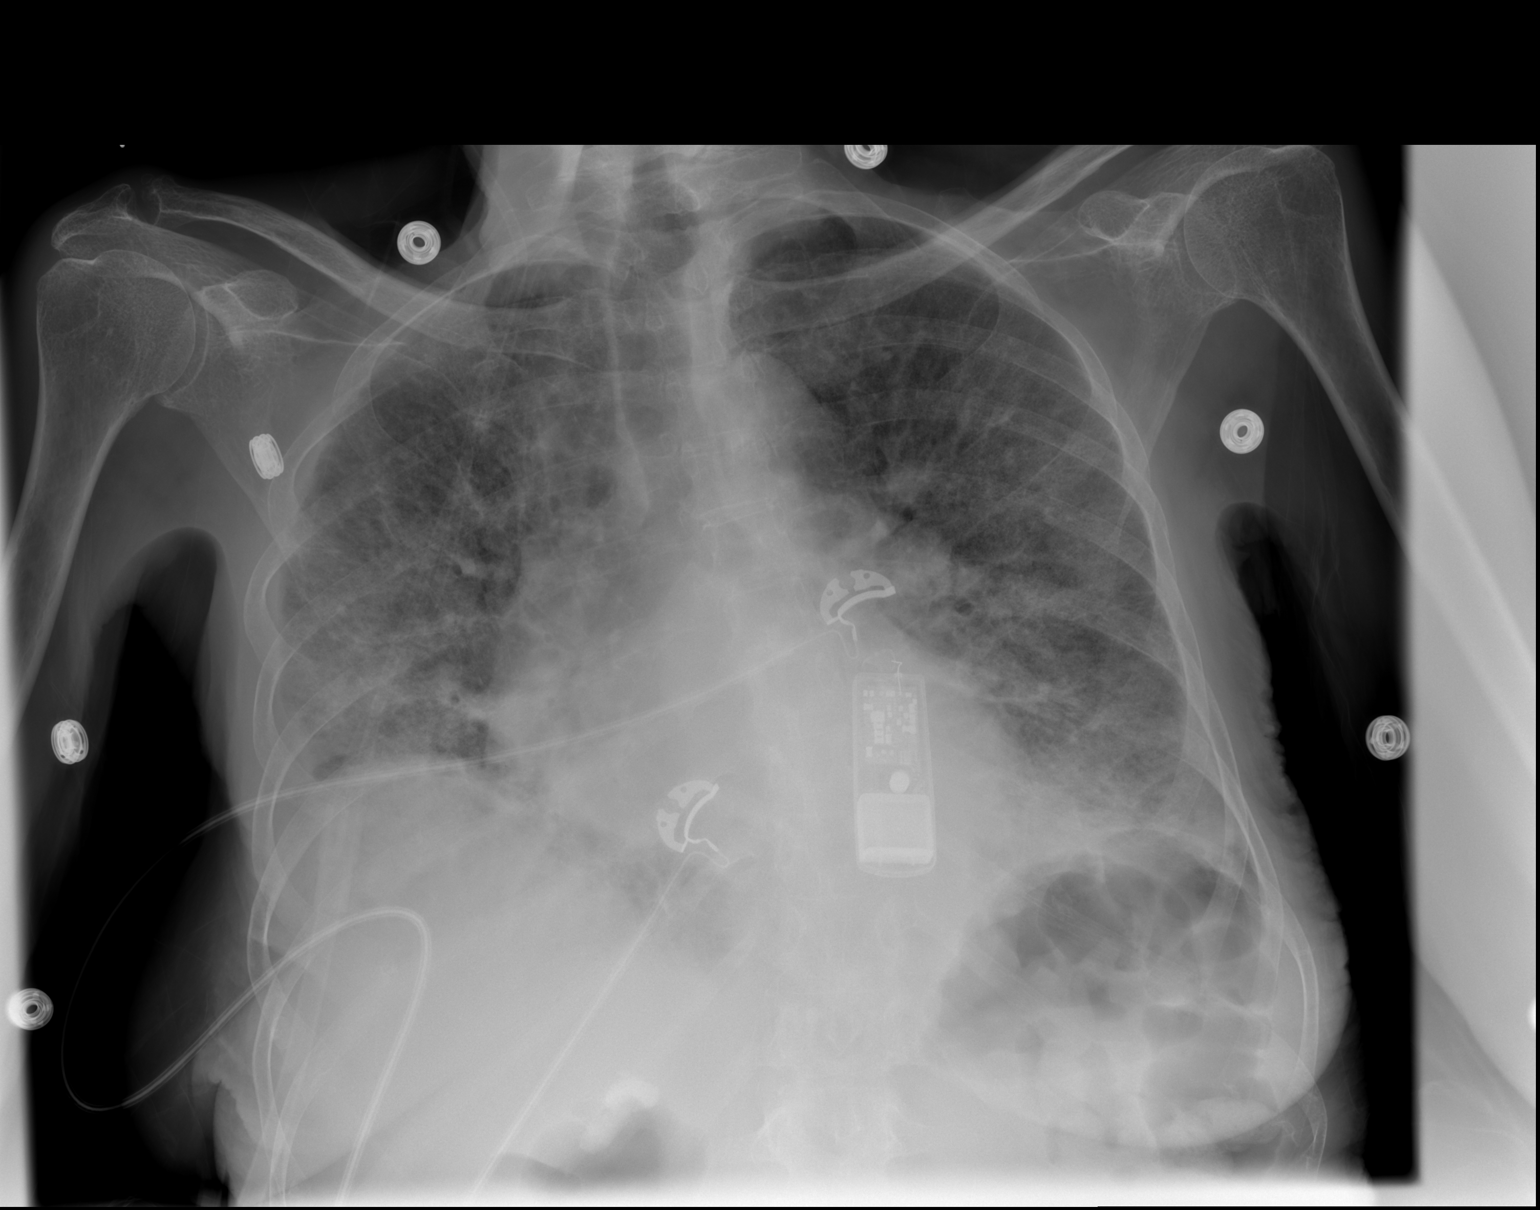

[2 of 2 positions shown; findings below may reference images not displayed]

FINDINGS: AP and lateral views of the chest.  Cardiac event
recorder again noted.  Lower lung volumes and increased
interstitial and basilar patchy opacity.  Stable cardiomegaly and
mediastinal contours.  Osteopenia.
IMPRESSION: Worsening pulmonary disease since 11/25/2011 in the form of
continued interstitial opacity and now more confluent basilar
airspace disease.  Appearance may reflect worsening pulmonary edema
with atelectasis or bilateral pneumonia.

## 2013-05-07 IMAGING — CR DG CHEST 2V
2 series · 2 of 2 positions shown · non-contrast
Comparison: 12/10/2011

CLINICAL DATA: Follow-up CHF, improved breathing

CHEST - 2 VIEW

[w chest lat]
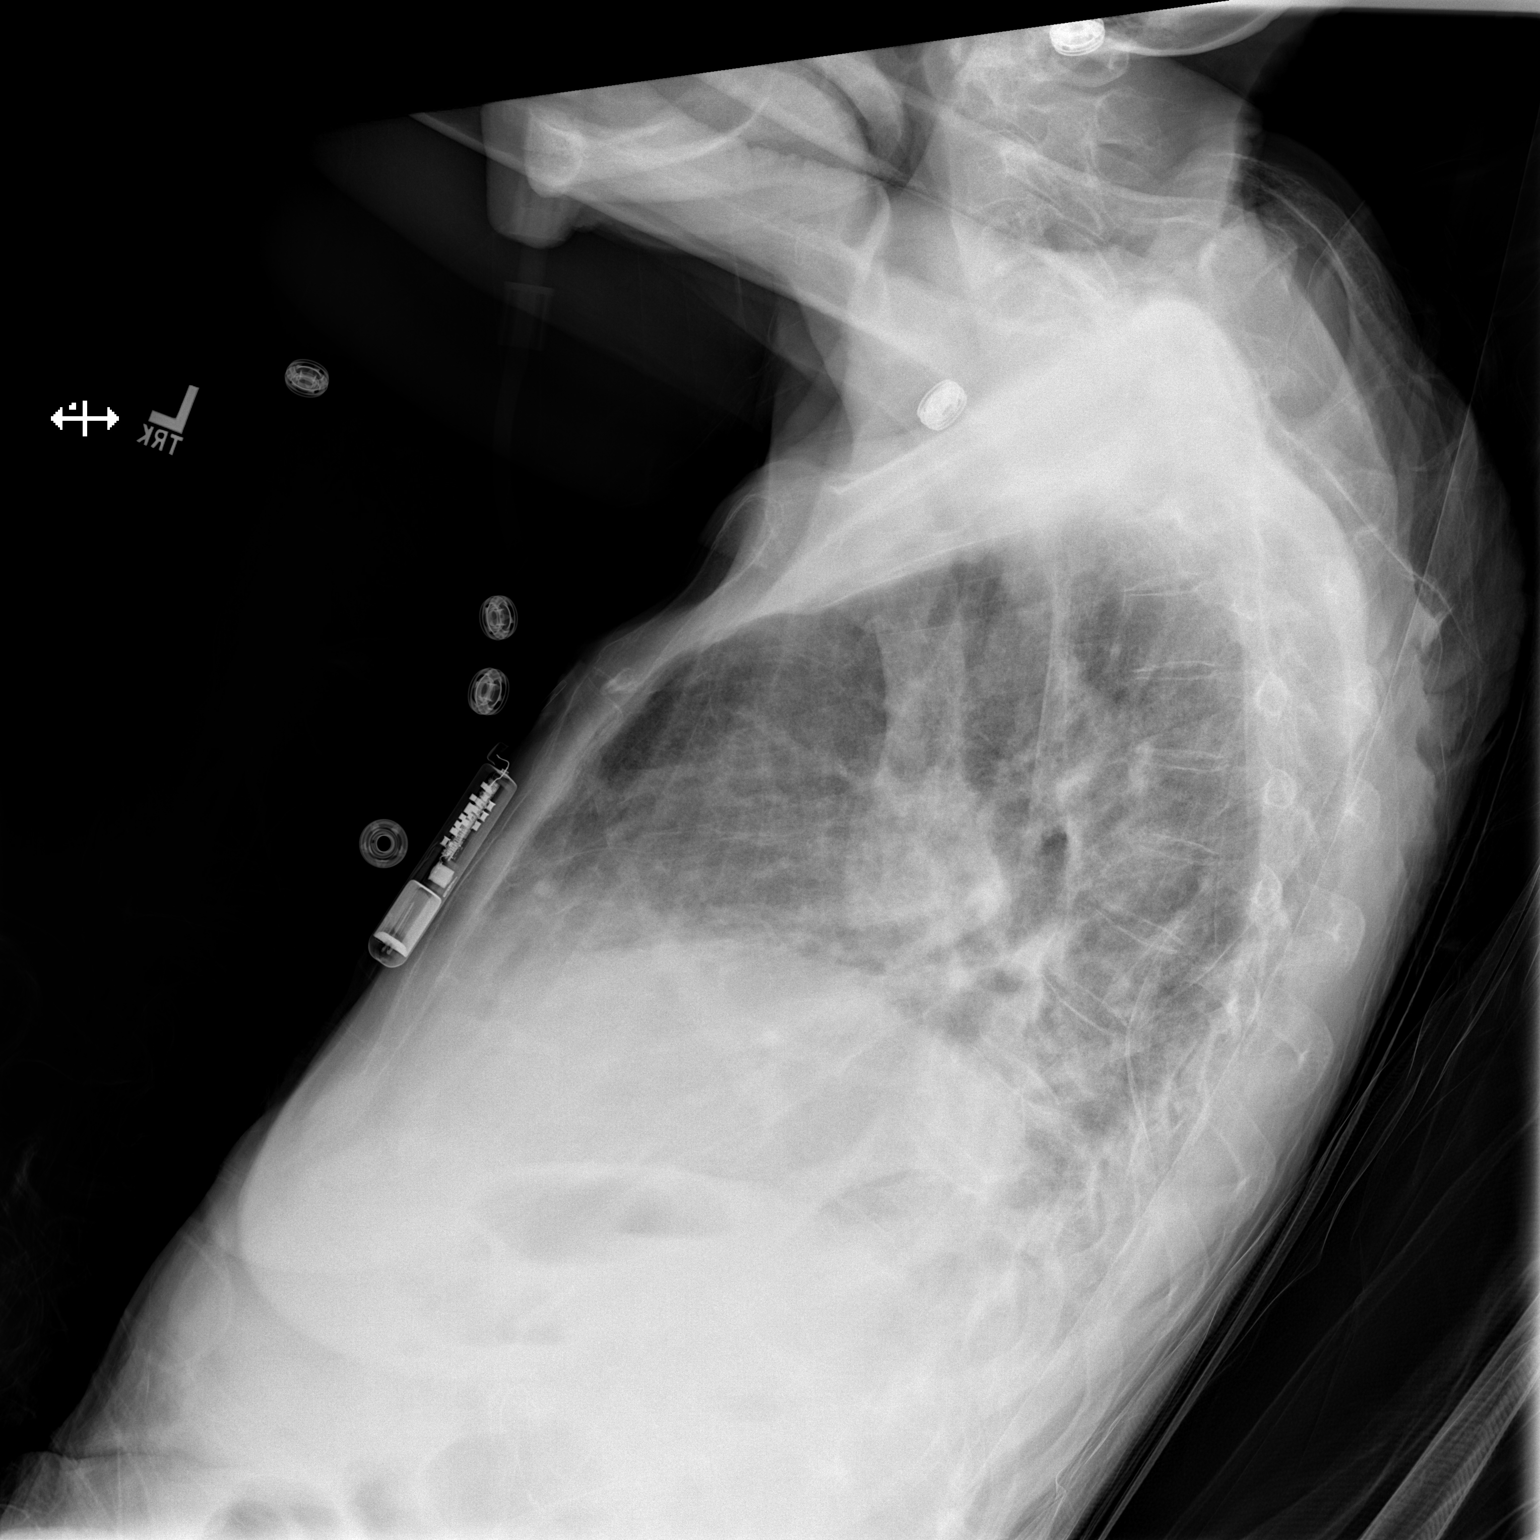

[x chest ap]
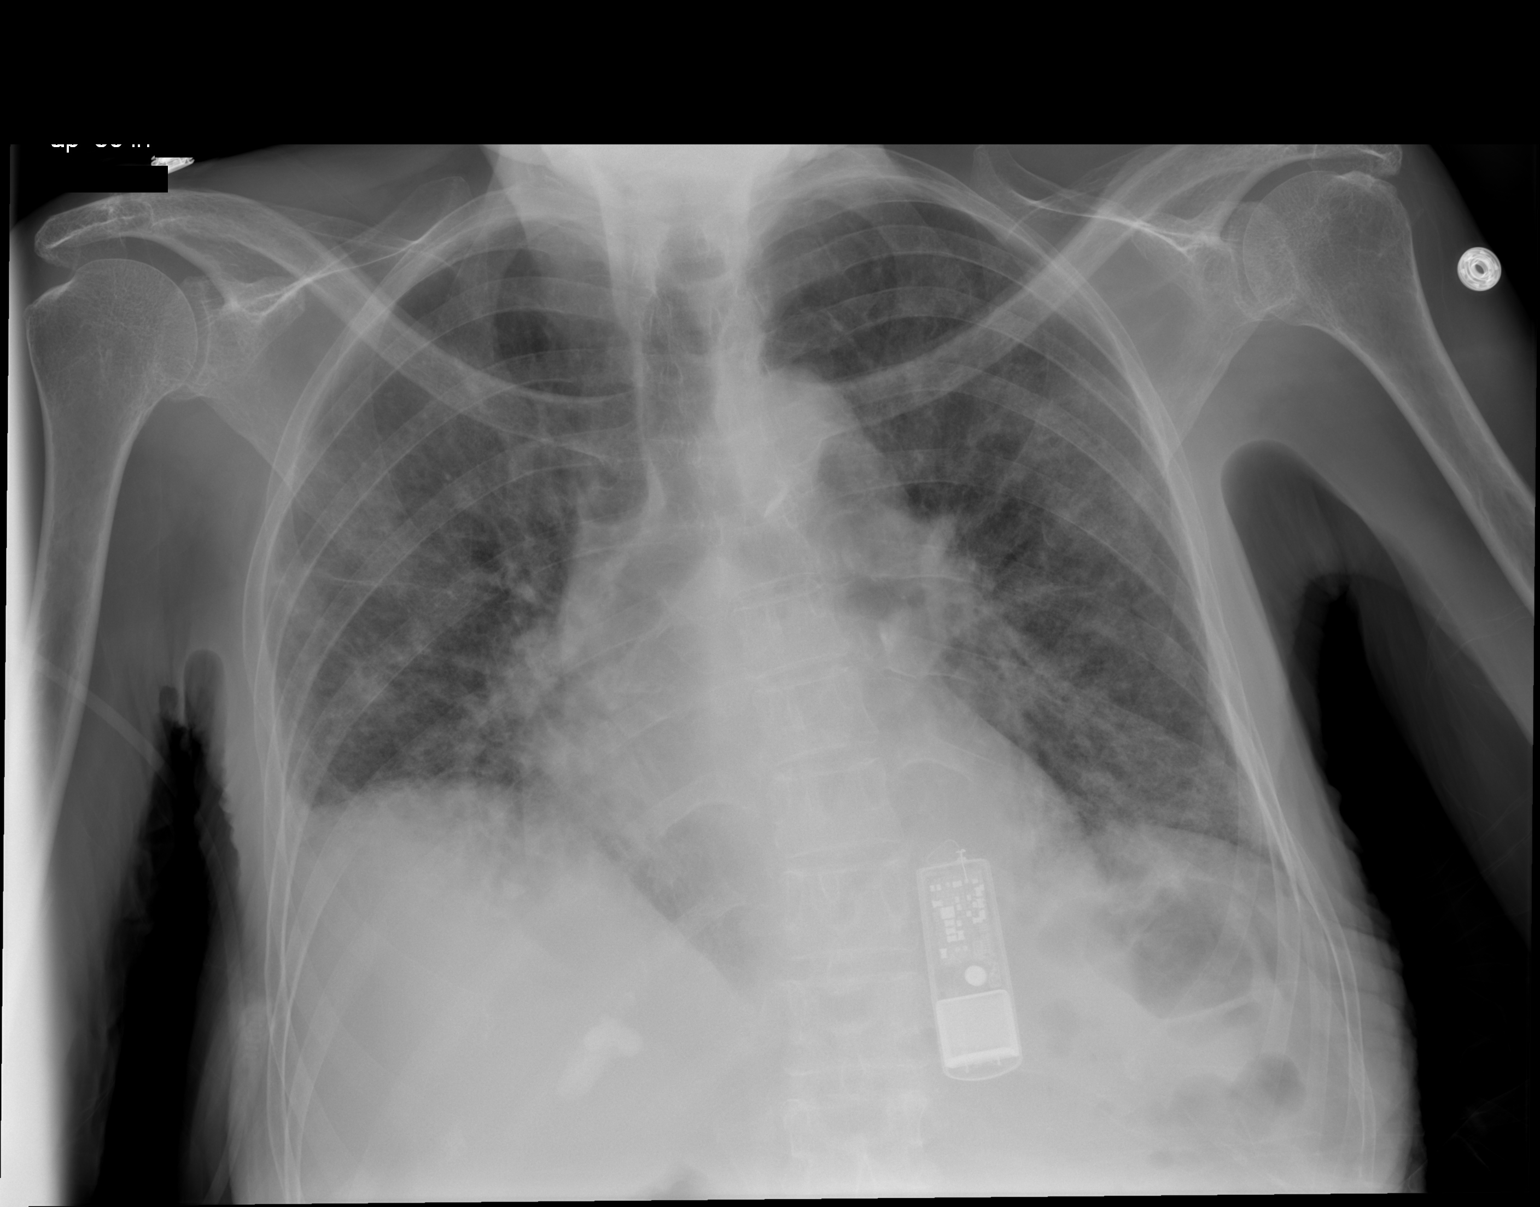

[2 of 2 positions shown; findings below may reference images not displayed]

FINDINGS: The cardiac shadow is stable.  Mild persistent vascular
congestion is noted with interstitial changes.  The overall
appearance has slightly improved when compared with prior exam.
There is persistent elevation of the right hemidiaphragm.  No new
focal abnormality is seen.
IMPRESSION: Slight improved vascular congestion with interstitial changes.
Continued follow-up is recommended as necessary.

## 2013-09-11 ENCOUNTER — Encounter (HOSPITAL_COMMUNITY): Payer: Self-pay | Admitting: Emergency Medicine

## 2013-09-11 ENCOUNTER — Inpatient Hospital Stay (HOSPITAL_COMMUNITY)
Admission: EM | Admit: 2013-09-11 | Discharge: 2013-09-13 | DRG: 281 | Disposition: A | Discharge status: Home / Self Care | Payer: Medicare Other | Attending: Cardiology | Admitting: Cardiology

## 2013-09-11 DIAGNOSIS — I214 Non-ST elevation (NSTEMI) myocardial infarction: Principal | ICD-10-CM | POA: Diagnosis present

## 2013-09-11 DIAGNOSIS — G40909 Epilepsy, unspecified, not intractable, without status epilepticus: Secondary | ICD-10-CM | POA: Diagnosis present

## 2013-09-11 DIAGNOSIS — R7989 Other specified abnormal findings of blood chemistry: Secondary | ICD-10-CM | POA: Diagnosis present

## 2013-09-11 DIAGNOSIS — K219 Gastro-esophageal reflux disease without esophagitis: Secondary | ICD-10-CM | POA: Diagnosis present

## 2013-09-11 DIAGNOSIS — I2510 Atherosclerotic heart disease of native coronary artery without angina pectoris: Secondary | ICD-10-CM | POA: Diagnosis present

## 2013-09-11 DIAGNOSIS — D638 Anemia in other chronic diseases classified elsewhere: Secondary | ICD-10-CM | POA: Diagnosis present

## 2013-09-11 DIAGNOSIS — Z87891 Personal history of nicotine dependence: Secondary | ICD-10-CM

## 2013-09-11 DIAGNOSIS — I428 Other cardiomyopathies: Secondary | ICD-10-CM | POA: Diagnosis present

## 2013-09-11 DIAGNOSIS — I452 Bifascicular block: Secondary | ICD-10-CM | POA: Diagnosis present

## 2013-09-11 DIAGNOSIS — R55 Syncope and collapse: Secondary | ICD-10-CM | POA: Diagnosis present

## 2013-09-11 DIAGNOSIS — Z7982 Long term (current) use of aspirin: Secondary | ICD-10-CM

## 2013-09-11 DIAGNOSIS — Z8673 Personal history of transient ischemic attack (TIA), and cerebral infarction without residual deficits: Secondary | ICD-10-CM

## 2013-09-11 DIAGNOSIS — R778 Other specified abnormalities of plasma proteins: Secondary | ICD-10-CM

## 2013-09-11 DIAGNOSIS — F0390 Unspecified dementia without behavioral disturbance: Secondary | ICD-10-CM | POA: Diagnosis present

## 2013-09-11 DIAGNOSIS — Z79899 Other long term (current) drug therapy: Secondary | ICD-10-CM

## 2013-09-11 DIAGNOSIS — I509 Heart failure, unspecified: Secondary | ICD-10-CM | POA: Diagnosis present

## 2013-09-11 DIAGNOSIS — I10 Essential (primary) hypertension: Secondary | ICD-10-CM | POA: Diagnosis present

## 2013-09-11 DIAGNOSIS — I252 Old myocardial infarction: Secondary | ICD-10-CM

## 2013-09-11 DIAGNOSIS — I5032 Chronic diastolic (congestive) heart failure: Secondary | ICD-10-CM | POA: Diagnosis present

## 2013-09-11 HISTORY — DX: Unspecified dementia without behavioral disturbance: F03.90

## 2013-09-11 HISTORY — DX: Gastro-esophageal reflux disease without esophagitis: K21.9

## 2013-09-11 HISTORY — DX: Unspecified osteoarthritis, unspecified site: M19.90

## 2013-09-11 HISTORY — DX: Presence of other cardiac implants and grafts: Z95.818

## 2013-09-11 HISTORY — DX: Non-ST elevation (NSTEMI) myocardial infarction: I21.4

## 2013-09-11 LAB — COMPREHENSIVE METABOLIC PANEL
ALT: 13 U/L (ref 0–35)
AST: 32 U/L (ref 0–37)
Albumin: 3.3 g/dL — ABNORMAL LOW (ref 3.5–5.2)
Alkaline Phosphatase: 71 U/L (ref 39–117)
BUN: 42 mg/dL — AB (ref 6–23)
CO2: 23 mEq/L (ref 19–32)
CREATININE: 1.14 mg/dL — AB (ref 0.50–1.10)
Calcium: 8.7 mg/dL (ref 8.4–10.5)
Chloride: 100 mEq/L (ref 96–112)
GFR calc non Af Amer: 44 mL/min — ABNORMAL LOW (ref >90)
GFR, EST AFRICAN AMERICAN: 51 mL/min — AB (ref >90)
GLUCOSE: 128 mg/dL — AB (ref 70–99)
Potassium: 4.3 mEq/L (ref 3.7–5.3)
SODIUM: 137 meq/L (ref 137–147)
TOTAL PROTEIN: 7.6 g/dL (ref 6.0–8.3)
Total Bilirubin: 0.4 mg/dL (ref 0.3–1.2)

## 2013-09-11 LAB — BASIC METABOLIC PANEL
BUN: 41 mg/dL — AB (ref 6–23)
CHLORIDE: 100 meq/L (ref 96–112)
CO2: 20 meq/L (ref 19–32)
CREATININE: 1.14 mg/dL — AB (ref 0.50–1.10)
Calcium: 9.4 mg/dL (ref 8.4–10.5)
GFR calc Af Amer: 51 mL/min — ABNORMAL LOW (ref >90)
GFR calc non Af Amer: 44 mL/min — ABNORMAL LOW (ref >90)
Glucose, Bld: 81 mg/dL (ref 70–99)
Potassium: 4.7 mEq/L (ref 3.7–5.3)
Sodium: 137 mEq/L (ref 137–147)

## 2013-09-11 LAB — CBC
HEMATOCRIT: 32 % — AB (ref 36.0–46.0)
HEMOGLOBIN: 10.7 g/dL — AB (ref 12.0–15.0)
MCH: 22.1 pg — ABNORMAL LOW (ref 26.0–34.0)
MCHC: 33.4 g/dL (ref 30.0–36.0)
MCV: 66.1 fL — AB (ref 78.0–100.0)
Platelets: 259 10*3/uL (ref 150–400)
RBC: 4.84 MIL/uL (ref 3.87–5.11)
RDW: 18.2 % — ABNORMAL HIGH (ref 11.5–15.5)
WBC: 5.7 10*3/uL (ref 4.0–10.5)

## 2013-09-11 LAB — PROTIME-INR
INR: 1.03 (ref 0.00–1.49)
Prothrombin Time: 13.3 seconds (ref 11.6–15.2)

## 2013-09-11 LAB — I-STAT TROPONIN, ED: Troponin i, poc: 0.43 ng/mL (ref 0.00–0.08)

## 2013-09-11 LAB — TSH: TSH: 2.45 u[IU]/mL (ref 0.350–4.500)

## 2013-09-11 LAB — CBC WITH DIFFERENTIAL/PLATELET
BASOS ABS: 0.1 10*3/uL (ref 0.0–0.1)
Basophils Relative: 1 % (ref 0–1)
Eosinophils Absolute: 0.1 10*3/uL (ref 0.0–0.7)
Eosinophils Relative: 1 % (ref 0–5)
HCT: 28.8 % — ABNORMAL LOW (ref 36.0–46.0)
Hemoglobin: 9.6 g/dL — ABNORMAL LOW (ref 12.0–15.0)
Lymphocytes Relative: 24 % (ref 12–46)
Lymphs Abs: 1.5 10*3/uL (ref 0.7–4.0)
MCH: 21.9 pg — ABNORMAL LOW (ref 26.0–34.0)
MCHC: 33.3 g/dL (ref 30.0–36.0)
MCV: 65.6 fL — AB (ref 78.0–100.0)
MONO ABS: 0.4 10*3/uL (ref 0.1–1.0)
MONOS PCT: 7 % (ref 3–12)
Neutro Abs: 4.1 10*3/uL (ref 1.7–7.7)
Neutrophils Relative %: 67 % (ref 43–77)
Platelets: 236 10*3/uL (ref 150–400)
RBC: 4.39 MIL/uL (ref 3.87–5.11)
RDW: 18.4 % — ABNORMAL HIGH (ref 11.5–15.5)
WBC: 6.2 10*3/uL (ref 4.0–10.5)

## 2013-09-11 LAB — TROPONIN I
Troponin I: 0.4 ng/mL (ref <0.30)
Troponin I: 0.5 ng/mL (ref <0.30)

## 2013-09-11 LAB — CBG MONITORING, ED: Glucose-Capillary: 82 mg/dL (ref 70–99)

## 2013-09-11 LAB — MAGNESIUM: MAGNESIUM: 2.2 mg/dL (ref 1.5–2.5)

## 2013-09-11 MED ORDER — POLYETHYLENE GLYCOL 3350 17 G PO PACK
17.0000 g | PACK | ORAL | Status: DC
  Administered 2013-09-12: 17 g via ORAL
  Filled 2013-09-11: qty 1

## 2013-09-11 MED ORDER — NITROGLYCERIN 0.4 MG SL SUBL: 0.4000 mg | SUBLINGUAL_TABLET | SUBLINGUAL | Status: DC | PRN

## 2013-09-11 MED ORDER — HEPARIN SODIUM (PORCINE) 5000 UNIT/ML IJ SOLN
5000.0000 [IU] | Freq: Three times a day (TID) | INTRAMUSCULAR | Status: DC
  Administered 2013-09-11 – 2013-09-13 (×5): 5000 [IU] via SUBCUTANEOUS
  Filled 2013-09-11 (×8): qty 1

## 2013-09-11 MED ORDER — ASPIRIN 81 MG PO CHEW
324.0000 mg | CHEWABLE_TABLET | ORAL | Status: AC
  Administered 2013-09-11: 324 mg via ORAL
  Filled 2013-09-11: qty 4

## 2013-09-11 MED ORDER — ASPIRIN EC 81 MG PO TBEC: 81.0000 mg | DELAYED_RELEASE_TABLET | Freq: Every day | ORAL | Status: DC

## 2013-09-11 MED ORDER — METOPROLOL TARTRATE 12.5 MG HALF TABLET
12.5000 mg | ORAL_TABLET | Freq: Two times a day (BID) | ORAL | Status: DC
  Administered 2013-09-11 – 2013-09-13 (×3): 12.5 mg via ORAL
  Filled 2013-09-11 (×5): qty 1

## 2013-09-11 MED ORDER — SODIUM CHLORIDE 0.9 % IV SOLN: INTRAVENOUS | Status: DC

## 2013-09-11 MED ORDER — ADULT MULTIVITAMIN W/MINERALS CH
1.0000 | ORAL_TABLET | Freq: Every day | ORAL | Status: DC
  Administered 2013-09-12 – 2013-09-13 (×2): 1 via ORAL
  Filled 2013-09-11 (×2): qty 1

## 2013-09-11 MED ORDER — SIMVASTATIN 20 MG PO TABS
20.0000 mg | ORAL_TABLET | Freq: Every evening | ORAL | Status: DC
  Administered 2013-09-11 – 2013-09-12 (×2): 20 mg via ORAL
  Filled 2013-09-11 (×3): qty 1

## 2013-09-11 MED ORDER — DONEPEZIL HCL 5 MG PO TABS
5.0000 mg | ORAL_TABLET | Freq: Every day | ORAL | Status: DC
  Administered 2013-09-11 – 2013-09-12 (×2): 5 mg via ORAL
  Filled 2013-09-11 (×3): qty 1

## 2013-09-11 MED ORDER — ASPIRIN EC 81 MG PO TBEC
81.0000 mg | DELAYED_RELEASE_TABLET | Freq: Every day | ORAL | Status: DC
  Administered 2013-09-12 – 2013-09-13 (×2): 81 mg via ORAL
  Filled 2013-09-11 (×2): qty 1

## 2013-09-11 MED ORDER — ENSURE COMPLETE PO LIQD
237.0000 mL | Freq: Two times a day (BID) | ORAL | Status: DC
  Administered 2013-09-12 – 2013-09-13 (×2): 237 mL via ORAL

## 2013-09-11 MED ORDER — LISINOPRIL 2.5 MG PO TABS: 2.5000 mg | ORAL_TABLET | Freq: Every day | ORAL | Status: DC

## 2013-09-11 MED ORDER — PANTOPRAZOLE SODIUM 40 MG PO TBEC
40.0000 mg | DELAYED_RELEASE_TABLET | Freq: Every day | ORAL | Status: DC
  Administered 2013-09-11 – 2013-09-13 (×3): 40 mg via ORAL
  Filled 2013-09-11 (×3): qty 1

## 2013-09-11 MED ORDER — ASPIRIN 300 MG RE SUPP: 300.0000 mg | RECTAL | Status: AC

## 2013-09-11 NOTE — ED Notes (Signed)
Pt's meal at bedside. Eating her lunch with son at bedside. Pt pulled IV out. Will restart after she finishes eating.

## 2013-09-11 NOTE — ED Notes (Signed)
Pt asking for something to eat

## 2013-09-11 NOTE — ED Notes (Signed)
NOTIFIED DR. D. RAY IN PERSON OF PANIC LAB RESULTS OF I-STAT TROPONIN ,@13 :36 PM ,09/11/2013.

## 2013-09-11 NOTE — H&P (Signed)
[name] is an 80 y.o. female.   Chief Complaint: Status post questionable syncope HPI: Patient is 80 year old female with past medical history significant for multiple medical problems i.e. Coronary artery disease history of non-Q-wave myocardial infarction in the past the with minimally elevated troponin I. and subsequently had cardiac cath is noted to have mild coronary artery disease, hypertension, GERD, dementia, anemia of chronic disease, history of recurrent syncope, workup negative in the past including loop recorder, compensated congestive heart failure secondary to diastolic dysfunction/5 in heart disease, history of questionable seizure disorder, resident of assisted living while sitting in the chair and sudden episode of blank stare and questionable syncopal episode for a few seconds. Patient denies any palpitations right to this episode denies any chest pain but felt nauseous. Denies any seizure activity. Patient now complains of left-sided shoulder and neck pain. Patient denies any weakness in the arms or legs denies any slurred speech. He taken in the ER showed normal sinus rhythm with right bundle branch block and left posterior fascicular block and was noted to have minimally elevated troponin I. Patient had similar presentation with minimally elevated troponin I. in the past  as above.  Past Medical History  Diagnosis Date  . Syncope   . Unspecified cardiac device in situ     Loop recorder  . Atypical chest pain     Nonobstructive catheterization; negative Myoview 2011  . CVA 11/23/2008  . SEIZURE DISORDER 11/23/2008  . DEMENTIA 11/23/2008  . HYPERTENSION 11/23/2008  . Coronary artery disease   . 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 Heart attack   . Other primary cardiomyopathies     Past Surgical History  Procedure Laterality Date  . Loop recorder insertion    . Colonoscopy    . Eye surgery    . Cataract extraction, bilateral    . Laparotomy  11/30/2011   Procedure: EXPLORATORY LAPAROTOMY;  Surgeon: Zenovia Jarred, MD;  Location: [facility] OR;  Service: General;  Laterality: N/A;  Exploratory Laparotomy with Primary Repair Obturator Hernia    Family History  Problem Relation Age of Onset  . Heart failure Neg Hx   . Stroke    . Heart disease     Social History:  reports that she has quit smoking. She has never used smokeless tobacco. She reports that she does not drink alcohol or use illicit drugs.  Allergies:  Allergies  Allergen Reactions  . Codeine Other (See Comments)    UNKNOWN; Per MAR    Medications Prior to Admission  Medication Sig Dispense Refill  . aspirin EC 81 MG EC tablet Take 1 tablet (81 mg total) by mouth daily.  30 tablet  3  . donepezil (ARICEPT) 5 MG tablet Take 5 mg by mouth at bedtime.      . furosemide (LASIX) 40 MG tablet Take 40 mg by mouth daily.      Marland Kitchen lisinopril (PRINIVIL,ZESTRIL) 5 MG tablet Take 2.5 mg by mouth daily.      . Multiple Vitamin (MULTIVITAMIN WITH MINERALS) TABS Take 1 tablet by mouth daily.      Marland Kitchen omeprazole (PRILOSEC) 20 MG capsule Take 20 mg by mouth daily.      . polyethylene glycol (MIRALAX / GLYCOLAX) packet Take 17 g by mouth every other day.       . potassium chloride SA (K-DUR,KLOR-CON) 20 MEQ tablet Take 20 mEq by mouth daily.      Marland Kitchen PRESCRIPTION MEDICATION Take 1 tablet by mouth daily. "Like  Aspirin, Red tablet"      . simvastatin (ZOCOR) 20 MG tablet Take 20 mg by mouth every evening.      . feeding supplement (ENSURE COMPLETE) LIQD Take 237 mLs by mouth 2 (two) times daily between meals.  60 Bottle  1    Results for orders placed during the hospital encounter of 09/11/13 (from the past 48 hour(s))  CBC     Status: Abnormal   Collection Time    09/11/13 12:56 PM      Result Value Ref Range   WBC 5.7  4.0 - 10.5 K/uL   RBC 4.84  3.87 - 5.11 MIL/uL   Hemoglobin 10.7 (*) 12.0 - 15.0 g/dL   HCT 32.0 (*) 36.0 - 46.0 %   MCV 66.1 (*) 78.0 - 100.0 fL   MCH 22.1 (*) 26.0 - 34.0 pg    MCHC 33.4  30.0 - 36.0 g/dL   RDW 18.2 (*) 11.5 - 15.5 %   Platelets 259  150 - 400 K/uL  BASIC METABOLIC PANEL     Status: Abnormal   Collection Time    09/11/13 12:56 PM      Result Value Ref Range   Sodium 137  137 - 147 mEq/L   Potassium 4.7  3.7 - 5.3 mEq/L   Chloride 100  96 - 112 mEq/L   CO2 20  19 - 32 mEq/L   Glucose, Bld 81  70 - 99 mg/dL   BUN 41 (*) 6 - 23 mg/dL   Creatinine, Ser 1.14 (*) 0.50 - 1.10 mg/dL   Calcium 9.4  8.4 - 10.5 mg/dL   GFR calc non Af Amer 44 (*) >90 mL/min   GFR calc Af Amer 51 (*) >90 mL/min   Comment: (NOTE)     The eGFR has been calculated using the CKD EPI equation.     This calculation has not been validated in all clinical situations.     eGFR's persistently <90 mL/min signify possible Chronic Kidney     Disease.  CBG MONITORING, ED     Status: None   Collection Time    09/11/13  1:03 PM      Result Value Ref Range   Glucose-Capillary 82  70 - 99 mg/dL  I-STAT TROPOININ, ED     Status: Abnormal   Collection Time    09/11/13  1:12 PM      Result Value Ref Range   Troponin i, poc 0.43 (*) 0.00 - 0.08 ng/mL   Comment NOTIFIED PHYSICIAN     Comment 3            Comment: Due to the release kinetics of cTnI,     a negative result within the first hours     of the onset of symptoms does not rule out     myocardial infarction with certainty.     If myocardial infarction is still suspected,     repeat the test at appropriate intervals.   No results found.  Review of Systems  Constitutional: Negative for fever, chills and weight loss.  HENT: Negative for hearing loss.   Eyes: Negative for blurred vision and double vision.  Respiratory: Negative for cough, hemoptysis, sputum production and shortness of breath.   Cardiovascular: Negative for chest pain, palpitations, orthopnea, claudication and leg swelling.  Gastrointestinal: Negative for nausea and abdominal pain.  Genitourinary: Negative for dysuria and urgency.  Neurological: Negative  for dizziness and headaches.    Blood pressure 102/58, pulse 69,  temperature 98 F (36.7 C), temperature source Oral, resp. rate 18, height 5' (1.524 m), weight 51.619 kg (113 lb 12.8 oz), SpO2 100.00%. Physical Exam  Constitutional: She is oriented to person, place, and time.  HENT:  Head: Normocephalic and atraumatic.  Eyes: Conjunctivae are normal. Left eye exhibits no discharge. No scleral icterus.  Neck: Normal range of motion. Neck supple. No JVD present. No tracheal deviation present. No thyromegaly present.  Cardiovascular: Normal rate and regular rhythm.   Murmur (2/6 systolic murmur noted no S3 gallop) heard. Respiratory: Effort normal and breath sounds normal. No respiratory distress. She has no wheezes. She has no rales.  GI: Soft. Bowel sounds are normal. She exhibits no distension. There is no tenderness. There is no rebound and no guarding.  Musculoskeletal: She exhibits no edema and no tenderness.  Neurological: She is alert and oriented to person, place, and time. No cranial nerve deficit.     Assessment/Plan Status post syncope rule out cardiac arrhythmias Minimally elevated troponin I. rule out MI Mild coronary artery disease in the past Hypertension GERD Dementia Anemia of chronic disease History of syncope in the past Compensated congestive heart failure secondary to preserved LV systolic function/valvular heart disease History of questionable seizure disorder Plan As per orders Check serial enzymes and EKG Schedule for nuclear stress test in a.m.  Clent Demark 09/11/2013, 5:16 PM

## 2013-09-11 NOTE — ED Provider Notes (Signed)
CSN: [id number]     Arrival date & time 09/11/13  1152 History   First MD Initiated Contact with Patient 09/11/13 1327     Chief Complaint  Patient presents with  . Near Syncope     (Consider location/radiation/quality/duration/timing/severity/associated sxs/prior Treatment) HPI 80 year old woman with history of CAD s/p MI with LHC in 10/2011 (Dr. Sharyn Lull), CHF, syncopal episodes s/p loop recorder, CVA, seizure disorder, dementia who presents with ?near syncope.  Patient's daughter at bedside and provided most of history.   Patient's daughter called by adult day care earlier today that patient was staring blankly and was unresponsive for several seconds so EMS called.  She reportedly did not lose consciousness or fall out of her chair.  She had one episode of emesis immediately following the unresponsive episode, no other symptoms including chest pain, shortness of breath.  Patient states she forgot to eat and take her medicines this morning which she feels caused this episode.  Of note, patient admitted in 12/2012 for syncope and elevated troponin thought 2/2 hypotension.    Patient lives with her granddaughter who provides assistance with medications.  No seizure activity in many years, not on anti-epileptic. Previous interrogations of loop recorder showed no evidence of significant arrythmias.    Past Medical History  Diagnosis Date  . Syncope   . Unspecified cardiac device in situ     Loop recorder  . Atypical chest pain     Nonobstructive catheterization; negative Myoview 2011  . CVA 11/23/2008  . SEIZURE DISORDER 11/23/2008  . DEMENTIA 11/23/2008  . HYPERTENSION 11/23/2008  . Coronary artery disease   . 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 Heart attack   . Other primary cardiomyopathies    Past Surgical History  Procedure Laterality Date  . Loop recorder insertion    . Colonoscopy    . Eye surgery    . Cataract extraction, bilateral    . Laparotomy  11/30/2011     Procedure: EXPLORATORY LAPAROTOMY;  Surgeon: Liz Malady, MD;  Location: [facility] OR;  Service: General;  Laterality: N/A;  Exploratory Laparotomy with Primary Repair Obturator Hernia   Family History  Problem Relation Age of Onset  . Heart failure Neg Hx   . Stroke    . Heart disease     History  Substance Use Topics  . Smoking status: Former Games developer  . Smokeless tobacco: Never Used  . Alcohol Use: No   OB History   Grav Para Term Preterm Abortions TAB SAB Ect Mult Living                 Review of Systems  Constitutional: Negative for fever, activity change, appetite change and fatigue.  Respiratory: Negative for cough, chest tightness and shortness of breath.   Cardiovascular: Negative for chest pain, palpitations and leg swelling.  Gastrointestinal: Negative for nausea, abdominal pain, diarrhea and constipation.  Genitourinary: Negative for dysuria.  Musculoskeletal: Negative for arthralgias and back pain.  Neurological: Negative for dizziness and headaches.    Allergies  Codeine  Home Medications   Prior to Admission medications   Medication Sig Start Date End Date Taking? Authorizing Provider  aspirin EC 81 MG EC tablet Take 1 tablet (81 mg total) by mouth daily. 01/22/13  Yes Robynn Pane, MD  donepezil (ARICEPT) 5 MG tablet Take 5 mg by mouth at bedtime.   Yes Historical Provider, MD  furosemide (LASIX) 40 MG tablet Take 40 mg by mouth daily.   Yes Historical  Provider, MD  lisinopril (PRINIVIL,ZESTRIL) 5 MG tablet Take 2.5 mg by mouth daily.   Yes Historical Provider, MD  Multiple Vitamin (MULTIVITAMIN WITH MINERALS) TABS Take 1 tablet by mouth daily.   Yes Historical Provider, MD  omeprazole (PRILOSEC) 20 MG capsule Take 20 mg by mouth daily.   Yes Historical Provider, MD  polyethylene glycol (MIRALAX / GLYCOLAX) packet Take 17 g by mouth every other day.    Yes Historical Provider, MD  potassium chloride SA (K-DUR,KLOR-CON) 20 MEQ tablet Take 20 mEq by mouth daily.    Yes Historical Provider, MD  PRESCRIPTION MEDICATION Take 1 tablet by mouth daily. "Like Aspirin, Red tablet"   Yes Historical Provider, MD  simvastatin (ZOCOR) 20 MG tablet Take 20 mg by mouth every evening.   Yes Historical Provider, MD  feeding supplement (ENSURE COMPLETE) LIQD Take 237 mLs by mouth 2 (two) times daily between meals. 12/15/11   Robynn Pane, MD   BP 102/55  Pulse 73  Temp(Src) 97.7 F (36.5 C) (Oral)  Resp 23  SpO2 97% Physical Exam  Constitutional: She is oriented to person, place, and time. She appears well-developed and well-nourished. No distress.  HENT:  Head: Normocephalic and atraumatic.  Eyes: Conjunctivae are normal. Pupils are equal, round, and reactive to light.  Neck: Normal range of motion. Neck supple.  Cardiovascular: Normal rate and regular rhythm.  Exam reveals no gallop and no friction rub.   No murmur heard. Pulmonary/Chest: Effort normal and breath sounds normal. No respiratory distress.  Abdominal: Soft. Bowel sounds are normal. She exhibits no distension. There is no tenderness.  Musculoskeletal: Normal range of motion.  Neurological: She is alert and oriented to person, place, and time.  Skin: Skin is warm and dry. She is not diaphoretic.    ED Course  Procedures (including critical care time) Labs Review Labs Reviewed  CBC - Abnormal; Notable for the following:    Hemoglobin 10.7 (*)    HCT 32.0 (*)    MCV 66.1 (*)    MCH 22.1 (*)    RDW 18.2 (*)    All other components within normal limits  BASIC METABOLIC PANEL - Abnormal; Notable for the following:    BUN 41 (*)    Creatinine, Ser 1.14 (*)    GFR calc non Af Amer 44 (*)    GFR calc Af Amer 51 (*)    All other components within normal limits  I-STAT TROPOININ, ED - Abnormal; Notable for the following:    Troponin i, poc 0.43 (*)    All other components within normal limits  TROPONIN I  CBG MONITORING, ED    Imaging Review No results found.   EKG  Interpretation   Date/Time:  Tuesday September 11 2013 12:03:57 EDT Ventricular Rate:  59 PR Interval:  136 QRS Duration: 152 QT Interval:  491 QTC Calculation: 486 R Axis:   94 Text Interpretation:  Age not entered, assumed to be  80 years old for  purpose of ECG interpretation Sinus rhythm RBBB and LPFB No significant  change was found  from first prior Confirmed by RAY MD, DANIELLE [phone])  on 09/11/2013 1:37:43 PM      MDM   Final diagnoses:  Elevated troponin  Elevated troponin-  Patient presents after unresponsive ?syncopal episode at adult day care.  Significant cardiac history per above.   EKG with sinus rhythm, RBBB, unchanged from previous.  Troponin elevated to 0.43.    No chest pain, shortness of breath, nausea/vomiting,  numbness, diaphoresis.  BP soft on arrival, given NS 300 cc bolus, now 100s systolic.   Admit to cardiology- Spoke to Dr. Sharyn LullHarwani, he will admit patient for observation and cycling of troponins.  Of note, patient had similarly elevated troponins at admission in 2013 with catheterization at that time that showed good LV systolic function with EF 55-60%, 5-10% distal stenosis of left main, 10-15% proximal and mid-stenosis, 5-10% ostial stenosis of left circumflex, 10-15% mid-stenosis of RCA.  Patient also had similarly elevated troponins at admission in 12/2012 for syncope.   Rocco SereneMorgan Romualdo Prosise, MD 09/11/13 [phone]

## 2013-09-11 NOTE — ED Notes (Signed)
MD reporting pt can eat. Provided sprite and meal tray ordered. Pt is painfree.

## 2013-09-11 NOTE — ED Notes (Signed)
Per EMS- [facility] had near syncope episode; staff reports pt had blank stare; No LOC, was diaphoretic after episode. There was no fall. Initially c/o nausea. EKG RBBB. Initial BP 83/50, given 300 cc NS. Current BP 93/52, HR 60, 16RR, 99% RA. Staff reports intermittent confusion which has been over past several days and repetitive questions. Pt is a x 4. Denies any pain. States she is hungry.

## 2013-09-12 ENCOUNTER — Inpatient Hospital Stay (HOSPITAL_COMMUNITY): Payer: Medicare Other

## 2013-09-12 ENCOUNTER — Other Ambulatory Visit: Payer: Self-pay

## 2013-09-12 LAB — BASIC METABOLIC PANEL
BUN: 47 mg/dL — ABNORMAL HIGH (ref 6–23)
CO2: 21 meq/L (ref 19–32)
Calcium: 8.7 mg/dL (ref 8.4–10.5)
Chloride: 103 mEq/L (ref 96–112)
Creatinine, Ser: 1.26 mg/dL — ABNORMAL HIGH (ref 0.50–1.10)
GFR calc Af Amer: 45 mL/min — ABNORMAL LOW (ref >90)
GFR, EST NON AFRICAN AMERICAN: 39 mL/min — AB (ref >90)
Glucose, Bld: 87 mg/dL (ref 70–99)
POTASSIUM: 4.7 meq/L (ref 3.7–5.3)
SODIUM: 139 meq/L (ref 137–147)

## 2013-09-12 LAB — LIPID PANEL
CHOLESTEROL: 125 mg/dL (ref 0–200)
HDL: 46 mg/dL (ref >39)
LDL CALC: 63 mg/dL (ref 0–99)
TRIGLYCERIDES: 81 mg/dL (ref <150)
Total CHOL/HDL Ratio: 2.7 RATIO
VLDL: 16 mg/dL (ref 0–40)

## 2013-09-12 LAB — CBC
HCT: 26 % — ABNORMAL LOW (ref 36.0–46.0)
Hemoglobin: 8.6 g/dL — ABNORMAL LOW (ref 12.0–15.0)
MCH: 21.6 pg — ABNORMAL LOW (ref 26.0–34.0)
MCHC: 33.1 g/dL (ref 30.0–36.0)
MCV: 65.3 fL — ABNORMAL LOW (ref 78.0–100.0)
Platelets: 228 10*3/uL (ref 150–400)
RBC: 3.98 MIL/uL (ref 3.87–5.11)
RDW: 18.4 % — ABNORMAL HIGH (ref 11.5–15.5)
WBC: 5.3 10*3/uL (ref 4.0–10.5)

## 2013-09-12 LAB — TROPONIN I: TROPONIN I: 0.55 ng/mL — AB (ref <0.30)

## 2013-09-12 LAB — T4, FREE: Free T4: 0.93 ng/dL (ref 0.80–1.80)

## 2013-09-12 MED ORDER — TECHNETIUM TC 99M SESTAMIBI GENERIC - CARDIOLITE
30.0000 | Freq: Once | INTRAVENOUS | Status: AC | PRN
  Administered 2013-09-12: 30 via INTRAVENOUS

## 2013-09-12 MED ORDER — REGADENOSON 0.4 MG/5ML IV SOLN
0.4000 mg | Freq: Once | INTRAVENOUS | Status: AC
  Administered 2013-09-12: 0.4 mg via INTRAVENOUS
  Filled 2013-09-12: qty 5

## 2013-09-12 MED ORDER — SODIUM CHLORIDE 0.9 % IV BOLUS (SEPSIS)
250.0000 mL | Freq: Once | INTRAVENOUS | Status: AC
  Administered 2013-09-12: 250 mL via INTRAVENOUS

## 2013-09-12 MED ORDER — TECHNETIUM TC 99M SESTAMIBI GENERIC - CARDIOLITE
10.0000 | Freq: Once | INTRAVENOUS | Status: AC | PRN
  Administered 2013-09-12: 10 via INTRAVENOUS

## 2013-09-12 MED ORDER — REGADENOSON 0.4 MG/5ML IV SOLN
INTRAVENOUS | Status: AC
  Administered 2013-09-12: 0.4 mg via INTRAVENOUS
  Filled 2013-09-12: qty 5

## 2013-09-12 MED ORDER — SODIUM CHLORIDE 0.9 % IV SOLN
Freq: Once | INTRAVENOUS | Status: AC
  Administered 2013-09-12: 09:00:00 via INTRAVENOUS

## 2013-09-12 NOTE — Progress Notes (Signed)
NS stopped BP 120/53. Lexiscan complete. Had 150cc NS

## 2013-09-12 NOTE — Progress Notes (Signed)
Utilization review completed. Junie Avilla, RN, BSN. 

## 2013-09-12 NOTE — Progress Notes (Signed)
Subjective:  Patient denies any chest pain or shortness of breath. No further episodes of syncope. Seen in the medicine department. Troponin I. remains minimally elevated  Objective:  Vital Signs in the last 24 hours: Temp:  [98 F (36.7 C)-99 F (37.2 C)] 98.9 F (37.2 C) (04/15 0500) Pulse Rate:  [67-73] 67 (04/15 0500) Resp:  [12-23] 12 (04/15 0500) BP: (75-120)/(42-59) 98/50 mmHg (04/15 1142) SpO2:  [96 %-100 %] 98 % (04/15 0500) Weight:  [51.619 kg (113 lb 12.8 oz)] 51.619 kg (113 lb 12.8 oz) (04/14 1615)  Intake/Output from previous day: 04/14 0701 - 04/15 0700 In: 360 [P.O.:360] Out: -  Intake/Output from this shift:    Physical Exam: Neck: no adenopathy, no carotid bruit, no JVD and supple, symmetrical, trachea midline Lungs: clear to auscultation bilaterally Heart: regular rate and rhythm, S1, S2 normal and 2/6 systolic murmur noted Abdomen: soft, non-tender; bowel sounds normal; no masses,  no organomegaly Extremities: extremities normal, atraumatic, no cyanosis or edema  Lab Results:  Recent Labs  09/11/13 1820 09/12/13 0535  WBC 6.2 5.3  HGB 9.6* 8.6*  PLT 236 228    Recent Labs  09/11/13 1820 09/12/13 0535  NA 137 139  K 4.3 4.7  CL 100 103  CO2 23 21  GLUCOSE 128* 87  BUN 42* 47*  CREATININE 1.14* 1.26*    Recent Labs  09/11/13 2300 09/12/13 0535  TROPONINI 0.50* 0.55*   Hepatic Function Panel  Recent Labs  09/11/13 1820  PROT 7.6  ALBUMIN 3.3*  AST 32  ALT 13  ALKPHOS 71  BILITOT 0.4    Recent Labs  09/12/13 0535  CHOL 125   No results found for this basename: PROTIME,  in the last 72 hours  Imaging: Imaging results have been reviewed and No results found.  Cardiac Studies:  Assessment/Plan:  Status post syncope rule out cardiac arrhythmias  Probable very small non-Q-wave myocardial infarction Mild coronary artery disease in the past  Hypertension  GERD  Dementia  Anemia of chronic disease  History of syncope  in the past  Compensated congestive heart failure secondary to preserved LV systolic function/valvular heart disease  History of questionable seizure disorder  Plan Continue present management Schedule for nuclear stress test today  LOS: 1 day    Robynn Pane 09/12/2013, 12:08 PM

## 2013-09-12 NOTE — ED Provider Notes (Signed)
80 y.o. Female with dementia presents with syncopal episode.  EKG nonischemic with troponin elevated here.   PE VSS Lungs- cta c-v-  rrr   I performed a history and physical examination of [name] and discussed her management with Dr. Aundria Rud.  I agree with the history, physical, assessment, and plan of care, with the following exceptions: None  I was present for the following procedures: None Time Spent in Critical Care of the patient: None Time spent in discussions with the patient and family: 5  [name] S [name]      Hilario Quarry, MD 09/12/13 [phone]

## 2013-09-12 NOTE — Progress Notes (Signed)
pts BP 88/50 manually; pt states she feels tired and sleepy. Dr.Harwani paged and made aware, order received for 250cc bolus; metoprolol held; will re-check BP once bolus done

## 2013-09-12 NOTE — Progress Notes (Signed)
pts bolus finished, BP 98/50 manually; pt states she feels fine now and not tired or sleepy

## 2013-09-13 MED ORDER — FERROUS GLUCONATE 324 (38 FE) MG PO TABS
324.0000 mg | ORAL_TABLET | Freq: Every day | ORAL | Status: DC
Start: 2013-09-13 — End: 2014-09-06

## 2013-09-13 NOTE — Discharge Summary (Signed)
  Discharge summary dictated on 09/13/2013 dictation number is [phone]

## 2013-09-13 NOTE — Discharge Instructions (Signed)
Anemia, Nonspecific Anemia is a condition in which the concentration of red blood cells or hemoglobin in the blood is below normal. Hemoglobin is a substance in red blood cells that carries oxygen to the tissues of the body. Anemia results in not enough oxygen reaching these tissues.  CAUSES  Common causes of anemia include:   Excessive bleeding. Bleeding may be internal or external. This includes excessive bleeding from periods (in women) or from the intestine.   Poor nutrition.   Chronic kidney, thyroid, and liver disease.  Bone marrow disorders that decrease red blood cell production.  Cancer and treatments for cancer.  HIV, AIDS, and their treatments.  Spleen problems that increase red blood cell destruction.  Blood disorders.  Excess destruction of red blood cells due to infection, medicines, and autoimmune disorders. SIGNS AND SYMPTOMS   Minor weakness.   Dizziness.   Headache.  Palpitations.   Shortness of breath, especially with exercise.   Paleness.  Cold sensitivity.  Indigestion.  Nausea.  Difficulty sleeping.  Difficulty concentrating. Symptoms may occur suddenly or they may develop slowly.  DIAGNOSIS  Additional blood tests are often needed. These help your health care provider determine the best treatment. Your health care provider will check your stool for blood and look for other causes of blood loss.  TREATMENT  Treatment varies depending on the cause of the anemia. Treatment can include:   Supplements of iron, vitamin B12, or folic acid.   Hormone medicines.   A blood transfusion. This may be needed if blood loss is severe.   Hospitalization. This may be needed if there is significant continual blood loss.   Dietary changes.  Spleen removal. HOME CARE INSTRUCTIONS Keep all follow-up appointments. It often takes many weeks to correct anemia, and having your health care provider check on your condition and your response to  treatment is very important. SEEK IMMEDIATE MEDICAL CARE IF:   You develop extreme weakness, shortness of breath, or chest pain.   You become dizzy or have trouble concentrating.  You develop heavy vaginal bleeding.   You develop a rash.   You have bloody or black, tarry stools.   You faint.   You vomit up blood.   You vomit repeatedly.   You have abdominal pain.  You have a fever or persistent symptoms for more than 2 3 days.   You have a fever and your symptoms suddenly get worse.   You are dehydrated.  MAKE SURE YOU:  Understand these instructions.  Will watch your condition.  Will get help right away if you are not doing well or get worse. Document Released: 06/24/2004 Document Revised: 01/17/2013 Document Reviewed: 11/10/2012 [facility] Patient Information 2014 [place], [place]. Syncope Syncope is a fainting spell. This means the person loses consciousness and drops to the ground. The person is generally unconscious for less than 5 minutes. The person may have some muscle twitches for up to 15 seconds before waking up and returning to normal. Syncope occurs more often in elderly people, but it can happen to anyone. While most causes of syncope are not dangerous, syncope can be a sign of a serious medical problem. It is important to seek medical care.  CAUSES  Syncope is caused by a sudden decrease in blood flow to the brain. The specific cause is often not determined. Factors that can trigger syncope include:  Taking medicines that lower blood pressure.  Sudden changes in posture, such as standing up suddenly.  Taking more medicine than prescribed.  Standing in one place for too long.  Seizure disorders.  Dehydration and excessive exposure to heat.  Low blood sugar (hypoglycemia).  Straining to have a bowel movement.  Heart disease, irregular heartbeat, or other circulatory problems.  Fear, emotional distress, seeing blood, or severe  pain. SYMPTOMS  Right before fainting, you may:  Feel dizzy or lightheaded.  Feel nauseous.  See all white or all black in your field of vision.  Have cold, clammy skin. DIAGNOSIS  Your caregiver will ask about your symptoms, perform a physical exam, and perform electrocardiography (ECG) to record the electrical activity of your heart. Your caregiver may also perform other heart or blood tests to determine the cause of your syncope. TREATMENT  In most cases, no treatment is needed. Depending on the cause of your syncope, your caregiver may recommend changing or stopping some of your medicines. HOME CARE INSTRUCTIONS  Have someone stay with you until you feel stable.  Do not drive, operate machinery, or play sports until your caregiver says it is okay.  Keep all follow-up appointments as directed by your caregiver.  Lie down right away if you start feeling like you might faint. Breathe deeply and steadily. Wait until all the symptoms have passed.  Drink enough fluids to keep your urine clear or pale yellow.  If you are taking blood pressure or heart medicine, get up slowly, taking several minutes to sit and then stand. This can reduce dizziness. SEEK IMMEDIATE MEDICAL CARE IF:   You have a severe headache.  You have unusual pain in the chest, abdomen, or back.  You are bleeding from the mouth or rectum, or you have black or tarry stool.  You have an irregular or very fast heartbeat.  You have pain with breathing.  You have repeated fainting or seizure-like jerking during an episode.  You faint when sitting or lying down.  You have confusion.  You have difficulty walking.  You have severe weakness.  You have vision problems. If you fainted, call your local emergency services (911 in [place] Do not drive yourself to the hospital.  MAKE SURE YOU:  Understand these instructions.  Will watch your condition.  Will get help right away if you are not doing well or get  worse. Document Released: 05/17/2005 Document Revised: 11/16/2011 Document Reviewed: 07/16/2011 [facility] Patient Information 2014 [place].

## 2013-09-14 NOTE — Discharge Summary (Signed)
[name]:  [name], [name]               ACCOUNT NO.:  [id number]  MEDICAL RECORD NO.:  [id number]  LOCATION:  [id number]                        FACILITY:  [facility]  PHYSICIAN:  Etoile Looman N. Sharyn LullHarwani, M.D. DATE OF BIRTH:  1929/10/10  DATE OF ADMISSION:  09/11/2013 DATE OF DISCHARGE:  09/13/2013                              DISCHARGE SUMMARY   ADMITTING DIAGNOSES: 1. Status post questionable syncope, rule out cardiac arrhythmias,     minimally elevated troponin I, rule out myocardial infarction. 2. Mild coronary artery disease in the past. 3. Hypertension. 4. Gastroesophageal reflux disease. 5. Dementia. 6. Anemia of chronic disease. 7. History of syncope in the past, workup negative. 8. Compensated congestive heart failure secondary to preserved left     ventricular systolic function/valvular heart disease. 9. History of questionable seizure disorder.  FINAL DIAGNOSES: 1. Status post syncope.  No cardiac arrhythmias noted on the monitor     in last 24 hours, status post probable very small non-Q-wave     myocardial infarction.  Negative nuclear stress test. 2. Possible small vessel disease. 3. Mild epicardial coronary artery disease in the past. 4. Hypertension. 5. Gastroesophageal reflux disease. 6. Dementia. 7. Anemia of chronic disease. 8. History of syncope in the past, workup negative. 9. Compensated congestive heart failure secondary to preserved left     ventricular systolic function/valvular heart disease. 10.History of questionable seizure disorder.  DISCHARGE HOME MEDICATIONS:  Ferrous gluconate 324 mg 1 tablet daily with breakfast, aspirin 81 mg 1 tablet daily, Aricept 5 mg daily, Ensure 1 can twice daily as before, lisinopril 2.5 mg daily, multivitamin with minerals 1 tablet daily, omeprazole 20 mg 1 capsule daily, MiraLAX 17 g every other day as needed, simvastatin 20 mg daily.  The patient has been advised to stop furosemide and potassium chloride for now.  DIET:  Low-salt,  low-cholesterol.  ACTIVITY:  Increase activity slowly as tolerated.  CONDITION AT DISCHARGE:  Stable.  BRIEF HISTORY AND HOSPITAL COURSE:  Ms. Monica PeaHarris is an 80 year old female with past medical history significant for multiple medical problems, i.e., coronary artery disease, history of non-Q-wave myocardial infarction in the past with minimally elevated troponin I, subsequently had cardiac cath, and was noted to have mild epicardial coronary artery disease, hypertension, GERD, dementia, anemia of chronic disease, history of recurrent syncope, workup negative in the past including loop recorder, compensated congestive heart failure secondary to diastolic dysfunction and valvular heart disease, history of questionable seizure disorder, came to ER after sudden onset of blank stare and questionable syncopal episode for few seconds.  The patient denies any palpitation, lightheadedness.  Denies any chest pain, but felt nauseous.  Denies any seizure activity.  Patient now complaining of left shoulder and neck pain.  The patient denies any weakness in the arms or legs.  Denies any slurred speech.  She was taken to the ER which showed normal sinus rhythm with right bundle-branch block, and left posterior fascicular block, and was noted to have minimally elevated troponin I.  The patient had similar presentation with minimally elevated troponin I in the past, as opposed subsequently had cardiac cath, which showed mild epicardial coronary artery disease.  PAST MEDICAL HISTORY:  As above.  PHYSICAL  EXAMINATION:  GENERAL:  She was alert, awake, oriented x3. VITAL SIGNS:  Blood pressure was 102/58, pulse was 69, temperature 98. HEENT:  Conjunctivae was pink. NECK:  Supple.  No JVD.  No bruit. LUNGS:  Clear to auscultation without rhonchi or rales. CARDIOVASCULAR:  S1, S2 was normal.  There was 2/6 systolic murmur.  No S3 gallop. ABDOMEN:  Soft.  Bowel sounds were present.   Nontender. EXTREMITIES:  There is no clubbing, cyanosis, or edema.  LABORATORY DATA:  Sodium was 137, potassium 4.7, BUN 41, creatinine 1.14.  Troponin I was slightly elevated 0.43, 0.40, 0.50, 0.55, hemoglobin was 10.7, hematocrit 32, white count of 5.7.  Cholesterol was 125, triglycerides 81, HDL 46, LDL 63.  Her TSH was 2.45.  The patient had an elective scan Myoview which showed no evidence of pharmacological- induced myocardial ischemia.  The EF of 53%.  There was no regional wall motion abnormalities.  BRIEF HOSPITAL COURSE:  The patient was admitted to telemetry unit.  The patient ruled in for possible small non-Q-wave MI due to elevated troponin I, although patient did not have any episodes of chest pain during the hospital stay.  The patient subsequently underwent Lexiscan Myoview which showed no evidence of reversible ischemia.  The patient had episodes of hypotension requiring IV fluid bolus and holding of blood pressure medicine.  Her Lasix and K-Dur has been discontinued. The patient is about walking in the room without any problems.  The patient will be discharged home on above medications and will be followed up in my office in 1 week.  We will repeat her CBC as outpatient and she will be referred to GI as outpatient for further work up as needed for chronic anemia.     Eduardo Osier. Sharyn Lull, M.D.     [facility]  D:  09/13/2013  T:  09/14/2013  Job:  [id number]

## 2013-12-13 NOTE — Telephone Encounter (Signed)
Close Encounter 

## 2014-05-08 ENCOUNTER — Encounter: Payer: Self-pay | Admitting: *Deleted

## 2014-05-09 ENCOUNTER — Encounter (HOSPITAL_COMMUNITY): Payer: Self-pay | Admitting: Cardiology

## 2014-05-27 ENCOUNTER — Emergency Department (HOSPITAL_COMMUNITY): Payer: Medicare Other

## 2014-05-27 ENCOUNTER — Encounter (HOSPITAL_COMMUNITY): Payer: Self-pay | Admitting: Emergency Medicine

## 2014-05-27 ENCOUNTER — Inpatient Hospital Stay (HOSPITAL_COMMUNITY)
Admission: EM | Admit: 2014-05-27 | Discharge: 2014-06-05 | DRG: 408 | Disposition: A | Discharge status: Skilled Nursing Facility | Payer: Medicare Other | Attending: Cardiology | Admitting: Cardiology

## 2014-05-27 ENCOUNTER — Other Ambulatory Visit: Payer: Self-pay

## 2014-05-27 DIAGNOSIS — Z885 Allergy status to narcotic agent status: Secondary | ICD-10-CM

## 2014-05-27 DIAGNOSIS — I10 Essential (primary) hypertension: Secondary | ICD-10-CM | POA: Diagnosis present

## 2014-05-27 DIAGNOSIS — I5022 Chronic systolic (congestive) heart failure: Secondary | ICD-10-CM | POA: Diagnosis present

## 2014-05-27 DIAGNOSIS — Z8249 Family history of ischemic heart disease and other diseases of the circulatory system: Secondary | ICD-10-CM | POA: Diagnosis not present

## 2014-05-27 DIAGNOSIS — I4891 Unspecified atrial fibrillation: Secondary | ICD-10-CM | POA: Diagnosis present

## 2014-05-27 DIAGNOSIS — I428 Other cardiomyopathies: Secondary | ICD-10-CM | POA: Diagnosis present

## 2014-05-27 DIAGNOSIS — R000 Tachycardia, unspecified: Secondary | ICD-10-CM

## 2014-05-27 DIAGNOSIS — E43 Unspecified severe protein-calorie malnutrition: Secondary | ICD-10-CM | POA: Diagnosis present

## 2014-05-27 DIAGNOSIS — Z87891 Personal history of nicotine dependence: Secondary | ICD-10-CM

## 2014-05-27 DIAGNOSIS — F0390 Unspecified dementia without behavioral disturbance: Secondary | ICD-10-CM | POA: Diagnosis present

## 2014-05-27 DIAGNOSIS — K819 Cholecystitis, unspecified: Secondary | ICD-10-CM | POA: Diagnosis present

## 2014-05-27 DIAGNOSIS — K859 Acute pancreatitis, unspecified: Secondary | ICD-10-CM | POA: Diagnosis present

## 2014-05-27 DIAGNOSIS — I472 Ventricular tachycardia: Secondary | ICD-10-CM | POA: Diagnosis not present

## 2014-05-27 DIAGNOSIS — E780 Pure hypercholesterolemia: Secondary | ICD-10-CM | POA: Diagnosis present

## 2014-05-27 DIAGNOSIS — K8000 Calculus of gallbladder with acute cholecystitis without obstruction: Secondary | ICD-10-CM | POA: Diagnosis present

## 2014-05-27 DIAGNOSIS — I959 Hypotension, unspecified: Secondary | ICD-10-CM | POA: Diagnosis present

## 2014-05-27 DIAGNOSIS — Z823 Family history of stroke: Secondary | ICD-10-CM

## 2014-05-27 DIAGNOSIS — I248 Other forms of acute ischemic heart disease: Secondary | ICD-10-CM | POA: Diagnosis present

## 2014-05-27 DIAGNOSIS — Z23 Encounter for immunization: Secondary | ICD-10-CM | POA: Diagnosis not present

## 2014-05-27 DIAGNOSIS — K5900 Constipation, unspecified: Secondary | ICD-10-CM | POA: Diagnosis not present

## 2014-05-27 DIAGNOSIS — Z7982 Long term (current) use of aspirin: Secondary | ICD-10-CM

## 2014-05-27 DIAGNOSIS — Z681 Body mass index (BMI) 19 or less, adult: Secondary | ICD-10-CM | POA: Diagnosis not present

## 2014-05-27 DIAGNOSIS — Z8673 Personal history of transient ischemic attack (TIA), and cerebral infarction without residual deficits: Secondary | ICD-10-CM | POA: Diagnosis not present

## 2014-05-27 DIAGNOSIS — R0602 Shortness of breath: Secondary | ICD-10-CM

## 2014-05-27 DIAGNOSIS — D649 Anemia, unspecified: Secondary | ICD-10-CM | POA: Diagnosis present

## 2014-05-27 DIAGNOSIS — E876 Hypokalemia: Secondary | ICD-10-CM | POA: Diagnosis not present

## 2014-05-27 DIAGNOSIS — R1013 Epigastric pain: Secondary | ICD-10-CM | POA: Diagnosis present

## 2014-05-27 DIAGNOSIS — I2510 Atherosclerotic heart disease of native coronary artery without angina pectoris: Secondary | ICD-10-CM | POA: Diagnosis present

## 2014-05-27 DIAGNOSIS — K219 Gastro-esophageal reflux disease without esophagitis: Secondary | ICD-10-CM | POA: Diagnosis present

## 2014-05-27 DIAGNOSIS — R109 Unspecified abdominal pain: Secondary | ICD-10-CM

## 2014-05-27 DIAGNOSIS — I252 Old myocardial infarction: Secondary | ICD-10-CM | POA: Diagnosis not present

## 2014-05-27 LAB — CBC WITH DIFFERENTIAL/PLATELET
BASOS PCT: 0 % (ref 0–1)
BASOS PCT: 0 % (ref 0–1)
Basophils Absolute: 0 10*3/uL (ref 0.0–0.1)
Basophils Absolute: 0 10*3/uL (ref 0.0–0.1)
EOS PCT: 0 % (ref 0–5)
EOS PCT: 0 % (ref 0–5)
Eosinophils Absolute: 0 10*3/uL (ref 0.0–0.7)
Eosinophils Absolute: 0 10*3/uL (ref 0.0–0.7)
HCT: 38.7 % (ref 36.0–46.0)
HCT: 30.9 % — ABNORMAL LOW (ref 36.0–46.0)
HEMOGLOBIN: 12.8 g/dL (ref 12.0–15.0)
HEMOGLOBIN: 10.2 g/dL — AB (ref 12.0–15.0)
LYMPHS PCT: 14 % (ref 12–46)
Lymphocytes Relative: 12 % (ref 12–46)
Lymphs Abs: 1.1 10*3/uL (ref 0.7–4.0)
Lymphs Abs: 0.9 10*3/uL (ref 0.7–4.0)
MCH: 22.3 pg — ABNORMAL LOW (ref 26.0–34.0)
MCH: 22.9 pg — AB (ref 26.0–34.0)
MCHC: 33.1 g/dL (ref 30.0–36.0)
MCHC: 33 g/dL (ref 30.0–36.0)
MCV: 67.4 fL — ABNORMAL LOW (ref 78.0–100.0)
MCV: 69.3 fL — ABNORMAL LOW (ref 78.0–100.0)
MONO ABS: 0.7 10*3/uL (ref 0.1–1.0)
MONO ABS: 0.7 10*3/uL (ref 0.1–1.0)
MONOS PCT: 10 % (ref 3–12)
Monocytes Relative: 8 % (ref 3–12)
NEUTROS PCT: 80 % — AB (ref 43–77)
Neutro Abs: 7 10*3/uL (ref 1.7–7.7)
Neutro Abs: 5.1 10*3/uL (ref 1.7–7.7)
Neutrophils Relative %: 76 % (ref 43–77)
Platelets: 195 10*3/uL (ref 150–400)
Platelets: 159 10*3/uL (ref 150–400)
RBC: 5.74 MIL/uL — AB (ref 3.87–5.11)
RBC: 4.46 MIL/uL (ref 3.87–5.11)
RDW: 21.5 % — ABNORMAL HIGH (ref 11.5–15.5)
RDW: 21.3 % — ABNORMAL HIGH (ref 11.5–15.5)
WBC: 8.8 10*3/uL (ref 4.0–10.5)
WBC: 6.7 10*3/uL (ref 4.0–10.5)
nRBC: 22 /100 WBC — ABNORMAL HIGH

## 2014-05-27 LAB — COMPREHENSIVE METABOLIC PANEL
ALBUMIN: 4 g/dL (ref 3.5–5.2)
ALBUMIN: 3 g/dL — AB (ref 3.5–5.2)
ALK PHOS: 126 U/L — AB (ref 39–117)
ALT: 579 U/L — ABNORMAL HIGH (ref 0–35)
ALT: 431 U/L — ABNORMAL HIGH (ref 0–35)
AST: 420 U/L — ABNORMAL HIGH (ref 0–37)
AST: 271 U/L — AB (ref 0–37)
Alkaline Phosphatase: 167 U/L — ABNORMAL HIGH (ref 39–117)
Anion gap: 12 (ref 5–15)
Anion gap: 10 (ref 5–15)
BILIRUBIN TOTAL: 2.8 mg/dL — AB (ref 0.3–1.2)
BUN: 49 mg/dL — ABNORMAL HIGH (ref 6–23)
BUN: 52 mg/dL — ABNORMAL HIGH (ref 6–23)
CALCIUM: 7.9 mg/dL — AB (ref 8.4–10.5)
CHLORIDE: 109 meq/L (ref 96–112)
CO2: 20 mmol/L (ref 19–32)
CO2: 18 mmol/L — ABNORMAL LOW (ref 19–32)
CREATININE: 1.14 mg/dL — AB (ref 0.50–1.10)
Calcium: 8.8 mg/dL (ref 8.4–10.5)
Chloride: 112 mEq/L (ref 96–112)
Creatinine, Ser: 1.18 mg/dL — ABNORMAL HIGH (ref 0.50–1.10)
GFR calc Af Amer: 50 mL/min — ABNORMAL LOW (ref >90)
GFR calc non Af Amer: 44 mL/min — ABNORMAL LOW (ref >90)
GFR calc non Af Amer: 42 mL/min — ABNORMAL LOW (ref >90)
GFR, EST AFRICAN AMERICAN: 48 mL/min — AB (ref >90)
GLUCOSE: 96 mg/dL (ref 70–99)
Glucose, Bld: 147 mg/dL — ABNORMAL HIGH (ref 70–99)
POTASSIUM: 3.5 mmol/L (ref 3.5–5.1)
Potassium: 3.3 mmol/L — ABNORMAL LOW (ref 3.5–5.1)
SODIUM: 140 mmol/L (ref 135–145)
Sodium: 141 mmol/L (ref 135–145)
TOTAL PROTEIN: 6.7 g/dL (ref 6.0–8.3)
Total Bilirubin: 1.4 mg/dL — ABNORMAL HIGH (ref 0.3–1.2)
Total Protein: 9.5 g/dL — ABNORMAL HIGH (ref 6.0–8.3)

## 2014-05-27 LAB — URINE MICROSCOPIC-ADD ON

## 2014-05-27 LAB — I-STAT ARTERIAL BLOOD GAS, ED
Acid-base deficit: 8 mmol/L — ABNORMAL HIGH (ref 0.0–2.0)
Bicarbonate: 17.4 mEq/L — ABNORMAL LOW (ref 20.0–24.0)
O2 Saturation: 98 %
PCO2 ART: 34.2 mmHg — AB (ref 35.0–45.0)
PH ART: 7.315 — AB (ref 7.350–7.450)
Patient temperature: 98.6
TCO2: 18 mmol/L (ref 0–100)
pO2, Arterial: 119 mmHg — ABNORMAL HIGH (ref 80.0–100.0)

## 2014-05-27 LAB — BRAIN NATRIURETIC PEPTIDE: B Natriuretic Peptide: 3077.1 pg/mL — ABNORMAL HIGH (ref 0.0–100.0)

## 2014-05-27 LAB — URINALYSIS, ROUTINE W REFLEX MICROSCOPIC
GLUCOSE, UA: NEGATIVE mg/dL
Hgb urine dipstick: NEGATIVE
KETONES UR: NEGATIVE mg/dL
LEUKOCYTES UA: NEGATIVE
Nitrite: NEGATIVE
PH: 5 (ref 5.0–8.0)
Protein, ur: 30 mg/dL — AB
SPECIFIC GRAVITY, URINE: 1.039 — AB (ref 1.005–1.030)
Urobilinogen, UA: 2 mg/dL — ABNORMAL HIGH (ref 0.0–1.0)

## 2014-05-27 LAB — LACTIC ACID, PLASMA: LACTIC ACID, VENOUS: 1.7 mmol/L (ref 0.5–2.2)

## 2014-05-27 LAB — LIPASE, BLOOD: LIPASE: 67 U/L — AB (ref 11–59)

## 2014-05-27 LAB — I-STAT TROPONIN, ED: Troponin i, poc: 1.42 ng/mL (ref 0.00–0.08)

## 2014-05-27 LAB — TROPONIN I
TROPONIN I: 1.3 ng/mL — AB (ref <0.031)
TROPONIN I: 1.19 ng/mL — AB (ref <0.031)

## 2014-05-27 LAB — TSH: TSH: 1.589 u[IU]/mL (ref 0.350–4.500)

## 2014-05-27 MED ORDER — SODIUM CHLORIDE 0.9 % IV BOLUS (SEPSIS): 1000.0000 mL | Freq: Once | INTRAVENOUS | Status: DC

## 2014-05-27 MED ORDER — ASPIRIN 300 MG RE SUPP: 300.0000 mg | RECTAL | Status: DC

## 2014-05-27 MED ORDER — IOHEXOL 300 MG/ML  SOLN
25.0000 mL | Freq: Once | INTRAMUSCULAR | Status: AC | PRN
  Administered 2014-05-27: 25 mL via ORAL

## 2014-05-27 MED ORDER — ACETAMINOPHEN 325 MG PO TABS
650.0000 mg | ORAL_TABLET | ORAL | Status: DC | PRN
  Administered 2014-05-28 – 2014-05-30 (×9): 650 mg via ORAL
  Filled 2014-05-27 (×9): qty 2

## 2014-05-27 MED ORDER — HEPARIN BOLUS VIA INFUSION
1000.0000 [IU] | Freq: Once | INTRAVENOUS | Status: AC
  Administered 2014-05-27: 1000 [IU] via INTRAVENOUS
  Filled 2014-05-27: qty 1000

## 2014-05-27 MED ORDER — PIPERACILLIN-TAZOBACTAM 3.375 G IVPB 30 MIN
3.3750 g | Freq: Once | INTRAVENOUS | Status: AC
  Administered 2014-05-27: 3.375 g via INTRAVENOUS
  Filled 2014-05-27: qty 50

## 2014-05-27 MED ORDER — SODIUM CHLORIDE 0.9 % IV SOLN
INTRAVENOUS | Status: DC
  Administered 2014-05-27 – 2014-06-02 (×5): via INTRAVENOUS

## 2014-05-27 MED ORDER — PIPERACILLIN-TAZOBACTAM 3.375 G IVPB
3.3750 g | Freq: Three times a day (TID) | INTRAVENOUS | Status: DC
  Administered 2014-05-27 – 2014-06-04 (×23): 3.375 g via INTRAVENOUS
  Filled 2014-05-27 (×28): qty 50

## 2014-05-27 MED ORDER — IOHEXOL 300 MG/ML  SOLN
80.0000 mL | Freq: Once | INTRAMUSCULAR | Status: AC | PRN
  Administered 2014-05-27: 80 mL via INTRAVENOUS

## 2014-05-27 MED ORDER — SODIUM CHLORIDE 0.9 % IV BOLUS (SEPSIS)
250.0000 mL | Freq: Once | INTRAVENOUS | Status: AC
  Administered 2014-05-27: 250 mL via INTRAVENOUS

## 2014-05-27 MED ORDER — HEPARIN (PORCINE) IN NACL 100-0.45 UNIT/ML-% IJ SOLN
800.0000 [IU]/h | INTRAMUSCULAR | Status: DC
  Administered 2014-05-27: 650 [IU]/h via INTRAVENOUS
  Administered 2014-05-28 – 2014-05-29 (×2): 800 [IU]/h via INTRAVENOUS
  Filled 2014-05-27 (×3): qty 250

## 2014-05-27 MED ORDER — ASPIRIN EC 81 MG PO TBEC: 81.0000 mg | DELAYED_RELEASE_TABLET | Freq: Every day | ORAL | Status: DC

## 2014-05-27 MED ORDER — ONDANSETRON HCL 4 MG/2ML IJ SOLN: 4.0000 mg | Freq: Four times a day (QID) | INTRAMUSCULAR | Status: DC | PRN

## 2014-05-27 MED ORDER — DILTIAZEM HCL 100 MG IV SOLR
5.0000 mg/h | INTRAVENOUS | Status: DC
  Administered 2014-05-27 – 2014-05-28 (×2): 5 mg/h via INTRAVENOUS
  Filled 2014-05-27: qty 100

## 2014-05-27 MED ORDER — ASPIRIN EC 81 MG PO TBEC
81.0000 mg | DELAYED_RELEASE_TABLET | Freq: Every day | ORAL | Status: DC
  Administered 2014-05-28 – 2014-06-05 (×9): 81 mg via ORAL
  Filled 2014-05-27 (×9): qty 1

## 2014-05-27 MED ORDER — PANTOPRAZOLE SODIUM 40 MG IV SOLR
40.0000 mg | INTRAVENOUS | Status: DC
  Administered 2014-05-27 – 2014-06-02 (×7): 40 mg via INTRAVENOUS
  Filled 2014-05-27 (×8): qty 40

## 2014-05-27 MED ORDER — ONDANSETRON HCL 4 MG/2ML IJ SOLN
4.0000 mg | Freq: Once | INTRAMUSCULAR | Status: AC
  Administered 2014-05-27: 4 mg via INTRAVENOUS
  Filled 2014-05-27: qty 2

## 2014-05-27 MED ORDER — DILTIAZEM LOAD VIA INFUSION
10.0000 mg | Freq: Once | INTRAVENOUS | Status: DC
  Filled 2014-05-27: qty 10

## 2014-05-27 MED ORDER — SODIUM CHLORIDE 0.9 % IV BOLUS (SEPSIS)
1000.0000 mL | Freq: Once | INTRAVENOUS | Status: AC
  Administered 2014-05-27: 1000 mL via INTRAVENOUS

## 2014-05-27 MED ORDER — NITROGLYCERIN 0.4 MG SL SUBL: 0.4000 mg | SUBLINGUAL_TABLET | SUBLINGUAL | Status: DC | PRN

## 2014-05-27 MED ORDER — PNEUMOCOCCAL VAC POLYVALENT 25 MCG/0.5ML IJ INJ
0.5000 mL | INJECTION | INTRAMUSCULAR | Status: DC
  Filled 2014-05-27: qty 0.5

## 2014-05-27 MED ORDER — INFLUENZA VAC SPLIT QUAD 0.5 ML IM SUSY
0.5000 mL | PREFILLED_SYRINGE | INTRAMUSCULAR | Status: DC
Start: 2014-05-28 — End: 2014-05-29
  Filled 2014-05-27: qty 0.5

## 2014-05-27 MED ORDER — ASPIRIN 81 MG PO CHEW: 324.0000 mg | CHEWABLE_TABLET | ORAL | Status: DC

## 2014-05-27 MED ORDER — ASPIRIN 81 MG PO CHEW
324.0000 mg | CHEWABLE_TABLET | Freq: Once | ORAL | Status: AC
  Administered 2014-05-27: 324 mg via ORAL
  Filled 2014-05-27: qty 4

## 2014-05-27 NOTE — ED Notes (Signed)
Patient to us at this time.

## 2014-05-27 NOTE — ED Provider Notes (Addendum)
CSN: [id number]     Arrival date & time 05/27/14  4098 History   First MD Initiated Contact with Patient 05/27/14 1015     Chief Complaint  Patient presents with  . Tachycardia  . Abdominal Pain     (Consider location/radiation/quality/duration/timing/severity/associated sxs/prior Treatment) Patient is a 80 y.o. female presenting with abdominal pain.  Abdominal Pain Pain location:  Epigastric Pain quality: sharp   Pain radiates to:  Does not radiate Pain severity:  Severe Onset quality:  Gradual Duration:  3 days Timing:  Constant Progression:  Worsening Chronicity:  New Context comment:  Family reports she has been eating and drinking less for past several days Relieved by:  Nothing Worsened by:  Nothing tried Associated symptoms: diarrhea (x 2, described as brown and watery), nausea and shortness of breath   Associated symptoms: no chest pain, no fever and no vomiting     Past Medical History  Diagnosis Date  . Syncope   . Atypical chest pain     Nonobstructive catheterization; negative Myoview 2011  . CVA 11/23/2008  . HYPERTENSION 11/23/2008  . Coronary artery disease   . 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 Other primary cardiomyopathies   . Dementia 11/23/2008  . Non Q wave myocardial infarction hx    notes 09/11/2013  . GERD (gastroesophageal reflux disease)   . SEIZURE DISORDER 11/23/2008    "she's had blackouts so loop recorder placed" (09/11/2013)  . Arthritis     "hands, legs, back" (09/11/2013)  . Status post placement of implantable loop recorder    Past Surgical History  Procedure Laterality Date  . Loop recorder implant  ?2010  . Colonoscopy    . Eye surgery    . Cataract extraction w/ intraocular lens  implant, bilateral Bilateral   . Laparotomy  11/30/2011    Procedure: EXPLORATORY LAPAROTOMY;  Surgeon: Liz Malady, MD;  Location: [facility] OR;  Service: General;  Laterality: N/A;  Exploratory Laparotomy with Primary Repair Obturator Hernia  .  Cardiac catheterization  10/2011  . Left heart catheterization with coronary angiogram N/A 11/26/2011    Procedure: LEFT HEART CATHETERIZATION WITH CORONARY ANGIOGRAM;  Surgeon: Robynn Pane, MD;  Location: [facility] CATH LAB;  Service: Cardiovascular;  Laterality: N/A;   Family History  Problem Relation Age of Onset  . Heart failure Neg Hx   . Stroke    . Heart disease     History  Substance Use Topics  . Smoking status: Former Smoker    Types: Cigarettes  . Smokeless tobacco: Never Used     Comment: 09/11/2013 "stopped smoking in the 1960's; only puffed occasionally when I did smoke; didn't smoke long"  . Alcohol Use: No   OB History    No data available     Review of Systems  Constitutional: Negative for fever.  Respiratory: Positive for shortness of breath.   Cardiovascular: Negative for chest pain.  Gastrointestinal: Positive for nausea, abdominal pain and diarrhea (x 2, described as brown and watery). Negative for vomiting.  All other systems reviewed and are negative.     Allergies  Codeine  Home Medications   Prior to Admission medications   Medication Sig Start Date End Date Taking? Authorizing Provider  aspirin EC 81 MG EC tablet Take 1 tablet (81 mg total) by mouth daily. 01/22/13   Robynn Pane, MD  donepezil (ARICEPT) 5 MG tablet Take 5 mg by mouth at bedtime.    Historical Provider, MD  feeding supplement (ENSURE COMPLETE)  LIQD Take 237 mLs by mouth 2 (two) times daily between meals. 12/15/11   Robynn Pane, MD  ferrous gluconate (FERGON) 324 MG tablet Take 1 tablet (324 mg total) by mouth daily with breakfast. 09/13/13   Robynn Pane, MD  lisinopril (PRINIVIL,ZESTRIL) 5 MG tablet Take 2.5 mg by mouth daily.    Historical Provider, MD  Multiple Vitamin (MULTIVITAMIN WITH MINERALS) TABS Take 1 tablet by mouth daily.    Historical Provider, MD  omeprazole (PRILOSEC) 20 MG capsule Take 20 mg by mouth daily.    Historical Provider, MD  polyethylene glycol  (MIRALAX / GLYCOLAX) packet Take 17 g by mouth every other day.     Historical Provider, MD  PRESCRIPTION MEDICATION Take 1 tablet by mouth daily. "Like Aspirin, Red tablet"    Historical Provider, MD  simvastatin (ZOCOR) 20 MG tablet Take 20 mg by mouth every evening.    Historical Provider, MD   BP 112/80 mmHg  Pulse 133  Temp(Src) 97.3 F (36.3 C) (Oral)  Resp 24  SpO2 99% Physical Exam  Constitutional: She is oriented to person, place, and time. She appears well-developed and well-nourished. No distress.  Elderly thin  HENT:  Head: Normocephalic and atraumatic.  Eyes: Conjunctivae are normal. Pupils are equal, round, and reactive to light. No scleral icterus.  Neck: Neck supple.  Cardiovascular: Regular rhythm, normal heart sounds and intact distal pulses.  Tachycardia present.   No murmur heard. Pulmonary/Chest: Effort normal and breath sounds normal. No stridor. Tachypnea noted. No respiratory distress. She has no wheezes. She has no rales.  Abdominal: Soft. Bowel sounds are normal. She exhibits no distension. There is tenderness (worsen in epigastrium ). There is no rigidity, no rebound and no guarding.  Musculoskeletal: Normal range of motion.  Neurological: She is alert and oriented to person, place, and time.  Skin: Skin is warm and dry. No rash noted.  Psychiatric: She has a normal mood and affect. Her behavior is normal.  Nursing note and vitals reviewed.   ED Course  CRITICAL CARE Performed by: Orson Gear DAVID Authorized by: Orson Gear DAVID Total critical care time: 50 minutes Critical care time was exclusive of separately billable procedures and treating other patients. Critical care was necessary to treat or prevent imminent or life-threatening deterioration of the following conditions: circulatory failure and sepsis. Critical care was time spent personally by me on the following activities: development of treatment plan with patient or surrogate,  discussions with consultants, evaluation of patient's response to treatment, examination of patient, obtaining history from patient or surrogate, ordering and performing treatments and interventions, ordering and review of laboratory studies, ordering and review of radiographic studies, pulse oximetry, re-evaluation of patient's condition and review of old charts.   (including critical care time) Labs Review Labs Reviewed  BRAIN NATRIURETIC PEPTIDE - Abnormal; Notable for the following:    B Natriuretic Peptide 3077.1 (*)    All other components within normal limits  CBC WITH DIFFERENTIAL - Abnormal; Notable for the following:    RBC 5.74 (*)    MCV 67.4 (*)    MCH 22.3 (*)    RDW 21.5 (*)    Neutrophils Relative % 80 (*)    nRBC 22 (*)    All other components within normal limits  COMPREHENSIVE METABOLIC PANEL - Abnormal; Notable for the following:    Potassium 3.3 (*)    Glucose, Bld 147 (*)    BUN 49 (*)    Creatinine, Ser 1.14 (*)  Total Protein 9.5 (*)    AST 420 (*)    ALT 579 (*)    Alkaline Phosphatase 167 (*)    Total Bilirubin 2.8 (*)    GFR calc non Af Amer 44 (*)    GFR calc Af Amer 50 (*)    All other components within normal limits  URINALYSIS, ROUTINE W REFLEX MICROSCOPIC - Abnormal; Notable for the following:    Specific Gravity, Urine 1.039 (*)    Bilirubin Urine SMALL (*)    Protein, ur 30 (*)    Urobilinogen, UA 2.0 (*)    All other components within normal limits  TROPONIN I - Abnormal; Notable for the following:    Troponin I 1.30 (*)    All other components within normal limits  LIPASE, BLOOD - Abnormal; Notable for the following:    Lipase 67 (*)    All other components within normal limits  URINE MICROSCOPIC-ADD ON - Abnormal; Notable for the following:    Squamous Epithelial / LPF FEW (*)    Bacteria, UA FEW (*)    Casts GRANULAR CAST (*)    All other components within normal limits  I-STAT TROPOININ, ED - Abnormal; Notable for the following:     Troponin i, poc 1.42 (*)    All other components within normal limits  I-STAT ARTERIAL BLOOD GAS, ED - Abnormal; Notable for the following:    pH, Arterial 7.315 (*)    pCO2 arterial 34.2 (*)    pO2, Arterial 119.0 (*)    Bicarbonate 17.4 (*)    Acid-base deficit 8.0 (*)    All other components within normal limits  LACTIC ACID, PLASMA  COMPREHENSIVE METABOLIC PANEL  CBC  LIPASE, BLOOD    Imaging Review Ct Abdomen Pelvis W Contrast  05/27/2014   CLINICAL DATA:  Abdominal pain. Nausea, vomiting. Decreased p.o. intake and shortness of breath.  EXAM: CT ABDOMEN AND PELVIS WITH CONTRAST  TECHNIQUE: Multidetector CT imaging of the abdomen and pelvis was performed using the standard protocol following bolus administration of intravenous contrast.  CONTRAST:  80mL OMNIPAQUE IOHEXOL 300 MG/ML  SOLN  COMPARISON:  CT of the abdomen and pelvis 01/20/2013  FINDINGS: Lower chest: There are fibrotic changes at the lung bases. Heart is markedly enlarged. Coronary artery and mitral annulus calcifications are present. No pericardial effusion.  Upper abdomen: There is a diffusely heterogeneous appearance of the liver without discrete lesions. Findings are consistent with hepatic congestion. No focal abnormality identified within the spleen, pancreas. Bilateral adrenal lesions appear stable over prior studies, measuring 1.4 cm on the right and 3.5 cm on the left. Gallbladder is markedly distended and measures 17 cm in length. There is no CT evidence for stones or pericholecystic fluid.  Gastrointestinal tract: The stomach and small bowel loops are normal in caliber. There is significant stranding surrounding the ascending colon which contains numerous diverticula. Colonic wall in this region is thickened and associated with numerous diverticula. No evidence for perforation. The appendix is well seen and has a normal appearance.  Pelvis: The uterus is surgically absent. No adnexal mass. Adjacent to the left aspect  of the base the bladder there is a small fluid-filled structure of likely representing a small bladder diverticulum.  Retroperitoneum: No retroperitoneal or mesenteric adenopathy.  Abdominal wall: There is diffuse body wall edema. 5.2 x 1.3 cm lipoma identified in the right mid abdominal subcutaneous tissues.  Osseous structures: Scoliosis. No suspicious lytic or blastic lesions are identified.  IMPRESSION: 1. Marked distension  of the gallbladder. Ultrasound may be helpful for further evaluation regarding the duct and possible gallstones. 2. Hepatic congestion with diffusely mottled appearance of the liver parenchyma. 3. Stable bilateral adrenal nodules. 4. Significant diverticulosis. Inflammation associated with the ascending colon favored to be related to diverticulitis. Malignancy should also be considered and followup is recommended. Colonoscopy and/or contrast enema may be helpful for further evaluation following clinical improvement. 5. Diffuse body wall edema. 6. Cardiomegaly.  Mitral annulus and coronary calcifications.   Electronically Signed   By: Rosalie GumsBeth  Brown M.D.   On: 05/27/2014 13:39   [place] Abdomen Limited  05/27/2014   CLINICAL DATA:  Abdominal pain. Follow-up distended gallbladder on CT.  EXAM: US ABDOMEN LIMITED - RIGHT UPPER QUADRANT  COMPARISON:  CT of the abdomen and pelvis May 27, 2014 at 1303 hr  FINDINGS: Gallbladder:  Layering gallbladder sludge, tiny echogenic gallstones with acoustic shadowing. Gallbladder distention without wall thickening or pericholecystic fluid. No sonographic Murphy's sign elicited.  Common bile duct:  Diameter: 5 mm  Liver:  No focal lesion identified. Within normal limits in parenchymal echogenicity. Hepatopetal portal vein.  IMPRESSION: Distended gallbladder with sludge/ cholelithiasis, no superimposed findings of acute cholecystitis.   Electronically Signed   By: Awilda Metroourtnay  Bloomer   On: 05/27/2014 15:01   Dg Chest Port 1 View  05/27/2014   CLINICAL DATA:   Mid chest and abdominal pain since Saturday. Intermittent cough.  EXAM: PORTABLE CHEST - 1 VIEW  COMPARISON:  01/20/2013.  FINDINGS: Trachea is midline. Heart size stable. The loop recorder projects over the left heart. Linear scarring or thickening of the minor fissure in the mid right hemi thorax. Probable scarring at both lung bases. Lungs are otherwise clear. No pleural fluid. Right hemidiaphragm is rather elevated, as before. Calcifications in the right upper quadrant may represent gallstones.  IMPRESSION: No acute findings.   Electronically Signed   By: Leanna BattlesMelinda  Blietz M.D.   On: 05/27/2014 11:08  All radiology studies independently viewed by me.      EKG Interpretation   Date/Time:  Monday May 27 2014 10:04:48 EST Ventricular Rate:  135 PR Interval:  232 QRS Duration: 148 QT Interval:  368 QTC Calculation: 552 R Axis:   77 Text Interpretation:  Sinus tachycardia with 1st degree A-V block with  Fusion complexes Right bundle branch block T wave abnormality, consider  inferolateral ischemia Abnormal ECG nonspecific t wave changes more  pronounced than previous and rate increased. Confirmed by [facility]  MD,  TREY ([id number]) on 05/27/2014 12:15:12 PM      MDM   Final diagnoses:  Shortness of breath  Abdominal pain  Cholecystitis    5:22 PM 80 yo female presenting with several days of decreased appetite, abdominal pain, and malaise.  Initially, presented with tachycardia, but no fevers, normal BPs.  Shortly after arrival, her troponin resulted at elevated.  At that time, I consulted Dr. Sharyn LullHarwani.  I discussed her case with him at that time, but we agreed that her elevated troponin was likely related to her abdominal pain as opposed to a primary cardiac problem.  Meanwhile, her CT resulted, demonstrating distension of her gallbladder and possible diverticulitis.  She was given IV Zosyn.  I consulted Dr. Sharyn LullHarwani for admission.  I also consulted General Surgery, who recommended GI consult  as well.  I consulted Dr. Randa EvensEdwards (GI) who will see pt.    Of note, I discussed patient's illness with her family and HCPOA, who reported that pt would not want CPR  or Intubation.    Merrie Roof, MD 05/27/14 1723  Merrie Roof, MD 05/27/14 [id number]

## 2014-05-27 NOTE — ED Notes (Signed)
Pt remains monitored by blood pressure, pulse ox, and 5 lead. Pts family remains at bedside. Pt currently drinking contrast for CT.

## 2014-05-27 NOTE — ED Notes (Signed)
Pt brought in by family c/o abd pain and decreased PO intake; noted pt tachycardic and SOB

## 2014-05-27 NOTE — Progress Notes (Signed)
Unit CM UR Completed by [facility] ED CM  W. Navpreet Szczygiel RN  

## 2014-05-27 NOTE — Progress Notes (Signed)
ANTIBIOTIC CONSULT NOTE - INITIAL  Pharmacy Consult for Zosyn Indication: intra-abd infection  Allergies  Allergen Reactions  . Codeine Other (See Comments)    UNKNOWN; Per MAR    Patient Measurements:    Vital Signs: Temp: 99.1 F (37.3 C) (12/28 1130) Temp Source: Rectal (12/28 1130) BP: 104/79 mmHg (12/28 1345) Pulse Rate: 124 (12/28 1345) Intake/Output from previous day:   Intake/Output from this shift:    Labs:  Recent Labs  05/27/14 1012  WBC 8.8  HGB 12.8  PLT 195  CREATININE 1.14*   CrCl cannot be calculated (Unknown ideal weight.). No results for input(s): VANCOTROUGH, VANCOPEAK, VANCORANDOM, GENTTROUGH, GENTPEAK, GENTRANDOM, TOBRATROUGH, TOBRAPEAK, TOBRARND, AMIKACINPEAK, AMIKACINTROU, AMIKACIN in the last 72 hours.   Microbiology: No results found for this or any previous visit (from the past 720 hour(s)).  Medical History: Past Medical History  Diagnosis Date  . Syncope   . Atypical chest pain     Nonobstructive catheterization; negative Myoview 2011  . CVA 11/23/2008  . HYPERTENSION 11/23/2008  . Coronary artery disease   . 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 Other primary cardiomyopathies   . Dementia 11/23/2008  . Non Q wave myocardial infarction hx    notes 09/11/2013  . GERD (gastroesophageal reflux disease)   . SEIZURE DISORDER 11/23/2008    "she's had blackouts so loop recorder placed" (09/11/2013)  . Arthritis     "hands, legs, back" (09/11/2013)  . Status post placement of implantable loop recorder     Medications:  See electronic med rec  Assessment: 80 y.o. female presents with abd pain. Pt is tachycardic on admission. To begin Zosyn for intra-abd infection coverage. SCr 1.14, est CrCl ~58 ml/min.  Goal of Therapy:  Resolution of infection  Plan:  1. Zosyn 3.375gm IV now over 30 minutes then 3.375gm IV q8h - subsequent doses over 4 hours 2. Will f/u micro data, pt's clinical condition, and renal function  Christoper Fabian, PharmD, BCPS Clinical pharmacist, pager [phone] 05/27/2014,2:04 PM

## 2014-05-27 NOTE — Progress Notes (Signed)
ANTICOAGULATION CONSULT NOTE - Initial Consult  Pharmacy Consult for Heparin Indication: atrial fibrillation  Allergies  Allergen Reactions  . Codeine Other (See Comments)    UNKNOWN; Per MAR    Patient Measurements:   Heparin Dosing Weight: 44kg   Vital Signs: Temp: 99.1 F (37.3 C) (12/28 1130) Temp Source: Rectal (12/28 1130) BP: 90/66 mmHg (12/28 1530) Pulse Rate: 124 (12/28 1530)  Labs:  Recent Labs  05/27/14 1012 05/27/14 1039  HGB 12.8  --   HCT 38.7  --   PLT 195  --   CREATININE 1.14*  --   TROPONINI  --  1.30*    CrCl cannot be calculated (Unknown ideal weight.).   Medical History: Past Medical History  Diagnosis Date  . Syncope   . Atypical chest pain     Nonobstructive catheterization; negative Myoview 2011  . CVA 11/23/2008  . HYPERTENSION 11/23/2008  . Coronary artery disease   . 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 Other primary cardiomyopathies   . Dementia 11/23/2008  . Non Q wave myocardial infarction hx    notes 09/11/2013  . GERD (gastroesophageal reflux disease)   . SEIZURE DISORDER 11/23/2008    "she's had blackouts so loop recorder placed" (09/11/2013)  . Arthritis     "hands, legs, back" (09/11/2013)  . Status post placement of implantable loop recorder     Medications:  Prescriptions prior to admission  Medication Sig Dispense Refill Last Dose  . aspirin EC 81 MG EC tablet Take 1 tablet (81 mg total) by mouth daily. 30 tablet 3 05/26/2014 at Unknown time  . donepezil (ARICEPT) 5 MG tablet Take 5 mg by mouth at bedtime.   05/26/2014 at Unknown time  . lisinopril (PRINIVIL,ZESTRIL) 5 MG tablet Take 2.5 mg by mouth daily.   05/26/2014 at Unknown time  . omeprazole (PRILOSEC) 20 MG capsule Take 20 mg by mouth daily.   05/26/2014 at Unknown time  . polyethylene glycol (MIRALAX / GLYCOLAX) packet Take 17 g by mouth every other day.    Past Week at Unknown time  . potassium chloride SA (K-DUR,KLOR-CON) 20 MEQ tablet Take 20 mEq  by mouth daily.   05/26/2014 at Unknown time  . simvastatin (ZOCOR) 20 MG tablet Take 20 mg by mouth every evening.   05/26/2014 at Unknown time  . feeding supplement (ENSURE COMPLETE) LIQD Take 237 mLs by mouth 2 (two) times daily between meals. (Patient not taking: Reported on 05/27/2014) 60 Bottle 1 Not Taking at Unknown time  . ferrous gluconate (FERGON) 324 MG tablet Take 1 tablet (324 mg total) by mouth daily with breakfast. (Patient not taking: Reported on 05/27/2014) 30 tablet 3 Not Taking at Unknown time    Assessment: 80 yo F admitted 05/27/2014 with abdominal pain and generl weakness. Pharmacy consulted to dose heparin for atrial fibrillation.    Goal of Therapy:  Heparin level 0.3-0.7 units/ml Monitor platelets by anticoagulation protocol: Yes   Plan:  1. Heparin 650 units/hr 2. Check heparin level 8h after ggt starts 3. Daily heparin level & CBC  Thank you for allowing pharmacy to be a part of this patients care team.  Lovenia Kim Pharm.D., BCPS, AQ-Cardiology Clinical Pharmacist 05/27/2014 7:03 PM Pager: [phone] Phone: [phone]

## 2014-05-27 NOTE — ED Notes (Signed)
Troponin results given to the primary nurse Berline Lopes, RN on [employer] A

## 2014-05-27 NOTE — Consult Note (Signed)
[facility] Surgery Admission Note  [name] 19-Aug-1929  [id number].    Requesting MD: Dr. Doy Mince Chief Complaint/Reason for Consult: Cholelithiasis with obstruction  HPI:  80 y/o AA female presents to [facility] with 3 days of anorexia, abdominal pain, nausea, fatigue, diarrhea, SOB, weakness, and lethargy.  Patient is not able to participate in her history due to being quite ill, but does follow basic commands.  Family members are at bedside and able to fill in the details.  The family states she started c/o severe RUQ abdominal pain on Saturday which has gradually worsened.  They tried antiemetics at home without relief.  She has been eating very little, only chicken broth going down.  Today she is very nauseated since having to drink contrast dye.  Family notes no other problems with her abdomen except for 2 years ago she had Emergent ex lap with obturator hernia repair 01/11/2014 by Dr. Grandville Silos.  Patient's family denies her having CP, fever/chills, vomiting, urinary symptoms.  No radiating pain, no alleviating or aggravating factors.    ROS: All systems reviewed and otherwise negative except for as above  Family History  Problem Relation Age of Onset  . Heart failure Neg Hx   . Stroke    . Heart disease      Past Medical History  Diagnosis Date  . Syncope   . Atypical chest pain     Nonobstructive catheterization; negative Myoview 2011  . CVA 11/23/2008  . HYPERTENSION 11/23/2008  . Coronary artery disease   . 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 Other primary cardiomyopathies   . Dementia 11/23/2008  . Non Q wave myocardial infarction hx    notes 09/11/2013  . GERD (gastroesophageal reflux disease)   . SEIZURE DISORDER 11/23/2008    "she's had blackouts so loop recorder placed" (09/11/2013)  . Arthritis     "hands, legs, back" (09/11/2013)  . Status post placement of implantable loop recorder     Past Surgical History  Procedure Laterality Date  . Loop  recorder implant  ?2010  . Colonoscopy    . Eye surgery    . Cataract extraction w/ intraocular lens  implant, bilateral Bilateral   . Laparotomy  11/30/2011    Procedure: EXPLORATORY LAPAROTOMY;  Surgeon: Zenovia Jarred, MD;  Location: [place];  Service: General;  Laterality: N/A;  Exploratory Laparotomy with Primary Repair Obturator Hernia  . Cardiac catheterization  10/2011  . Left heart catheterization with coronary angiogram N/A 11/26/2011    Procedure: LEFT HEART CATHETERIZATION WITH CORONARY ANGIOGRAM;  Surgeon: Clent Demark, MD;  Location[facility] C[facility] LAB;  Service: Cardiovascular;  Laterality: N/A;    Social History:  reports that she has quit smoking. Her smoking use included Cigarettes. She smoked 0.00 packs per day. She has never used smokeless tobacco. She reports that she does not drink alcohol or use illicit drugs.  Allergies:  Allergies  Allergen Reactions  . Codeine Other (See Comments)    UNKNOWN; Per MAR     (Not in a hospital admission)  Blood pressure 90/66, pulse 124, temperature 99.1 F (37.3 C), temperature source Rectal, resp. rate 18, SpO2 100 %. Physical Exam: General: Minimally arousable, somnolent, WD/WN AA female who is laying in bed in moderate distress HEENT: head is normocephalic, atraumatic.  Sclera are noninjected.  ?mildly icteric.  PERRL.    Ears and nose without any masses or lesions.  Mouth is pink and dry, without false teeth currently Heart: regular, rate, and rhythm.  No obvious murmurs, gallops, or rubs noted.  Palpable pedal pulses bilaterally Lungs: CTAB, no wheezes, rhonchi, or rales noted.  Respiratory effort nonlabored Abd: soft, ND, significantly tender in RUQ, +BS, no masses, hernias, or organomegaly, no midline surgical scars noted MS: all 4 extremities are symmetrical with no cyanosis, clubbing, or edema. Skin: warm and dry but pale skin, no rashes or lesions Psych: A&Ox3 with an appropriate affect.   Results for orders placed or  performed during the hospital encounter of 05/27/14 (from the past 48 hour(s))  BNP (order ONLY if patient complains of dyspnea/SOB AND you have documented it for THIS visit)     Status: Abnormal   Collection Time: 05/27/14 10:12 AM  Result Value Ref Range   B Natriuretic Peptide 3077.1 (H) 0.0 - 100.0 pg/mL    Comment: Please note change in reference range.  CBC with Differential     Status: Abnormal   Collection Time: 05/27/14 10:12 AM  Result Value Ref Range   WBC 8.8 4.0 - 10.5 K/uL   RBC 5.74 (H) 3.87 - 5.11 MIL/uL   Hemoglobin 12.8 12.0 - 15.0 g/dL   HCT 38.7 36.0 - 46.0 %   MCV 67.4 (L) 78.0 - 100.0 fL   MCH 22.3 (L) 26.0 - 34.0 pg   MCHC 33.1 30.0 - 36.0 g/dL   RDW 21.5 (H) 11.5 - 15.5 %   Platelets 195 150 - 400 K/uL   Neutrophils Relative % 80 (H) 43 - 77 %   Lymphocytes Relative 12 12 - 46 %   Monocytes Relative 8 3 - 12 %   Eosinophils Relative 0 0 - 5 %   Basophils Relative 0 0 - 1 %   nRBC 22 (H) 0 /100 WBC   Neutro Abs 7.0 1.7 - 7.7 K/uL   Lymphs Abs 1.1 0.7 - 4.0 K/uL   Monocytes Absolute 0.7 0.1 - 1.0 K/uL   Eosinophils Absolute 0.0 0.0 - 0.7 K/uL   Basophils Absolute 0.0 0.0 - 0.1 K/uL   RBC Morphology POLYCHROMASIA PRESENT     Comment: SCHISTOCYTES PRESENT (2-5/hpf) ELLIPTOCYTES    Smear Review LARGE PLATELETS PRESENT   Comprehensive metabolic panel     Status: Abnormal   Collection Time: 05/27/14 10:12 AM  Result Value Ref Range   Sodium 141 135 - 145 mmol/L    Comment: Please note change in reference range.   Potassium 3.3 (L) 3.5 - 5.1 mmol/L    Comment: Please note change in reference range.   Chloride 109 96 - 112 mEq/L   CO2 20 19 - 32 mmol/L   Glucose, Bld 147 (H) 70 - 99 mg/dL   BUN 49 (H) 6 - 23 mg/dL   Creatinine, Ser 1.14 (H) 0.50 - 1.10 mg/dL   Calcium 8.8 8.4 - 10.5 mg/dL   Total Protein 9.5 (H) 6.0 - 8.3 g/dL   Albumin 4.0 3.5 - 5.2 g/dL   AST 420 (H) 0 - 37 U/L   ALT 579 (H) 0 - 35 U/L   Alkaline Phosphatase 167 (H) 39 - 117 U/L    Total Bilirubin 2.8 (H) 0.3 - 1.2 mg/dL   GFR calc non Af Amer 44 (L) >90 mL/min   GFR calc Af Amer 50 (L) >90 mL/min    Comment: (NOTE) The eGFR has been calculated using the CKD EPI equation. This calculation has not been validated in all clinical situations. eGFR's persistently <90 mL/min signify possible Chronic Kidney Disease.    Anion gap 12 5 -  15  Lactic acid, plasma     Status: None   Collection Time: 05/27/14 10:39 AM  Result Value Ref Range   Lactic Acid, Venous 1.7 0.5 - 2.2 mmol/L  Troponin I     Status: Abnormal   Collection Time: 05/27/14 10:39 AM  Result Value Ref Range   Troponin I 1.30 (HH) <0.031 ng/mL    Comment:        POSSIBLE MYOCARDIAL ISCHEMIA. SERIAL TESTING RECOMMENDED. Please note change in reference range. REPEATED TO VERIFY CRITICAL RESULT CALLED TO, READ BACK BY AND VERIFIED WITH: Jacinta Shoe AT 1231 05/27/14 BY K BARR   Lipase, blood     Status: Abnormal   Collection Time: 05/27/14 10:39 AM  Result Value Ref Range   Lipase 67 (H) 11 - 59 U/L  I-stat troponin, ED (not at [facility])     Status: Abnormal   Collection Time: 05/27/14 11:00 AM  Result Value Ref Range   Troponin i, poc 1.42 (HH) 0.00 - 0.08 ng/mL   Comment NOTIFIED PHYSICIAN    Comment 3            Comment: Due to the release kinetics of cTnI, a negative result within the first hours of the onset of symptoms does not rule out myocardial infarction with certainty. If myocardial infarction is still suspected, repeat the test at appropriate intervals.   I-Stat Arterial Blood Gas, ED - (order at [facility] and [facility] only)     Status: Abnormal   Collection Time: 05/27/14 11:22 AM  Result Value Ref Range   pH, Arterial 7.315 (L) 7.350 - 7.450   pCO2 arterial 34.2 (L) 35.0 - 45.0 mmHg   pO2, Arterial 119.0 (H) 80.0 - 100.0 mmHg   Bicarbonate 17.4 (L) 20.0 - 24.0 mEq/L   TCO2 18 0 - 100 mmol/L   O2 Saturation 98.0 %   Acid-base deficit 8.0 (H) 0.0 - 2.0 mmol/L   Patient temperature 98.6 F     Collection site RADIAL, ALLEN'S TEST ACCEPTABLE    Drawn by RT    Sample type ARTERIAL   Urinalysis, Routine w reflex microscopic     Status: Abnormal   Collection Time: 05/27/14  1:58 PM  Result Value Ref Range   Color, Urine YELLOW YELLOW   APPearance CLEAR CLEAR   Specific Gravity, Urine 1.039 (H) 1.005 - 1.030   pH 5.0 5.0 - 8.0   Glucose, UA NEGATIVE NEGATIVE mg/dL   Hgb urine dipstick NEGATIVE NEGATIVE   Bilirubin Urine SMALL (A) NEGATIVE   Ketones, ur NEGATIVE NEGATIVE mg/dL   Protein, ur 30 (A) NEGATIVE mg/dL   Urobilinogen, UA 2.0 (H) 0.0 - 1.0 mg/dL   Nitrite NEGATIVE NEGATIVE   Leukocytes, UA NEGATIVE NEGATIVE  Urine microscopic-add on     Status: Abnormal   Collection Time: 05/27/14  1:58 PM  Result Value Ref Range   Squamous Epithelial / LPF FEW (A) RARE   WBC, UA 3-6 <3 WBC/hpf   RBC / HPF 3-6 <3 RBC/hpf   Bacteria, UA FEW (A) RARE   Casts GRANULAR CAST (A) NEGATIVE    Comment: HYALINE CASTS   Ct Abdomen Pelvis W Contrast  05/27/2014   CLINICAL DATA:  Abdominal pain. Nausea, vomiting. Decreased p.o. intake and shortness of breath.  EXAM: CT ABDOMEN AND PELVIS WITH CONTRAST  TECHNIQUE: Multidetector CT imaging of the abdomen and pelvis was performed using the standard protocol following bolus administration of intravenous contrast.  CONTRAST:  45m OMNIPAQUE IOHEXOL 300 MG/ML  SOLN  COMPARISON:  CT of the abdomen and pelvis 01/20/2013  FINDINGS: Lower chest: There are fibrotic changes at the lung bases. Heart is markedly enlarged. Coronary artery and mitral annulus calcifications are present. No pericardial effusion.  Upper abdomen: There is a diffusely heterogeneous appearance of the liver without discrete lesions. Findings are consistent with hepatic congestion. No focal abnormality identified within the spleen, pancreas. Bilateral adrenal lesions appear stable over prior studies, measuring 1.4 cm on the right and 3.5 cm on the left. Gallbladder is markedly distended and  measures 17 cm in length. There is no CT evidence for stones or pericholecystic fluid.  Gastrointestinal tract: The stomach and small bowel loops are normal in caliber. There is significant stranding surrounding the ascending colon which contains numerous diverticula. Colonic wall in this region is thickened and associated with numerous diverticula. No evidence for perforation. The appendix is well seen and has a normal appearance.  Pelvis: The uterus is surgically absent. No adnexal mass. Adjacent to the left aspect of the base the bladder there is a small fluid-filled structure of likely representing a small bladder diverticulum.  Retroperitoneum: No retroperitoneal or mesenteric adenopathy.  Abdominal wall: There is diffuse body wall edema. 5.2 x 1.3 cm lipoma identified in the right mid abdominal subcutaneous tissues.  Osseous structures: Scoliosis. No suspicious lytic or blastic lesions are identified.  IMPRESSION: 1. Marked distension of the gallbladder. Ultrasound may be helpful for further evaluation regarding the duct and possible gallstones. 2. Hepatic congestion with diffusely mottled appearance of the liver parenchyma. 3. Stable bilateral adrenal nodules. 4. Significant diverticulosis. Inflammation associated with the ascending colon favored to be related to diverticulitis. Malignancy should also be considered and followup is recommended. Colonoscopy and/or contrast enema may be helpful for further evaluation following clinical improvement. 5. Diffuse body wall edema. 6. Cardiomegaly.  Mitral annulus and coronary calcifications.   Electronically Signed   By: Shon Hale M.D.   On: 05/27/2014 13:39   US Abdomen Limited  05/27/2014   CLINICAL DATA:  Abdominal pain. Follow-up distended gallbladder on CT.  EXAM: US ABDOMEN LIMITED - RIGHT UPPER QUADRANT  COMPARISON:  CT of the abdomen and pelvis May 27, 2014 at 1303 hr  FINDINGS: Gallbladder:  Layering gallbladder sludge, tiny echogenic gallstones  with acoustic shadowing. Gallbladder distention without wall thickening or pericholecystic fluid. No sonographic Murphy's sign elicited.  Common bile duct:  Diameter: 5 mm  Liver:  No focal lesion identified. Within normal limits in parenchymal echogenicity. Hepatopetal portal vein.  IMPRESSION: Distended gallbladder with sludge/ cholelithiasis, no superimposed findings of acute cholecystitis.   Electronically Signed   By: Elon Alas   On: 05/27/2014 15:01   Dg Chest Port 1 View  05/27/2014   CLINICAL DATA:  Mid chest and abdominal pain since Saturday. Intermittent cough.  EXAM: PORTABLE CHEST - 1 VIEW  COMPARISON:  01/20/2013.  FINDINGS: Trachea is midline. Heart size stable. The loop recorder projects over the left heart. Linear scarring or thickening of the minor fissure in the mid right hemi thorax. Probable scarring at both lung bases. Lungs are otherwise clear. No pleural fluid. Right hemidiaphragm is rather elevated, as before. Calcifications in the right upper quadrant may represent gallstones.  IMPRESSION: No acute findings.   Electronically Signed   By: Lorin Picket M.D.   On: 05/27/2014 11:08      Assessment/Plan Cholecystitis/cholelithiasis with obstruction Transaminitis and hyperbilirubinemia concerning for CBD obstruction Elevated lipase - mild pancreatitis Elevated alk phos Hypotensive/Tachy ?SIRS vs spetic H/o Exploratory  laparotomy and primary repair of obturator hernia - Dr. Grandville Silos 11/30/2011  Plan: 1.  Admit to medicine, we will consult, please consult GI ASAP regarding possible ERCP, cardiology consulted - may need pre-op clearence 2.  NPO, bowel rest, IVF, pain control, antiemetics, antibiotics (Zosyn started Day #1) 3.  Needs to be resuscitated, she's tachycardic and borderline hypotensive, reportedly going to SDU, but may need ICU 4.  Recheck labs in am 5.  Discussed with the family about ERCP to remove stones alleviate blockage, also discussed cholecystectomy  and perc chole tube.  Will await other consultants and primary service evaluations prior to deciding on plan moving forward, but we feel she likely needs resuscitation, IV antibiotics, and ERCP prior to consideration of OR.   6.  Family on board with tentative plan thus far.    Coralie Keens, [facility] Surgery 05/27/2014, 4:06 PM Pager: [phone]

## 2014-05-27 NOTE — Consult Note (Signed)
[place] Reason for consult: abnormal liver test, cholelithiasis, possible cholangitis Referring Physician: ER  [name] is an 80 y.o. female.  HPI: she has a history of dementia and has had cardiomyopathy coronary disease. She has had some type of loop recorder placed to monitor her rhythm and apparently due to her debilitated state the loop was left in place. She has history of coronary disease and ventricular dysfunction. Apparently questions of seizures as well. She was brought to the emergency room with several days of right upper quadrant pain. Apparently she been eating very little and was nauseated with some vomiting. She has had previous obturator hernia repair about 6 months ago. The patient presented to the emergency room of these findings. Labs revealed WBC 8.8, AP 167 lipase 67, AST/ALT 400 to 500, and total bilirubin 2.8. Troponin's were slightly elevated. CT of the abdomen showed mottled appearance of the liver but a massively dilated gallbladder and actually distended down almost to the pelvis. The biliary ductal system was not clearly dilated. Ultrasound showed massively distended gallbladder with sludge in gallstones but is CBD 0.5 mm.  Past Medical History  Diagnosis Date  . Syncope   . Atypical chest pain     Nonobstructive catheterization; negative Myoview 2011  . CVA 11/23/2008  . HYPERTENSION 11/23/2008  . Coronary artery disease   . 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 Other primary cardiomyopathies   . Dementia 11/23/2008  . Non Q wave myocardial infarction hx    notes 09/11/2013  . GERD (gastroesophageal reflux disease)   . SEIZURE DISORDER 11/23/2008    "she's had blackouts so loop recorder placed" (09/11/2013)  . Arthritis     "hands, legs, back" (09/11/2013)  . Status post placement of implantable loop recorder     Past Surgical History  Procedure Laterality Date  . Loop recorder implant  ?2010  . Colonoscopy    . Eye surgery     . Cataract extraction w/ intraocular lens  implant, bilateral Bilateral   . Laparotomy  11/30/2011    Procedure: EXPLORATORY LAPAROTOMY;  Surgeon: Zenovia Jarred, MD;  Location: [facility];  Service: General;  Laterality: N/A;  Exploratory Laparotomy with Primary Repair Obturator Hernia  . Cardiac catheterization  10/2011  . Left heart catheterization with coronary angiogram N/A 11/26/2011    Procedure: LEFT HEART CATHETERIZATION WITH CORONARY ANGIOGRAM;  Surgeon: Clent Demark, MD;  Location: [facility] CATH LAB;  Service: Cardiovascular;  Laterality: N/A;    Family History  Problem Relation Age of Onset  . Heart failure Neg Hx   . Stroke    . Heart disease      Social History:  reports that she has quit smoking. Her smoking use included Cigarettes. She smoked 0.00 packs per day. She has never used smokeless tobacco. She reports that she does not drink alcohol or use illicit drugs.  Allergies:  Allergies  Allergen Reactions  . Codeine Other (See Comments)    UNKNOWN; Per MAR    Medications; Prior to Admission medications   Medication Sig Start Date End Date Taking? Authorizing Provider  aspirin EC 81 MG EC tablet Take 1 tablet (81 mg total) by mouth daily. 01/22/13  Yes Clent Demark, MD  donepezil (ARICEPT) 5 MG tablet Take 5 mg by mouth at bedtime.   Yes Historical Provider, MD  lisinopril (PRINIVIL,ZESTRIL) 5 MG tablet Take 2.5 mg by mouth daily.   Yes Historical Provider, MD  omeprazole (PRILOSEC) 20 MG capsule Take 20  mg by mouth daily.   Yes Historical Provider, MD  polyethylene glycol (MIRALAX / GLYCOLAX) packet Take 17 g by mouth every other day.    Yes Historical Provider, MD  potassium chloride SA (K-DUR,KLOR-CON) 20 MEQ tablet Take 20 mEq by mouth daily.   Yes Historical Provider, MD  simvastatin (ZOCOR) 20 MG tablet Take 20 mg by mouth every evening.   Yes Historical Provider, MD  feeding supplement (ENSURE COMPLETE) LIQD Take 237 mLs by mouth 2 (two) times daily between  meals. Patient not taking: Reported on 05/27/2014 12/15/11   Clent Demark, MD  ferrous gluconate (FERGON) 324 MG tablet Take 1 tablet (324 mg total) by mouth daily with breakfast. Patient not taking: Reported on 05/27/2014 09/13/13   Clent Demark, MD     PRN Meds  Results for orders placed or performed during the hospital encounter of 05/27/14 (from the past 48 hour(s))  BNP (order ONLY if patient complains of dyspnea/SOB AND you have documented it for THIS visit)     Status: Abnormal   Collection Time: 05/27/14 10:12 AM  Result Value Ref Range   B Natriuretic Peptide 3077.1 (H) 0.0 - 100.0 pg/mL    Comment: Please note change in reference range.  CBC with Differential     Status: Abnormal   Collection Time: 05/27/14 10:12 AM  Result Value Ref Range   WBC 8.8 4.0 - 10.5 K/uL   RBC 5.74 (H) 3.87 - 5.11 MIL/uL   Hemoglobin 12.8 12.0 - 15.0 g/dL   HCT 38.7 36.0 - 46.0 %   MCV 67.4 ([name]) 78.0 - 100.0 fL   MCH 22.3 ([name]) 26.0 - 34.0 pg   MCHC 33.1 30.0 - 36.0 g/dL   RDW 21.5 (H) 11.5 - 15.5 %   Platelets 195 150 - 400 K/uL   Neutrophils Relative % 80 (H) 43 - 77 %   Lymphocytes Relative 12 12 - 46 %   Monocytes Relative 8 3 - 12 %   Eosinophils Relative 0 0 - 5 %   Basophils Relative 0 0 - 1 %   nRBC 22 (H) 0 /100 WBC   Neutro Abs 7.0 1.7 - 7.7 K/uL   Lymphs Abs 1.1 0.7 - 4.0 K/uL   Monocytes Absolute 0.7 0.1 - 1.0 K/uL   Eosinophils Absolute 0.0 0.0 - 0.7 K/uL   Basophils Absolute 0.0 0.0 - 0.1 K/uL   RBC Morphology POLYCHROMASIA PRESENT     Comment: SCHISTOCYTES PRESENT (2-5/hpf) ELLIPTOCYTES    Smear Review LARGE PLATELETS PRESENT   Comprehensive metabolic panel     Status: Abnormal   Collection Time: 05/27/14 10:12 AM  Result Value Ref Range   Sodium 141 135 - 145 mmol/[name]    Comment: Please note change in reference range.   Potassium 3.3 ([name]) 3.5 - 5.1 mmol/[name]    Comment: Please note change in reference range.   Chloride 109 96 - 112 mEq/[name]   CO2 20 19 - 32 mmol/[name]    Glucose, Bld 147 (H) 70 - 99 mg/dL   BUN 49 (H) 6 - 23 mg/dL   Creatinine, Ser 1.14 (H) 0.50 - 1.10 mg/dL   Calcium 8.8 8.4 - 10.5 mg/dL   Total Protein 9.5 (H) 6.0 - 8.3 g/dL   Albumin 4.0 3.5 - 5.2 g/dL   AST 420 (H) 0 - 37 U/[name]   ALT 579 (H) 0 - 35 U/[name]   Alkaline Phosphatase 167 (H) 39 - 117 U/[name]   Total Bilirubin 2.8 (H)  0.3 - 1.2 mg/dL   GFR calc non Af Amer 44 ([name]) >90 mL/min   GFR calc Af Amer 50 ([name]) >90 mL/min    Comment: (NOTE) The eGFR has been calculated using the CKD EPI equation. This calculation has not been validated in all clinical situations. eGFR's persistently <90 mL/min signify possible Chronic Kidney Disease.    Anion gap 12 5 - 15  Lactic acid, plasma     Status: None   Collection Time: 05/27/14 10:39 AM  Result Value Ref Range   Lactic Acid, Venous 1.7 0.5 - 2.2 mmol/[name]  Troponin I     Status: Abnormal   Collection Time: 05/27/14 10:39 AM  Result Value Ref Range   Troponin I 1.30 (HH) <0.031 ng/mL    Comment:        POSSIBLE MYOCARDIAL ISCHEMIA. SERIAL TESTING RECOMMENDED. Please note change in reference range. REPEATED TO VERIFY CRITICAL RESULT CALLED TO, READ BACK BY AND VERIFIED WITH: Mingo Amber AT 1231 05/27/14 BY K BARR   Lipase, blood     Status: Abnormal   Collection Time: 05/27/14 10:39 AM  Result Value Ref Range   Lipase 67 (H) 11 - 59 U/[name]  I-stat troponin, ED (not at [facility])     Status: Abnormal   Collection Time: 05/27/14 11:00 AM  Result Value Ref Range   Troponin i, poc 1.42 (HH) 0.00 - 0.08 ng/mL   Comment NOTIFIED PHYSICIAN    Comment 3            Comment: Due to the release kinetics of cTnI, a negative result within the first hours of the onset of symptoms does not rule out myocardial infarction with certainty. If myocardial infarction is still suspected, repeat the test at appropriate intervals.   I-Stat Arterial Blood Gas, ED - (order at [facility] and [facility] only)     Status: Abnormal   Collection Time: 05/27/14 11:22 AM  Result Value Ref  Range   pH, Arterial 7.315 ([name]) 7.350 - 7.450   pCO2 arterial 34.2 ([name]) 35.0 - 45.0 mmHg   pO2, Arterial 119.0 (H) 80.0 - 100.0 mmHg   Bicarbonate 17.4 ([name]) 20.0 - 24.0 mEq/[name]   TCO2 18 0 - 100 mmol/[name]   O2 Saturation 98.0 %   Acid-base deficit 8.0 (H) 0.0 - 2.0 mmol/[name]   Patient temperature 98.6 F    Collection site RADIAL, ALLEN'S TEST ACCEPTABLE    Drawn by RT    Sample type ARTERIAL   Urinalysis, Routine w reflex microscopic     Status: Abnormal   Collection Time: 05/27/14  1:58 PM  Result Value Ref Range   Color, Urine YELLOW YELLOW   APPearance CLEAR CLEAR   Specific Gravity, Urine 1.039 (H) 1.005 - 1.030   pH 5.0 5.0 - 8.0   Glucose, UA NEGATIVE NEGATIVE mg/dL   Hgb urine dipstick NEGATIVE NEGATIVE   Bilirubin Urine SMALL (A) NEGATIVE   Ketones, ur NEGATIVE NEGATIVE mg/dL   Protein, ur 30 (A) NEGATIVE mg/dL   Urobilinogen, UA 2.0 (H) 0.0 - 1.0 mg/dL   Nitrite NEGATIVE NEGATIVE   Leukocytes, UA NEGATIVE NEGATIVE  Urine microscopic-add on     Status: Abnormal   Collection Time: 05/27/14  1:58 PM  Result Value Ref Range   Squamous Epithelial / LPF FEW (A) RARE   WBC, UA 3-6 <3 WBC/hpf   RBC / HPF 3-6 <3 RBC/hpf   Bacteria, UA FEW (A) RARE   Casts GRANULAR CAST (A) NEGATIVE    Comment: HYALINE  CASTS    Ct Abdomen Pelvis W Contrast  05/27/2014   CLINICAL DATA:  Abdominal pain. Nausea, vomiting. Decreased p.o. intake and shortness of breath.  EXAM: CT ABDOMEN AND PELVIS WITH CONTRAST  TECHNIQUE: Multidetector CT imaging of the abdomen and pelvis was performed using the standard protocol following bolus administration of intravenous contrast.  CONTRAST:  20mL OMNIPAQUE IOHEXOL 300 MG/ML  SOLN  COMPARISON:  CT of the abdomen and pelvis 01/20/2013  FINDINGS: Lower chest: There are fibrotic changes at the lung bases. Heart is markedly enlarged. Coronary artery and mitral annulus calcifications are present. No pericardial effusion.  Upper abdomen: There is a diffusely heterogeneous  appearance of the liver without discrete lesions. Findings are consistent with hepatic congestion. No focal abnormality identified within the spleen, pancreas. Bilateral adrenal lesions appear stable over prior studies, measuring 1.4 cm on the right and 3.5 cm on the left. Gallbladder is markedly distended and measures 17 cm in length. There is no CT evidence for stones or pericholecystic fluid.  Gastrointestinal tract: The stomach and small bowel loops are normal in caliber. There is significant stranding surrounding the ascending colon which contains numerous diverticula. Colonic wall in this region is thickened and associated with numerous diverticula. No evidence for perforation. The appendix is well seen and has a normal appearance.  Pelvis: The uterus is surgically absent. No adnexal mass. Adjacent to the left aspect of the base the bladder there is a small fluid-filled structure of likely representing a small bladder diverticulum.  Retroperitoneum: No retroperitoneal or mesenteric adenopathy.  Abdominal wall: There is diffuse body wall edema. 5.2 x 1.3 cm lipoma identified in the right mid abdominal subcutaneous tissues.  Osseous structures: Scoliosis. No suspicious lytic or blastic lesions are identified.  IMPRESSION: 1. Marked distension of the gallbladder. Ultrasound may be helpful for further evaluation regarding the duct and possible gallstones. 2. Hepatic congestion with diffusely mottled appearance of the liver parenchyma. 3. Stable bilateral adrenal nodules. 4. Significant diverticulosis. Inflammation associated with the ascending colon favored to be related to diverticulitis. Malignancy should also be considered and followup is recommended. Colonoscopy and/or contrast enema may be helpful for further evaluation following clinical improvement. 5. Diffuse body wall edema. 6. Cardiomegaly.  Mitral annulus and coronary calcifications.   Electronically Signed   By: Shon Hale M.D.   On: 05/27/2014 13:39    US Abdomen Limited  05/27/2014   CLINICAL DATA:  Abdominal pain. Follow-up distended gallbladder on CT.  EXAM: US ABDOMEN LIMITED - RIGHT UPPER QUADRANT  COMPARISON:  CT of the abdomen and pelvis May 27, 2014 at 1303 hr  FINDINGS: Gallbladder:  Layering gallbladder sludge, tiny echogenic gallstones with acoustic shadowing. Gallbladder distention without wall thickening or pericholecystic fluid. No sonographic Murphy's sign elicited.  Common bile duct:  Diameter: 5 mm  Liver:  No focal lesion identified. Within normal limits in parenchymal echogenicity. Hepatopetal portal vein.  IMPRESSION: Distended gallbladder with sludge/ cholelithiasis, no superimposed findings of acute cholecystitis.   Electronically Signed   By: Elon Alas   On: 05/27/2014 15:01   Dg Chest Port 1 View  05/27/2014   CLINICAL DATA:  Mid chest and abdominal pain since Saturday. Intermittent cough.  EXAM: PORTABLE CHEST - 1 VIEW  COMPARISON:  01/20/2013.  FINDINGS: Trachea is midline. Heart size stable. The loop recorder projects over the left heart. Linear scarring or thickening of the minor fissure in the mid right hemi thorax. Probable scarring at both lung bases. Lungs are otherwise clear. No pleural  fluid. Right hemidiaphragm is rather elevated, as before. Calcifications in the right upper quadrant may represent gallstones.  IMPRESSION: No acute findings.   Electronically Signed   By: Lorin Picket M.D.   On: 05/27/2014 11:08               Blood pressure 90/66, pulse 124, temperature 99.1 F (37.3 C), temperature source Rectal, resp. rate 18, SpO2 100 %.  Physical exam:   General--demented elderly African-American female who does not appear to be in any distress ENT-- mucous membranes dry  Heart-- regular rate and rhythm without murmurs are gallops Lungs--clear respiratory effort is unlabored Abdomen-- nondistended and generally soft with bowel sounds present. The gallbladder could possibly be  palpable. Patient is minimally tender in the right upper quadrant. She may have had some pain medication. Psych-- nonresponsive   Assessment: 1. Massively distended gallbladder with cholelithiasis. Her CBD is normal size and there is no clear biliary distention in the liver. I suspect she may have been obstructed cystic duct and possibly early cholecystitis. 2. History of cardiac arrhythmias with apparent loop recorder still in place. This may prohibit MRCP to evaluate the CBD further if needed 3. History of cardiac arrhythmias/MI/CAD. 4. Dementia  Plan: 1. We will await further surgical evaluation. ERCP could be performed but I don't know that this would really help since her extrahepatic and intrahepatic biliary system or not dilated and her gallbladders massively dilated. She may need cholecystectomy replacement of cholecystostomy tube   [name],[name] 05/27/2014, 5:31 PM

## 2014-05-27 NOTE — H&P (Signed)
[name] is an 80 y.o. female.   Chief Complaint: Abdominal pain associated with feeling weak/ tired poor appetite HPI: Patient is 80 year old female with past medical history significant for multiple medical problems i.e. mild coronary artery disease history of non-Q-wave MI in the past, type II probably secondary to demand ischemia, hypertension, history of recurrent syncope in the past status post loop recorder in the past negative workup so far, GERD, chronic anemia, degenerative joint disease, questionable seizure disorder, dementia, history of small bowel obstruction status post exploratory laparotomy/obturator hernia repair in the past, came to the ER by family complaining of vague abdominal pain associated with poor appetite diarrhea and vomiting and feeling generalized weakness for last 1 week. Patient was noted to be in A. fib with RVR with blood pressure in 90s. CT of the abdomen done showed marked distention of gallbladder with possible ductal obstruction and gallstones and questionable diverticulitis. Patient denies any chest pains. Denies any palpitations or syncopal episode. Denies any fever or chills. Past Medical History  Diagnosis Date  . Syncope   . Atypical chest pain     Nonobstructive catheterization; negative Myoview 2011  . CVA 11/23/2008  . HYPERTENSION 11/23/2008  . Coronary artery disease   . 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 Other primary cardiomyopathies   . Dementia 11/23/2008  . Non Q wave myocardial infarction hx    notes 09/11/2013  . GERD (gastroesophageal reflux disease)   . SEIZURE DISORDER 11/23/2008    "she's had blackouts so loop recorder placed" (09/11/2013)  . Arthritis     "hands, legs, back" (09/11/2013)  . Status post placement of implantable loop recorder     Past Surgical History  Procedure Laterality Date  . Loop recorder implant  ?2010  . Colonoscopy    . Eye surgery    . Cataract extraction w/ intraocular lens  implant,  bilateral Bilateral   . Laparotomy  11/30/2011    Procedure: EXPLORATORY LAPAROTOMY;  Surgeon: Zenovia Jarred, MD;  Location: [place];  Service: General;  Laterality: N/A;  Exploratory Laparotomy with Primary Repair Obturator Hernia  . Cardiac catheterization  10/2011  . Left heart catheterization with coronary angiogram N/A 11/26/2011    Procedure: LEFT HEART CATHETERIZATION WITH CORONARY ANGIOGRAM;  Surgeon: Clent Demark, MD;  Location: [facility] CATH LAB;  Service: Cardiovascular;  Laterality: N/A;    Family History  Problem Relation Age of Onset  . Heart failure Neg Hx   . Stroke    . Heart disease     Social History:  reports that she has quit smoking. Her smoking use included Cigarettes. She smoked 0.00 packs per day. She has never used smokeless tobacco. She reports that she does not drink alcohol or use illicit drugs.  Allergies:  Allergies  Allergen Reactions  . Codeine Other (See Comments)    UNKNOWN; Per MAR     (Not in a hospital admission)  Results for orders placed or performed during the hospital encounter of 05/27/14 (from the past 48 hour(s))  BNP (order ONLY if patient complains of dyspnea/SOB AND you have documented it for THIS visit)     Status: Abnormal   Collection Time: 05/27/14 10:12 AM  Result Value Ref Range   B Natriuretic Peptide 3077.1 (H) 0.0 - 100.0 pg/mL    Comment: Please note change in reference range.  CBC with Differential     Status: Abnormal   Collection Time: 05/27/14 10:12 AM  Result Value Ref Range   WBC  8.8 4.0 - 10.5 K/uL   RBC 5.74 (H) 3.87 - 5.11 MIL/uL   Hemoglobin 12.8 12.0 - 15.0 g/dL   HCT 38.7 36.0 - 46.0 %   MCV 67.4 (L) 78.0 - 100.0 fL   MCH 22.3 (L) 26.0 - 34.0 pg   MCHC 33.1 30.0 - 36.0 g/dL   RDW 21.5 (H) 11.5 - 15.5 %   Platelets 195 150 - 400 K/uL   Neutrophils Relative % 80 (H) 43 - 77 %   Lymphocytes Relative 12 12 - 46 %   Monocytes Relative 8 3 - 12 %   Eosinophils Relative 0 0 - 5 %   Basophils Relative 0 0 - 1 %    nRBC 22 (H) 0 /100 WBC   Neutro Abs 7.0 1.7 - 7.7 K/uL   Lymphs Abs 1.1 0.7 - 4.0 K/uL   Monocytes Absolute 0.7 0.1 - 1.0 K/uL   Eosinophils Absolute 0.0 0.0 - 0.7 K/uL   Basophils Absolute 0.0 0.0 - 0.1 K/uL   RBC Morphology POLYCHROMASIA PRESENT     Comment: SCHISTOCYTES PRESENT (2-5/hpf) ELLIPTOCYTES    Smear Review LARGE PLATELETS PRESENT   Comprehensive metabolic panel     Status: Abnormal   Collection Time: 05/27/14 10:12 AM  Result Value Ref Range   Sodium 141 135 - 145 mmol/L    Comment: Please note change in reference range.   Potassium 3.3 (L) 3.5 - 5.1 mmol/L    Comment: Please note change in reference range.   Chloride 109 96 - 112 mEq/L   CO2 20 19 - 32 mmol/L   Glucose, Bld 147 (H) 70 - 99 mg/dL   BUN 49 (H) 6 - 23 mg/dL   Creatinine, Ser 1.14 (H) 0.50 - 1.10 mg/dL   Calcium 8.8 8.4 - 10.5 mg/dL   Total Protein 9.5 (H) 6.0 - 8.3 g/dL   Albumin 4.0 3.5 - 5.2 g/dL   AST 420 (H) 0 - 37 U/L   ALT 579 (H) 0 - 35 U/L   Alkaline Phosphatase 167 (H) 39 - 117 U/L   Total Bilirubin 2.8 (H) 0.3 - 1.2 mg/dL   GFR calc non Af Amer 44 (L) >90 mL/min   GFR calc Af Amer 50 (L) >90 mL/min    Comment: (NOTE) The eGFR has been calculated using the CKD EPI equation. This calculation has not been validated in all clinical situations. eGFR's persistently <90 mL/min signify possible Chronic Kidney Disease.    Anion gap 12 5 - 15  Lactic acid, plasma     Status: None   Collection Time: 05/27/14 10:39 AM  Result Value Ref Range   Lactic Acid, Venous 1.7 0.5 - 2.2 mmol/L  Troponin I     Status: Abnormal   Collection Time: 05/27/14 10:39 AM  Result Value Ref Range   Troponin I 1.30 (HH) <0.031 ng/mL    Comment:        POSSIBLE MYOCARDIAL ISCHEMIA. SERIAL TESTING RECOMMENDED. Please note change in reference range. REPEATED TO VERIFY CRITICAL RESULT CALLED TO, READ BACK BY AND VERIFIED WITH: S KING,RN AT 1231 05/27/14 BY K BARR   Lipase, blood     Status: Abnormal    Collection Time: 05/27/14 10:39 AM  Result Value Ref Range   Lipase 67 (H) 11 - 59 U/L  I-stat troponin, ED (not at [facility])     Status: Abnormal   Collection Time: 05/27/14 11:00 AM  Result Value Ref Range   Troponin i, poc 1.42 (HH) 0.00 -  0.08 ng/mL   Comment NOTIFIED PHYSICIAN    Comment 3            Comment: Due to the release kinetics of cTnI, a negative result within the first hours of the onset of symptoms does not rule out myocardial infarction with certainty. If myocardial infarction is still suspected, repeat the test at appropriate intervals.   I-Stat Arterial Blood Gas, ED - (order at [facility] and [facility] only)     Status: Abnormal   Collection Time: 05/27/14 11:22 AM  Result Value Ref Range   pH, Arterial 7.315 (L) 7.350 - 7.450   pCO2 arterial 34.2 (L) 35.0 - 45.0 mmHg   pO2, Arterial 119.0 (H) 80.0 - 100.0 mmHg   Bicarbonate 17.4 (L) 20.0 - 24.0 mEq/L   TCO2 18 0 - 100 mmol/L   O2 Saturation 98.0 %   Acid-base deficit 8.0 (H) 0.0 - 2.0 mmol/L   Patient temperature 98.6 F    Collection site RADIAL, ALLEN'S TEST ACCEPTABLE    Drawn by RT    Sample type ARTERIAL   Urinalysis, Routine w reflex microscopic     Status: Abnormal   Collection Time: 05/27/14  1:58 PM  Result Value Ref Range   Color, Urine YELLOW YELLOW   APPearance CLEAR CLEAR   Specific Gravity, Urine 1.039 (H) 1.005 - 1.030   pH 5.0 5.0 - 8.0   Glucose, UA NEGATIVE NEGATIVE mg/dL   Hgb urine dipstick NEGATIVE NEGATIVE   Bilirubin Urine SMALL (A) NEGATIVE   Ketones, ur NEGATIVE NEGATIVE mg/dL   Protein, ur 30 (A) NEGATIVE mg/dL   Urobilinogen, UA 2.0 (H) 0.0 - 1.0 mg/dL   Nitrite NEGATIVE NEGATIVE   Leukocytes, UA NEGATIVE NEGATIVE  Urine microscopic-add on     Status: Abnormal   Collection Time: 05/27/14  1:58 PM  Result Value Ref Range   Squamous Epithelial / LPF FEW (A) RARE   WBC, UA 3-6 <3 WBC/hpf   RBC / HPF 3-6 <3 RBC/hpf   Bacteria, UA FEW (A) RARE   Casts GRANULAR CAST (A) NEGATIVE    Comment:  HYALINE CASTS   Ct Abdomen Pelvis W Contrast  05/27/2014   CLINICAL DATA:  Abdominal pain. Nausea, vomiting. Decreased p.o. intake and shortness of breath.  EXAM: CT ABDOMEN AND PELVIS WITH CONTRAST  TECHNIQUE: Multidetector CT imaging of the abdomen and pelvis was performed using the standard protocol following bolus administration of intravenous contrast.  CONTRAST:  13m OMNIPAQUE IOHEXOL 300 MG/ML  SOLN  COMPARISON:  CT of the abdomen and pelvis 01/20/2013  FINDINGS: Lower chest: There are fibrotic changes at the lung bases. Heart is markedly enlarged. Coronary artery and mitral annulus calcifications are present. No pericardial effusion.  Upper abdomen: There is a diffusely heterogeneous appearance of the liver without discrete lesions. Findings are consistent with hepatic congestion. No focal abnormality identified within the spleen, pancreas. Bilateral adrenal lesions appear stable over prior studies, measuring 1.4 cm on the right and 3.5 cm on the left. Gallbladder is markedly distended and measures 17 cm in length. There is no CT evidence for stones or pericholecystic fluid.  Gastrointestinal tract: The stomach and small bowel loops are normal in caliber. There is significant stranding surrounding the ascending colon which contains numerous diverticula. Colonic wall in this region is thickened and associated with numerous diverticula. No evidence for perforation. The appendix is well seen and has a normal appearance.  Pelvis: The uterus is surgically absent. No adnexal mass. Adjacent to the left aspect  of the base the bladder there is a small fluid-filled structure of likely representing a small bladder diverticulum.  Retroperitoneum: No retroperitoneal or mesenteric adenopathy.  Abdominal wall: There is diffuse body wall edema. 5.2 x 1.3 cm lipoma identified in the right mid abdominal subcutaneous tissues.  Osseous structures: Scoliosis. No suspicious lytic or blastic lesions are identified.   IMPRESSION: 1. Marked distension of the gallbladder. Ultrasound may be helpful for further evaluation regarding the duct and possible gallstones. 2. Hepatic congestion with diffusely mottled appearance of the liver parenchyma. 3. Stable bilateral adrenal nodules. 4. Significant diverticulosis. Inflammation associated with the ascending colon favored to be related to diverticulitis. Malignancy should also be considered and followup is recommended. Colonoscopy and/or contrast enema may be helpful for further evaluation following clinical improvement. 5. Diffuse body wall edema. 6. Cardiomegaly.  Mitral annulus and coronary calcifications.   Electronically Signed   By: Shon Hale M.D.   On: 05/27/2014 13:39   US Abdomen Limited  05/27/2014   CLINICAL DATA:  Abdominal pain. Follow-up distended gallbladder on CT.  EXAM: US ABDOMEN LIMITED - RIGHT UPPER QUADRANT  COMPARISON:  CT of the abdomen and pelvis May 27, 2014 at 1303 hr  FINDINGS: Gallbladder:  Layering gallbladder sludge, tiny echogenic gallstones with acoustic shadowing. Gallbladder distention without wall thickening or pericholecystic fluid. No sonographic Murphy's sign elicited.  Common bile duct:  Diameter: 5 mm  Liver:  No focal lesion identified. Within normal limits in parenchymal echogenicity. Hepatopetal portal vein.  IMPRESSION: Distended gallbladder with sludge/ cholelithiasis, no superimposed findings of acute cholecystitis.   Electronically Signed   By: Elon Alas   On: 05/27/2014 15:01   Dg Chest Port 1 View  05/27/2014   CLINICAL DATA:  Mid chest and abdominal pain since Saturday. Intermittent cough.  EXAM: PORTABLE CHEST - 1 VIEW  COMPARISON:  01/20/2013.  FINDINGS: Trachea is midline. Heart size stable. The loop recorder projects over the left heart. Linear scarring or thickening of the minor fissure in the mid right hemi thorax. Probable scarring at both lung bases. Lungs are otherwise clear. No pleural fluid. Right  hemidiaphragm is rather elevated, as before. Calcifications in the right upper quadrant may represent gallstones.  IMPRESSION: No acute findings.   Electronically Signed   By: Lorin Picket M.D.   On: 05/27/2014 11:08    Review of Systems  Constitutional: Positive for weight loss and malaise/fatigue. Negative for fever and chills.  Eyes: Negative for blurred vision and double vision.  Cardiovascular: Negative for chest pain and palpitations.  Gastrointestinal: Positive for nausea, vomiting, abdominal pain and diarrhea.  Genitourinary: Negative for dysuria.  Neurological: Negative for seizures, loss of consciousness and headaches.    Blood pressure 90/66, pulse 124, temperature 99.1 F (37.3 C), temperature source Rectal, resp. rate 18, SpO2 100 %. Physical Exam  Constitutional: She is oriented to person, place, and time.  HENT:  Head: Normocephalic and atraumatic.  Eyes: Conjunctivae are normal. Pupils are equal, round, and reactive to light. Scleral icterus is present.  Neck: Normal range of motion. Neck supple. No JVD present. No tracheal deviation present. No thyromegaly present.  Cardiovascular:  Tachycardic irregularly irregular S1 and S2 soft there is soft systolic murmur noted  Respiratory:  Decreased breath sound at bases  GI: Soft. Bowel sounds are normal.  Mild right upper quadrant tenderness noted  Musculoskeletal: She exhibits no edema or tenderness.  Neurological: She is alert and oriented to person, place, and time.     Assessment/Plan New-onset  A. fib with RVR Acute cholecystitis/cholangitis Compensated CHF secondary to preserved LV systolic function History of non-Q-wave MI in the past secondary to demand ischemia Mildly elevated troponin I secondary to demand ischemia doubt significant MI as patient has similar presentation in the past and noted to have mild CAD. History of recurrent syncope in the past status post loop  recorder hypertension Hypercholesteremia Osteoarthritis Anemia GERD Dementia next history of questionable seizure disorder Plan As per orders Check 2-D echo Start IV heparin and Cardizem drip as per orders Pancultures IV antibiotics Scheduled for percutaneous drainage of gallbladder by interventional radiology in a.m. Okay to DC heparin 4 hours prior to the procedure GI consult  Audie Wieser N 05/27/2014, 5:35 PM

## 2014-05-27 NOTE — ED Notes (Signed)
Patient to CT at this time

## 2014-05-28 ENCOUNTER — Inpatient Hospital Stay (HOSPITAL_COMMUNITY): Payer: Medicare Other

## 2014-05-28 ENCOUNTER — Encounter (HOSPITAL_COMMUNITY): Payer: Self-pay | Admitting: *Deleted

## 2014-05-28 DIAGNOSIS — R109 Unspecified abdominal pain: Secondary | ICD-10-CM | POA: Insufficient documentation

## 2014-05-28 DIAGNOSIS — E43 Unspecified severe protein-calorie malnutrition: Secondary | ICD-10-CM | POA: Insufficient documentation

## 2014-05-28 LAB — PROTIME-INR
INR: 1.43 (ref 0.00–1.49)
PROTHROMBIN TIME: 17.6 s — AB (ref 11.6–15.2)

## 2014-05-28 LAB — CBC
HCT: 31.7 % — ABNORMAL LOW (ref 36.0–46.0)
HEMOGLOBIN: 9.9 g/dL — AB (ref 12.0–15.0)
MCH: 21.4 pg — AB (ref 26.0–34.0)
MCHC: 31.2 g/dL (ref 30.0–36.0)
MCV: 68.5 fL — ABNORMAL LOW (ref 78.0–100.0)
Platelets: 205 10*3/uL (ref 150–400)
RBC: 4.63 MIL/uL (ref 3.87–5.11)
RDW: 21.2 % — AB (ref 11.5–15.5)
WBC: 6.4 10*3/uL (ref 4.0–10.5)

## 2014-05-28 LAB — LIPID PANEL
Cholesterol: 119 mg/dL (ref 0–200)
HDL: 28 mg/dL — AB (ref >39)
LDL Cholesterol: 73 mg/dL (ref 0–99)
TRIGLYCERIDES: 92 mg/dL (ref <150)
Total CHOL/HDL Ratio: 4.3 RATIO
VLDL: 18 mg/dL (ref 0–40)

## 2014-05-28 LAB — COMPREHENSIVE METABOLIC PANEL
ALT: 387 U/L — ABNORMAL HIGH (ref 0–35)
AST: 245 U/L — ABNORMAL HIGH (ref 0–37)
Albumin: 2.9 g/dL — ABNORMAL LOW (ref 3.5–5.2)
Alkaline Phosphatase: 122 U/L — ABNORMAL HIGH (ref 39–117)
Anion gap: 10 (ref 5–15)
BUN: 52 mg/dL — ABNORMAL HIGH (ref 6–23)
CO2: 19 mmol/L (ref 19–32)
Calcium: 7.8 mg/dL — ABNORMAL LOW (ref 8.4–10.5)
Chloride: 111 mEq/L (ref 96–112)
Creatinine, Ser: 1.22 mg/dL — ABNORMAL HIGH (ref 0.50–1.10)
GFR calc Af Amer: 46 mL/min — ABNORMAL LOW (ref >90)
GFR calc non Af Amer: 40 mL/min — ABNORMAL LOW (ref >90)
Glucose, Bld: 110 mg/dL — ABNORMAL HIGH (ref 70–99)
Potassium: 3.5 mmol/L (ref 3.5–5.1)
Sodium: 140 mmol/L (ref 135–145)
Total Bilirubin: 1.1 mg/dL (ref 0.3–1.2)
Total Protein: 6.6 g/dL (ref 6.0–8.3)

## 2014-05-28 LAB — SURGICAL PCR SCREEN
MRSA, PCR: NEGATIVE
Staphylococcus aureus: POSITIVE — AB

## 2014-05-28 LAB — HEPARIN LEVEL (UNFRACTIONATED)
Heparin Unfractionated: 0.1 IU/mL — ABNORMAL LOW (ref 0.30–0.70)
Heparin Unfractionated: 0.32 IU/mL (ref 0.30–0.70)

## 2014-05-28 LAB — APTT: aPTT: 33 seconds (ref 24–37)

## 2014-05-28 LAB — TROPONIN I
TROPONIN I: 0.86 ng/mL — AB (ref <0.031)
Troponin I: 1.08 ng/mL (ref <0.031)

## 2014-05-28 LAB — LIPASE, BLOOD: Lipase: 59 U/L (ref 11–59)

## 2014-05-28 MED ORDER — FENTANYL CITRATE 0.05 MG/ML IJ SOLN
INTRAMUSCULAR | Status: AC | PRN
  Administered 2014-05-28 (×2): 25 ug via INTRAVENOUS

## 2014-05-28 MED ORDER — DIGOXIN 0.25 MG/ML IJ SOLN
0.2500 mg | Freq: Once | INTRAMUSCULAR | Status: AC
  Administered 2014-05-28: 0.25 mg via INTRAVENOUS
  Filled 2014-05-28: qty 1

## 2014-05-28 MED ORDER — IOHEXOL 300 MG/ML  SOLN
50.0000 mL | Freq: Once | INTRAMUSCULAR | Status: AC | PRN
  Administered 2014-05-28: 10 mL

## 2014-05-28 MED ORDER — SODIUM CHLORIDE 0.9 % IV SOLN
Freq: Once | INTRAVENOUS | Status: AC
  Administered 2014-05-28: 16:00:00 via INTRAVENOUS

## 2014-05-28 MED ORDER — MUPIROCIN 2 % EX OINT
1.0000 "application " | TOPICAL_OINTMENT | Freq: Two times a day (BID) | CUTANEOUS | Status: AC
  Administered 2014-05-28 – 2014-06-01 (×10): 1 via NASAL
  Filled 2014-05-28: qty 22

## 2014-05-28 MED ORDER — FENTANYL CITRATE 0.05 MG/ML IJ SOLN
INTRAMUSCULAR | Status: AC
  Filled 2014-05-28: qty 4

## 2014-05-28 MED ORDER — DIGOXIN 0.25 MG/ML IJ SOLN
0.2500 mg | Freq: Once | INTRAMUSCULAR | Status: DC
  Filled 2014-05-28: qty 1

## 2014-05-28 MED ORDER — MIDAZOLAM HCL 2 MG/2ML IJ SOLN
INTRAMUSCULAR | Status: AC
  Filled 2014-05-28: qty 4

## 2014-05-28 MED ORDER — CETYLPYRIDINIUM CHLORIDE 0.05 % MT LIQD
7.0000 mL | Freq: Two times a day (BID) | OROMUCOSAL | Status: DC
  Administered 2014-05-28 – 2014-06-05 (×17): 7 mL via OROMUCOSAL

## 2014-05-28 MED ORDER — LIDOCAINE HCL 1 % IJ SOLN
INTRAMUSCULAR | Status: AC
  Filled 2014-05-28: qty 20

## 2014-05-28 MED ORDER — CHLORHEXIDINE GLUCONATE CLOTH 2 % EX PADS
6.0000 | MEDICATED_PAD | Freq: Every day | CUTANEOUS | Status: AC
  Administered 2014-05-28 – 2014-06-01 (×5): 6 via TOPICAL

## 2014-05-28 MED ORDER — MIDAZOLAM HCL 2 MG/2ML IJ SOLN
INTRAMUSCULAR | Status: AC | PRN
  Administered 2014-05-28: 1 mg via INTRAVENOUS

## 2014-05-28 NOTE — Care Management Note (Addendum)
    Page 1 of 1   05/30/2014     10:54:54 AM CARE MANAGEMENT NOTE 05/30/2014  Patient:  [name], [name]   Account Number:  [id number]  Date Initiated:  05/28/2014  Documentation initiated by:  Junius Creamer  Subjective/Objective Assessment:   adm w cholecystitis     Action/Plan:   lives w fam, pcp dr Sharyn Lull   Anticipated DC Date:     Anticipated DC Plan:  HOME W HOME HEALTH SERVICES  In-house referral  Clinical Social Worker         Choice offered to / List presented to:             Status of service:   Medicare Important Message given?  YES (If response is "NO", the following Medicare IM given date fields will be blank) Date Medicare IM given:  05/30/2014 Medicare IM given by:  Junius Creamer Date Additional Medicare IM given:   Additional Medicare IM given by:    Discharge Disposition:    Per UR Regulation:    If discussed at Long Length of Stay Meetings, dates discussed:    Comments:  12/29 1347 debbie Leoda Smithhart rn,bsn spoke w 2 daughters. fam lives w pt. several of da's work but neice stays w pt. will ask md for phy ther eval once able. fam not sure if will need snf or hhc. will await phy ther eval. have made sw ref in case needed.

## 2014-05-28 NOTE — Progress Notes (Signed)
INITIAL NUTRITION ASSESSMENT  DOCUMENTATION CODES Per approved criteria  -Severe malnutrition in the context of acute illness or injury   INTERVENTION: 1.  General healthful diet; encourage intake of foods and beverages as able once diet advanced.  RD to follow and assess for nutritional adequacy.  2.  Supplements; Resource Breeze po BID, each supplement provides 250 kcal and 9 grams of protein once diet advanced.  NUTRITION DIAGNOSIS: Inadequate oral intake related to anorexia as evidenced by pt report.   Monitor:  1.  Food/Beverage; pt meeting >/=90% estimated needs with tolerance. 2.  Wt/wt change; monitor trends  Reason for Assessment: MST  80 y.o. female  Admitting Dx: weakness, poor appetite  ASSESSMENT: Pt admitted with abdominal pain, feeling weak and tired with poor appetite.  Patient with nausea, vomiting, diarrhea for the past 1 week found to have acute cholecystitis.  Patient is planning to have percutaneuos gallbladder drain placed this AM. Poor candidate for cholecystectomy.   RD met with patient and family who state that for the past 2 weeks patient has been sick with poor appetite and intake. Family states that pt recently weighed 112 lbs at PCP, then had one week of diarrhea, one week of poor appetite with minimal intake.  Family states patient has visibly lost weight. Patient used to drink Ensure at home, but has not recently due to refusal.   Note patient does have some muscle sparing in her lower extremities.  Patient is extremely hungry during visit.  States she wants water and food as soon as possible.  RD reinforced medical plans for today.  Discussed diet advancement with RN.  Recommend Regular diet once medically appropriate.   Nutrition Focused Physical Exam: Subcutaneous Fat:  Orbital Region: severe wasting Upper Arm Region: severe wasting Thoracic and Lumbar Region: severe wasting  Muscle:  Temple Region: severe wasting Clavicle Bone Region: severe  wasting Clavicle and Acromion Bone Region: severe wasting Scapular Bone Region: severe wasting Dorsal Hand: severe wasting Patellar Region: moderate wasting Anterior Thigh Region: moderate-severe wasting Posterior Calf Region: moderate wasting  Edema: none present  Height: Ht Readings from Last 1 Encounters:  09/11/13 5' (1.524 m)    Weight: Wt Readings from Last 1 Encounters:  05/27/14 99 lb 3.3 oz (45 kg)    Ideal Body Weight: 100 lbs  % Ideal Body Weight: 99%  Wt Readings from Last 10 Encounters:  05/27/14 99 lb 3.3 oz (45 kg)  09/11/13 113 lb 12.8 oz (51.619 kg)  01/22/13 108 lb 6.4 oz (49.17 kg)  07/19/12 108 lb 12.8 oz (49.351 kg)  03/24/12 103 lb (46.72 kg)  12/15/11 97 lb 10.6 oz (44.3 kg)  09/02/10 130 lb (58.968 kg)  07/28/10 132 lb (59.875 kg)  02/04/10 126 lb (57.153 kg)  01/27/10 127 lb 12.8 oz (57.97 kg)    Usual Body Weight: highly variable, 105-110 lbs for the past 1-2 years, 112 lbs at last MD visit per family  % Usual Body Weight: 88%  BMI:  Body mass index is 19.38 kg/(m^2).  Estimated Nutritional Needs: Kcal: 1400-1600 Protein: 60-75g Fluid: >1.5 L/day  Skin: intact  Diet Order: Diet NPO time specified  EDUCATION NEEDS: -Education needs addressed   Intake/Output Summary (Last 24 hours) at 05/28/14 0931 Last data filed at 05/28/14 0824  Gross per 24 hour  Intake  771.5 ml  Output      0 ml  Net  771.5 ml    Last BM: 12/28   Labs:   Recent [employer]  05/27/14 1012 05/27/14 2019 05/28/14 0028  NA 141 140 140  K 3.3* 3.5 3.5  CL 109 112 111  CO2 20 18* 19  BUN 49* 52* 52*  CREATININE 1.14* 1.18* 1.22*  CALCIUM 8.8 7.9* 7.8*  GLUCOSE 147* 96 110*    CBG (last 3)  No results for input(s): GLUCAP in the last 72 hours.  Scheduled Meds: . antiseptic oral rinse  7 mL Mouth Rinse BID  . aspirin EC  81 mg Oral Daily  . Chlorhexidine Gluconate Cloth  6 each Topical Daily  . digoxin  0.25 mg Intravenous Once  . diltiazem   10 mg Intravenous Once  . Influenza vac split quadrivalent PF  0.5 mL Intramuscular Tomorrow-1000  . mupirocin ointment  1 application Nasal BID  . pantoprazole (PROTONIX) IV  40 mg Intravenous Q24H  . piperacillin-tazobactam (ZOSYN)  IV  3.375 g Intravenous 3 times per day  . pneumococcal 23 valent vaccine  0.5 mL Intramuscular Tomorrow-1000    Continuous Infusions: . sodium chloride 50 mL/hr at 05/27/14 2110  . diltiazem (CARDIZEM) infusion 5 mg/hr (05/27/14 2109)  . heparin 800 Units/hr (05/28/14 7182)    Past Medical History  Diagnosis Date  . Syncope   . Atypical chest pain     Nonobstructive catheterization; negative Myoview 2011  . CVA 11/23/2008  . HYPERTENSION 11/23/2008  . Coronary artery disease   . 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 Other primary cardiomyopathies   . Dementia 11/23/2008  . Non Q wave myocardial infarction hx    notes 09/11/2013  . GERD (gastroesophageal reflux disease)   . SEIZURE DISORDER 11/23/2008    "she's had blackouts so loop recorder placed" (09/11/2013)  . Arthritis     "hands, legs, back" (09/11/2013)  . Status post placement of implantable loop recorder     Past Surgical History  Procedure Laterality Date  . Loop recorder implant  ?2010  . Colonoscopy    . Eye surgery    . Cataract extraction w/ intraocular lens  implant, bilateral Bilateral   . Laparotomy  11/30/2011    Procedure: EXPLORATORY LAPAROTOMY;  Surgeon: Zenovia Jarred, MD;  Location: [place];  Service: General;  Laterality: N/A;  Exploratory Laparotomy with Primary Repair Obturator Hernia  . Cardiac catheterization  10/2011  . Left heart catheterization with coronary angiogram N/A 11/26/2011    Procedure: LEFT HEART CATHETERIZATION WITH CORONARY ANGIOGRAM;  Surgeon: Clent Demark, MD;  Location: [facility] CATH LAB;  Service: Cardiovascular;  Laterality: N/A;    Brynda Greathouse, MS RD LDN Clinical Inpatient Dietitian Weekend/After hours pager: [phone]

## 2014-05-28 NOTE — Progress Notes (Signed)
Back from radiology, asleep open eyes to touch. Breathing easy and regular. Continue to monitor.

## 2014-05-28 NOTE — Consult Note (Signed)
Reason for consult: percutaneous cholecystostomy  Referring Physician(s): CCS  History of Present Illness: [name] is a 80 y.o. female with history of multiple medical problems including dementia, CVA, HTN, CHF, CAD with MI 08/2013, prior syncope with implanted loop recorder/?seizure d/o,  diverticulosis,  obturator hernia repair 2013. She was recently admitted with new onset afib, demand ischemia, anorexia, abd pain(primarily RUQ), nausea/weakness, dyspnea, and diarrhea. Imaging studies have revealed markedly distended GB with sludge [name] . Pt is poor candidate for cholecystectomy and request now received for percutaneous cholecystostomy.  Past Medical History  Diagnosis Date  . Syncope   . Atypical chest pain     Nonobstructive catheterization; negative Myoview 2011  . CVA 11/23/2008  . HYPERTENSION 11/23/2008  . Coronary artery disease   . 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 Other primary cardiomyopathies   . Dementia 11/23/2008  . Non Q wave myocardial infarction hx    notes 09/11/2013  . GERD (gastroesophageal reflux disease)   . SEIZURE DISORDER 11/23/2008    "she's had blackouts so loop recorder placed" (09/11/2013)  . Arthritis     "hands, legs, back" (09/11/2013)  . Status post placement of implantable loop recorder     Past Surgical History  Procedure Laterality Date  . Loop recorder implant  ?2010  . Colonoscopy    . Eye surgery    . Cataract extraction w/ intraocular lens  implant, bilateral Bilateral   . Laparotomy  11/30/2011    Procedure: EXPLORATORY LAPAROTOMY;  Surgeon: Liz Malady, MD;  Location: [facility] OR;  Service: General;  Laterality: N/A;  Exploratory Laparotomy with Primary Repair Obturator Hernia  . Cardiac catheterization  10/2011  . Left heart catheterization with coronary angiogram N/A 11/26/2011    Procedure: LEFT HEART CATHETERIZATION WITH CORONARY ANGIOGRAM;  Surgeon: Robynn Pane, MD;  Location: [facility] CATH LAB;  Service:  Cardiovascular;  Laterality: N/A;    Allergies: Codeine  Medications: Prior to Admission medications   Medication Sig Start Date End Date Taking? Authorizing Provider  aspirin EC 81 MG EC tablet Take 1 tablet (81 mg total) by mouth daily. 01/22/13  Yes Robynn Pane, MD  donepezil (ARICEPT) 5 MG tablet Take 5 mg by mouth at bedtime.   Yes Historical Provider, MD  lisinopril (PRINIVIL,ZESTRIL) 5 MG tablet Take 2.5 mg by mouth daily.   Yes Historical Provider, MD  omeprazole (PRILOSEC) 20 MG capsule Take 20 mg by mouth daily.   Yes Historical Provider, MD  polyethylene glycol (MIRALAX / GLYCOLAX) packet Take 17 g by mouth every other day.    Yes Historical Provider, MD  potassium chloride SA (K-DUR,KLOR-CON) 20 MEQ tablet Take 20 mEq by mouth daily.   Yes Historical Provider, MD  simvastatin (ZOCOR) 20 MG tablet Take 20 mg by mouth every evening.   Yes Historical Provider, MD  feeding supplement (ENSURE COMPLETE) LIQD Take 237 mLs by mouth 2 (two) times daily between meals. Patient not taking: Reported on 05/27/2014 12/15/11   Robynn Pane, MD  ferrous gluconate (FERGON) 324 MG tablet Take 1 tablet (324 mg total) by mouth daily with breakfast. Patient not taking: Reported on 05/27/2014 09/13/13   Robynn Pane, MD    Family History  Problem Relation Age of Onset  . Heart failure Neg Hx   . Stroke    . Heart disease      History   Social History  . Marital Status: Widowed    Spouse Name: N/A  Number of Children: N/A  . Years of Education: N/A   Occupational History  . sitter    Social History Main Topics  . Smoking status: Former Smoker    Types: Cigarettes  . Smokeless tobacco: Never Used     Comment: 09/11/2013 "stopped smoking in the 1960's; only puffed occasionally when I did smoke; didn't smoke long"  . Alcohol Use: No  . Drug Use: No  . Sexual Activity: No   Other Topics Concern  . None   Social History Narrative   Widowed      Review of Systems see  above  Vital Signs: BP 100/70 mmHg  Pulse 88  Temp(Src) 97.2 F (36.2 C) (Oral)  Resp 18  Wt 99 lb 3.3 oz (45 kg)  SpO2 100%  Physical Exam pt awake/answers questions, follows commands; chest- CTA bilat; heart- irreg, nl rate, soft murmur; abd- soft,+BS, sl tender RUQ to palpation; ext- no edema  Imaging: Ct Abdomen Pelvis W Contrast  05/27/2014   CLINICAL DATA:  Abdominal pain. Nausea, vomiting. Decreased p.o. intake and shortness of breath.  EXAM: CT ABDOMEN AND PELVIS WITH CONTRAST  TECHNIQUE: Multidetector CT imaging of the abdomen and pelvis was performed using the standard protocol following bolus administration of intravenous contrast.  CONTRAST:  80mL OMNIPAQUE IOHEXOL 300 MG/ML  SOLN  COMPARISON:  CT of the abdomen and pelvis 01/20/2013  FINDINGS: Lower chest: There are fibrotic changes at the lung bases. Heart is markedly enlarged. Coronary artery and mitral annulus calcifications are present. No pericardial effusion.  Upper abdomen: There is a diffusely heterogeneous appearance of the liver without discrete lesions. Findings are consistent with hepatic congestion. No focal abnormality identified within the spleen, pancreas. Bilateral adrenal lesions appear stable over prior studies, measuring 1.4 cm on the right and 3.5 cm on the left. Gallbladder is markedly distended and measures 17 cm in length. There is no CT evidence for stones or pericholecystic fluid.  Gastrointestinal tract: The stomach and small bowel loops are normal in caliber. There is significant stranding surrounding the ascending colon which contains numerous diverticula. Colonic wall in this region is thickened and associated with numerous diverticula. No evidence for perforation. The appendix is well seen and has a normal appearance.  Pelvis: The uterus is surgically absent. No adnexal mass. Adjacent to the left aspect of the base the bladder there is a small fluid-filled structure of likely representing a small bladder  diverticulum.  Retroperitoneum: No retroperitoneal or mesenteric adenopathy.  Abdominal wall: There is diffuse body wall edema. 5.2 x 1.3 cm lipoma identified in the right mid abdominal subcutaneous tissues.  Osseous structures: Scoliosis. No suspicious lytic or blastic lesions are identified.  IMPRESSION: 1. Marked distension of the gallbladder. Ultrasound may be helpful for further evaluation regarding the duct and possible gallstones. 2. Hepatic congestion with diffusely mottled appearance of the liver parenchyma. 3. Stable bilateral adrenal nodules. 4. Significant diverticulosis. Inflammation associated with the ascending colon favored to be related to diverticulitis. Malignancy should also be considered and followup is recommended. Colonoscopy and/or contrast enema may be helpful for further evaluation following clinical improvement. 5. Diffuse body wall edema. 6. Cardiomegaly.  Mitral annulus and coronary calcifications.   Electronically Signed   By: Rosalie GumsBeth  Brown M.D.   On: 05/27/2014 13:39   [place] Abdomen Limited  05/27/2014   CLINICAL DATA:  Abdominal pain. Follow-up distended gallbladder on CT.  EXAM: US ABDOMEN LIMITED - RIGHT UPPER QUADRANT  COMPARISON:  CT of the abdomen and pelvis May 27, 2014 at 1303 hr  FINDINGS: Gallbladder:  Layering gallbladder sludge, tiny echogenic gallstones with acoustic shadowing. Gallbladder distention without wall thickening or pericholecystic fluid. No sonographic Murphy's sign elicited.  Common bile duct:  Diameter: 5 mm  Liver:  No focal lesion identified. Within normal limits in parenchymal echogenicity. Hepatopetal portal vein.  IMPRESSION: Distended gallbladder with sludge/ cholelithiasis, no superimposed findings of acute cholecystitis.   Electronically Signed   By: Awilda Metro   On: 05/27/2014 15:01   Dg Chest Port 1 View  05/27/2014   CLINICAL DATA:  Mid chest and abdominal pain since Saturday. Intermittent cough.  EXAM: PORTABLE CHEST - 1 VIEW   COMPARISON:  01/20/2013.  FINDINGS: Trachea is midline. Heart size stable. The loop recorder projects over the left heart. Linear scarring or thickening of the minor fissure in the mid right hemi thorax. Probable scarring at both lung bases. Lungs are otherwise clear. No pleural fluid. Right hemidiaphragm is rather elevated, as before. Calcifications in the right upper quadrant may represent gallstones.  IMPRESSION: No acute findings.   Electronically Signed   By: Leanna Battles M.D.   On: 05/27/2014 11:08    Labs:  CBC:  Recent Labs  09/12/13 0535 05/27/14 1012 05/27/14 2019 05/28/14 0028  WBC 5.3 8.8 6.7 6.4  HGB 8.6* 12.8 10.2* 9.9*  HCT 26.0* 38.7 30.9* 31.7*  PLT 228 195 159 205    COAGS:  Recent Labs  09/11/13 1820  INR 1.03    BMP:  Recent Labs  09/12/13 0535 05/27/14 1012 05/27/14 2019 05/28/14 0028  NA 139 141 140 140  K 4.7 3.3* 3.5 3.5  CL 103 109 112 111  CO2 21 20 18* 19  GLUCOSE 87 147* 96 110*  BUN 47* 49* 52* 52*  CALCIUM 8.7 8.8 7.9* 7.8*  CREATININE 1.26* 1.14* 1.18* 1.22*  GFRNONAA 39* 44* 42* 40*  GFRAA 45* 50* 48* 46*    LIVER FUNCTION TESTS:  Recent Labs  09/11/13 1820 05/27/14 1012 05/27/14 2019 05/28/14 0028  BILITOT 0.4 2.8* 1.4* 1.1  AST 32 420* 271* 245*  ALT 13 579* 431* 387*  ALKPHOS 71 167* 126* 122*  PROT 7.6 9.5* 6.7 6.6  ALBUMIN 3.3* 4.0 3.0* 2.9*    TUMOR MARKERS: No results for input(s): AFPTM, CEA, CA199, CHROMGRNA in the last 8760 hours.  Assessment and Plan: TYRISHA BENNINGER is a 80 y.o. female with history of multiple medical problems including dementia, CVA, HTN, CHF, CAD with MI 08/2013, prior syncope with implanted loop recorder/?seizure d/o,  diverticulosis,  obturator hernia repair 2013. She was recently admitted with new onset afib, demand ischemia, anorexia, abd pain(primarily RUQ), nausea/weakness, dyspnea, and diarrhea. Imaging studies have revealed markedly distended GB with sludge [name] . Pt is poor  candidate for cholecystectomy and request now received for percutaneous cholecystostomy. Imaging has been reviewed by Dr. Archer Asa. Details/risks of procedure d/w pt/daughter, Ledon Snare, with her understanding and consent. IV heparin has been stopped this am. Will plan to do procedure in next 2-4 hours.    I spent a total of 40 minutes face to face in clinical consultation, greater than 50% of which was counseling/coordinating care for percutaneous cholecystostomy.  Signed: Chinita Pester 05/28/2014, 8:45 AM

## 2014-05-28 NOTE — Progress Notes (Signed)
ANTICOAGULATION CONSULT NOTE - Follow Up Consult  Pharmacy Consult for Heparin  Indication: atrial fibrillation  Allergies  Allergen Reactions  . Codeine Other (See Comments)    UNKNOWN; Per [facility]    Patient Measurements: Weight: 99 lb 3.3 oz (45 kg)  Vital Signs: Temp: 97.8 F (36.6 C) (12/28 2300) Temp Source: Oral (12/28 2300) BP: 89/57 mmHg (12/29 0300)  Labs:  Recent Labs  05/27/14 1012 05/27/14 1039 05/27/14 2019 05/28/14 0028 05/28/14 0305  HGB 12.8  --  10.2* 9.9*  --   HCT 38.7  --  30.9* 31.7*  --   PLT 195  --  159 205  --   HEPARINUNFRC  --   --   --   --  0.10*  CREATININE 1.14*  --  1.18* 1.22*  --   TROPONINI  --  1.30* 1.19* 1.08*  --     Assessment: Sub-therapeutic heparin level, no issues per RN.   Goal of Therapy:  Heparin level 0.3-0.7 units/ml Monitor platelets by anticoagulation protocol: Yes   Plan:  -Increase heparin drip to 800 units/hr -1300 HL -Daily CBC/HL -Monitor for bleeding  Abran Duke 05/28/2014,4:36 AM

## 2014-05-28 NOTE — Progress Notes (Signed)
Subjective: No pain  Objective: Vital signs in last 24 hours: Temp:  [97.2 F (36.2 C)-99.1 F (37.3 C)] 97.2 F (36.2 C) (12/29 0800) Pulse Rate:  [88-143] 88 (12/29 0803) Resp:  [16-36] 18 (12/29 0803) BP: (67-128)/(50-91) 100/70 mmHg (12/29 0803) SpO2:  [90 %-100 %] 100 % (12/29 0803) Weight:  [99 lb 3.3 oz (45 kg)] 99 lb 3.3 oz (45 kg) (12/28 2200) Last BM Date: 05/27/14  Intake/Output from previous day: 12/28 0701 - 12/29 0700 In: 708.5 [I.V.:608.5; IV Piggyback:100] Out: -  Intake/Output this shift: Total I/O In: 63 [I.V.:63] Out: -   General appearance: cooperative Resp: clear to auscultation bilaterally Cardio: irregularly irregular rhythm GI: NT  Lab Results:   Recent Labs  05/27/14 2019 05/28/14 0028  WBC 6.7 6.4  HGB 10.2* 9.9*  HCT 30.9* 31.7*  PLT 159 205   BMET  Recent Labs  05/27/14 2019 05/28/14 0028  NA 140 140  K 3.5 3.5  CL 112 111  CO2 18* 19  GLUCOSE 96 110*  BUN 52* 52*  CREATININE 1.18* 1.22*  CALCIUM 7.9* 7.8*   PT/INR No results for input(s): LABPROT, INR in the last 72 hours. ABG  Recent Labs  05/27/14 1122  PHART 7.315*  HCO3 17.4*    Studies/Results: Ct Abdomen Pelvis W Contrast  05/27/2014   CLINICAL DATA:  Abdominal pain. Nausea, vomiting. Decreased p.o. intake and shortness of breath.  EXAM: CT ABDOMEN AND PELVIS WITH CONTRAST  TECHNIQUE: Multidetector CT imaging of the abdomen and pelvis was performed using the standard protocol following bolus administration of intravenous contrast.  CONTRAST:  65mL OMNIPAQUE IOHEXOL 300 MG/ML  SOLN  COMPARISON:  CT of the abdomen and pelvis 01/20/2013  FINDINGS: Lower chest: There are fibrotic changes at the lung bases. Heart is markedly enlarged. Coronary artery and mitral annulus calcifications are present. No pericardial effusion.  Upper abdomen: There is a diffusely heterogeneous appearance of the liver without discrete lesions. Findings are consistent with hepatic  congestion. No focal abnormality identified within the spleen, pancreas. Bilateral adrenal lesions appear stable over prior studies, measuring 1.4 cm on the right and 3.5 cm on the left. Gallbladder is markedly distended and measures 17 cm in length. There is no CT evidence for stones or pericholecystic fluid.  Gastrointestinal tract: The stomach and small bowel loops are normal in caliber. There is significant stranding surrounding the ascending colon which contains numerous diverticula. Colonic wall in this region is thickened and associated with numerous diverticula. No evidence for perforation. The appendix is well seen and has a normal appearance.  Pelvis: The uterus is surgically absent. No adnexal mass. Adjacent to the left aspect of the base the bladder there is a small fluid-filled structure of likely representing a small bladder diverticulum.  Retroperitoneum: No retroperitoneal or mesenteric adenopathy.  Abdominal wall: There is diffuse body wall edema. 5.2 x 1.3 cm lipoma identified in the right mid abdominal subcutaneous tissues.  Osseous structures: Scoliosis. No suspicious lytic or blastic lesions are identified.  IMPRESSION: 1. Marked distension of the gallbladder. Ultrasound may be helpful for further evaluation regarding the duct and possible gallstones. 2. Hepatic congestion with diffusely mottled appearance of the liver parenchyma. 3. Stable bilateral adrenal nodules. 4. Significant diverticulosis. Inflammation associated with the ascending colon favored to be related to diverticulitis. Malignancy should also be considered and followup is recommended. Colonoscopy and/or contrast enema may be helpful for further evaluation following clinical improvement. 5. Diffuse body wall edema. 6. Cardiomegaly.  Mitral annulus and  coronary calcifications.   Electronically Signed   By: Rosalie GumsBeth  Brown M.D.   On: 05/27/2014 13:39   [place] Abdomen Limited  05/27/2014   CLINICAL DATA:  Abdominal pain. Follow-up  distended gallbladder on CT.  EXAM: US ABDOMEN LIMITED - RIGHT UPPER QUADRANT  COMPARISON:  CT of the abdomen and pelvis May 27, 2014 at 1303 hr  FINDINGS: Gallbladder:  Layering gallbladder sludge, tiny echogenic gallstones with acoustic shadowing. Gallbladder distention without wall thickening or pericholecystic fluid. No sonographic Murphy's sign elicited.  Common bile duct:  Diameter: 5 mm  Liver:  No focal lesion identified. Within normal limits in parenchymal echogenicity. Hepatopetal portal vein.  IMPRESSION: Distended gallbladder with sludge/ cholelithiasis, no superimposed findings of acute cholecystitis.   Electronically Signed   By: Awilda Metroourtnay  Bloomer   On: 05/27/2014 15:01   Dg Chest Port 1 View  05/27/2014   CLINICAL DATA:  Mid chest and abdominal pain since Saturday. Intermittent cough.  EXAM: PORTABLE CHEST - 1 VIEW  COMPARISON:  01/20/2013.  FINDINGS: Trachea is midline. Heart size stable. The loop recorder projects over the left heart. Linear scarring or thickening of the minor fissure in the mid right hemi thorax. Probable scarring at both lung bases. Lungs are otherwise clear. No pleural fluid. Right hemidiaphragm is rather elevated, as before. Calcifications in the right upper quadrant may represent gallstones.  IMPRESSION: No acute findings.   Electronically Signed   By: Leanna BattlesMelinda  Blietz M.D.   On: 05/27/2014 11:08    Anti-infectives: Anti-infectives    Start     Dose/Rate Route Frequency Ordered Stop   05/27/14 2200  piperacillin-tazobactam (ZOSYN) IVPB 3.375 g     3.375 g12.5 mL/hr over 240 Minutes Intravenous 3 times per day 05/27/14 1409     05/27/14 1415  piperacillin-tazobactam (ZOSYN) IVPB 3.375 g     3.375 g100 mL/hr over 30 Minutes Intravenous  Once 05/27/14 1409 05/27/14 1456      Assessment/Plan: Cholecystitis - for perc drain during heparin window. I arranged with Dr. Archer AsaMcCullough AF RVR - per Dr. Sharyn LullHarwani I spoke with her daughter   LOS: 1 day     Kieth Hartis E 05/28/2014

## 2014-05-28 NOTE — Progress Notes (Signed)
Bp- 70's systolic, on both arms, asymptomatic, cardizem stopped. MD aware, ns bolus 250 ml given, continue to monitor.

## 2014-05-28 NOTE — Procedures (Signed)
Interventional Radiology Procedure Note  Procedure: Placement of a 49F percutaneous cholecystostomy tube.  Complications: None Recommendations: - Maintain tube to gravity drainage - Bile sent for culture - Return to IR in 6-8 weeks for tube check/change unless interval cholecystectomy  Signed,  Sterling Big, MD

## 2014-05-28 NOTE — Progress Notes (Signed)
[facility] PROGRESS NOTE Subjective Pt had placement of percutaneous cholecystostomy tube.  Objective: Vital signs in last 24 hours: Temp:  [97 F (36.1 C)-97.8 F (36.6 C)] 97 F (36.1 C) (12/29 1331) Pulse Rate:  [73-124] 87 (12/29 1334) Resp:  [12-34] 14 (12/29 1400) BP: (67-106)/(50-79) 105/58 mmHg (12/29 1400) SpO2:  [90 %-100 %] 100 % (12/29 1400) Weight:  [45 kg (99 lb 3.3 oz)] 45 kg (99 lb 3.3 oz) (12/28 2200) Last BM Date: 05/27/14  Intake/Output from previous day: 12/28 0701 - 12/29 0700 In: 708.5 [I.V.:608.5; IV Piggyback:100] Out: -  Intake/Output this shift: Total I/O In: 338 [I.V.:338] Out: -   PE: General--minimally responsive  Abdomen--tube draining thick brown  Bile minimally tender  Lab Results:  Recent Labs  05/27/14 1012 05/27/14 2019 05/28/14 0028  WBC 8.8 6.7 6.4  HGB 12.8 10.2* 9.9*  HCT 38.7 30.9* 31.7*  PLT 195 159 205   BMET  Recent Labs  05/27/14 1012 05/27/14 2019 05/28/14 0028  NA 141 140 140  K 3.3* 3.5 3.5  CL 109 112 111  CO2 20 18* 19  CREATININE 1.14* 1.18* 1.22*   LFT  Recent Labs  05/27/14 1012 05/27/14 2019 05/28/14 0028  PROT 9.5* 6.7 6.6  AST 420* 271* 245*  ALT 579* 431* 387*  ALKPHOS 167* 126* 122*  BILITOT 2.8* 1.4* 1.1   PT/INR  Recent Labs  05/28/14 0950  LABPROT 17.6*  INR 1.43   PANCREAS  Recent Labs  05/27/14 1039 05/28/14 0028  LIPASE 67* 59         Studies/Results: Ct Abdomen Pelvis W Contrast  05/27/2014   CLINICAL DATA:  Abdominal pain. Nausea, vomiting. Decreased p.o. intake and shortness of breath.  EXAM: CT ABDOMEN AND PELVIS WITH CONTRAST  TECHNIQUE: Multidetector CT imaging of the abdomen and pelvis was performed using the standard protocol following bolus administration of intravenous contrast.  CONTRAST:  80mL OMNIPAQUE IOHEXOL 300 MG/ML  SOLN  COMPARISON:  CT of the abdomen and pelvis 01/20/2013  FINDINGS: Lower chest: There are fibrotic changes at the  lung bases. Heart is markedly enlarged. Coronary artery and mitral annulus calcifications are present. No pericardial effusion.  Upper abdomen: There is a diffusely heterogeneous appearance of the liver without discrete lesions. Findings are consistent with hepatic congestion. No focal abnormality identified within the spleen, pancreas. Bilateral adrenal lesions appear stable over prior studies, measuring 1.4 cm on the right and 3.5 cm on the left. Gallbladder is markedly distended and measures 17 cm in length. There is no CT evidence for stones or pericholecystic fluid.  Gastrointestinal tract: The stomach and small bowel loops are normal in caliber. There is significant stranding surrounding the ascending colon which contains numerous diverticula. Colonic wall in this region is thickened and associated with numerous diverticula. No evidence for perforation. The appendix is well seen and has a normal appearance.  Pelvis: The uterus is surgically absent. No adnexal mass. Adjacent to the left aspect of the base the bladder there is a small fluid-filled structure of likely representing a small bladder diverticulum.  Retroperitoneum: No retroperitoneal or mesenteric adenopathy.  Abdominal wall: There is diffuse body wall edema. 5.2 x 1.3 cm lipoma identified in the right mid abdominal subcutaneous tissues.  Osseous structures: Scoliosis. No suspicious lytic or blastic lesions are identified.  IMPRESSION: 1. Marked distension of the gallbladder. Ultrasound may be helpful for further evaluation regarding the duct and possible gallstones. 2. Hepatic congestion with diffusely mottled appearance of the liver parenchyma.  3. Stable bilateral adrenal nodules. 4. Significant diverticulosis. Inflammation associated with the ascending colon favored to be related to diverticulitis. Malignancy should also be considered and followup is recommended. Colonoscopy and/or contrast enema may be helpful for further evaluation following  clinical improvement. 5. Diffuse body wall edema. 6. Cardiomegaly.  Mitral annulus and coronary calcifications.   Electronically Signed   By: Rosalie Gums M.D.   On: 05/27/2014 13:39   US Abdomen Limited  05/27/2014   CLINICAL DATA:  Abdominal pain. Follow-up distended gallbladder on CT.  EXAM: US ABDOMEN LIMITED - RIGHT UPPER QUADRANT  COMPARISON:  CT of the abdomen and pelvis May 27, 2014 at 1303 hr  FINDINGS: Gallbladder:  Layering gallbladder sludge, tiny echogenic gallstones with acoustic shadowing. Gallbladder distention without wall thickening or pericholecystic fluid. No sonographic Murphy's sign elicited.  Common bile duct:  Diameter: 5 mm  Liver:  No focal lesion identified. Within normal limits in parenchymal echogenicity. Hepatopetal portal vein.  IMPRESSION: Distended gallbladder with sludge/ cholelithiasis, no superimposed findings of acute cholecystitis.   Electronically Signed   By: Awilda Metro   On: 05/27/2014 15:01   Dg Chest Port 1 View  05/27/2014   CLINICAL DATA:  Mid chest and abdominal pain since Saturday. Intermittent cough.  EXAM: PORTABLE CHEST - 1 VIEW  COMPARISON:  01/20/2013.  FINDINGS: Trachea is midline. Heart size stable. The loop recorder projects over the left heart. Linear scarring or thickening of the minor fissure in the mid right hemi thorax. Probable scarring at both lung bases. Lungs are otherwise clear. No pleural fluid. Right hemidiaphragm is rather elevated, as before. Calcifications in the right upper quadrant may represent gallstones.  IMPRESSION: No acute findings.   Electronically Signed   By: Leanna Battles M.D.   On: 05/27/2014 11:08    Medications: I have reviewed the patient's current medications.  Assessment/Plan 1. Cholecystitis. Percut tube draining well. Will be available if ERCP needed at some point in future.    Luzmaria Devaux JR,Lela Gell L 05/28/2014, 2:49 PM

## 2014-05-28 NOTE — Progress Notes (Signed)
Subjective:  Patient denies any chest pain or shortness of breath. Denies abdominal pain. States overall feels better. Denies fever or chills . Patient schedule for cholecystostomy today. Remains in atrial fibrillation with controlled ventricular response. Cardiac enzymes are trending down. Patient had similar presentation with elevated troponin I and CPK-MB in the past had cardiac cath 2 noted to have only mild epicardial CAD. 2-D echo is pending Objective:  Vital Signs in the last 24 hours: Temp:  [97.2 F (36.2 C)-99.1 F (37.3 C)] 97.2 F (36.2 C) (12/29 0800) Pulse Rate:  [88-143] 88 (12/29 0803) Resp:  [16-36] 18 (12/29 0803) BP: (67-128)/(50-91) 100/70 mmHg (12/29 0803) SpO2:  [90 %-100 %] 100 % (12/29 0803) Weight:  [45 kg (99 lb 3.3 oz)] 45 kg (99 lb 3.3 oz) (12/28 2200)  Intake/Output from previous day: 12/28 0701 - 12/29 0700 In: 708.5 [I.V.:608.5; IV Piggyback:100] Out: -  Intake/Output from this shift: Total I/O In: 63 [I.V.:63] Out: -   Physical Exam: Neck: no adenopathy, no carotid bruit, no JVD and supple, symmetrical, trachea midline Lungs: clear to auscultation bilaterally Heart: irregularly irregular rhythm, S1, S2 normal and Soft systolic murmur noted no S3 gallop Abdomen: soft, non-tender; bowel sounds normal; no masses,  no organomegaly Extremities: extremities normal, atraumatic, no cyanosis or edema  Lab Results:  Recent Labs  05/27/14 2019 05/28/14 0028  WBC 6.7 6.4  HGB 10.2* 9.9*  PLT 159 205    Recent Labs  05/27/14 2019 05/28/14 0028  NA 140 140  K 3.5 3.5  CL 112 111  CO2 18* 19  GLUCOSE 96 110*  BUN 52* 52*  CREATININE 1.18* 1.22*    Recent Labs  05/28/14 0028 05/28/14 0655  TROPONINI 1.08* 0.86*   Hepatic Function Panel  Recent Labs  05/28/14 0028  PROT 6.6  ALBUMIN 2.9*  AST 245*  ALT 387*  ALKPHOS 122*  BILITOT 1.1    Recent Labs  05/28/14 0305  CHOL 119   No results for input(s): PROTIME in the last 72  hours.  Imaging: Imaging results have been reviewed and Ct Abdomen Pelvis W Contrast  05/27/2014   CLINICAL DATA:  Abdominal pain. Nausea, vomiting. Decreased p.o. intake and shortness of breath.  EXAM: CT ABDOMEN AND PELVIS WITH CONTRAST  TECHNIQUE: Multidetector CT imaging of the abdomen and pelvis was performed using the standard protocol following bolus administration of intravenous contrast.  CONTRAST:  80mL OMNIPAQUE IOHEXOL 300 MG/ML  SOLN  COMPARISON:  CT of the abdomen and pelvis 01/20/2013  FINDINGS: Lower chest: There are fibrotic changes at the lung bases. Heart is markedly enlarged. Coronary artery and mitral annulus calcifications are present. No pericardial effusion.  Upper abdomen: There is a diffusely heterogeneous appearance of the liver without discrete lesions. Findings are consistent with hepatic congestion. No focal abnormality identified within the spleen, pancreas. Bilateral adrenal lesions appear stable over prior studies, measuring 1.4 cm on the right and 3.5 cm on the left. Gallbladder is markedly distended and measures 17 cm in length. There is no CT evidence for stones or pericholecystic fluid.  Gastrointestinal tract: The stomach and small bowel loops are normal in caliber. There is significant stranding surrounding the ascending colon which contains numerous diverticula. Colonic wall in this region is thickened and associated with numerous diverticula. No evidence for perforation. The appendix is well seen and has a normal appearance.  Pelvis: The uterus is surgically absent. No adnexal mass. Adjacent to the left aspect of the base the bladder there is  a small fluid-filled structure of likely representing a small bladder diverticulum.  Retroperitoneum: No retroperitoneal or mesenteric adenopathy.  Abdominal wall: There is diffuse body wall edema. 5.2 x 1.3 cm lipoma identified in the right mid abdominal subcutaneous tissues.  Osseous structures: Scoliosis. No suspicious lytic or  blastic lesions are identified.  IMPRESSION: 1. Marked distension of the gallbladder. Ultrasound may be helpful for further evaluation regarding the duct and possible gallstones. 2. Hepatic congestion with diffusely mottled appearance of the liver parenchyma. 3. Stable bilateral adrenal nodules. 4. Significant diverticulosis. Inflammation associated with the ascending colon favored to be related to diverticulitis. Malignancy should also be considered and followup is recommended. Colonoscopy and/or contrast enema may be helpful for further evaluation following clinical improvement. 5. Diffuse body wall edema. 6. Cardiomegaly.  Mitral annulus and coronary calcifications.   Electronically Signed   By: Rosalie Gums M.D.   On: 05/27/2014 13:39   US Abdomen Limited  05/27/2014   CLINICAL DATA:  Abdominal pain. Follow-up distended gallbladder on CT.  EXAM: US ABDOMEN LIMITED - RIGHT UPPER QUADRANT  COMPARISON:  CT of the abdomen and pelvis May 27, 2014 at 1303 hr  FINDINGS: Gallbladder:  Layering gallbladder sludge, tiny echogenic gallstones with acoustic shadowing. Gallbladder distention without wall thickening or pericholecystic fluid. No sonographic Murphy's sign elicited.  Common bile duct:  Diameter: 5 mm  Liver:  No focal lesion identified. Within normal limits in parenchymal echogenicity. Hepatopetal portal vein.  IMPRESSION: Distended gallbladder with sludge/ cholelithiasis, no superimposed findings of acute cholecystitis.   Electronically Signed   By: Awilda Metro   On: 05/27/2014 15:01   Dg Chest Port 1 View  05/27/2014   CLINICAL DATA:  Mid chest and abdominal pain since Saturday. Intermittent cough.  EXAM: PORTABLE CHEST - 1 VIEW  COMPARISON:  01/20/2013.  FINDINGS: Trachea is midline. Heart size stable. The loop recorder projects over the left heart. Linear scarring or thickening of the minor fissure in the mid right hemi thorax. Probable scarring at both lung bases. Lungs are otherwise clear.  No pleural fluid. Right hemidiaphragm is rather elevated, as before. Calcifications in the right upper quadrant may represent gallstones.  IMPRESSION: No acute findings.   Electronically Signed   By: Leanna Battles M.D.   On: 05/27/2014 11:08    Cardiac Studies:  Assessment/Plan:  Massively distended gallbladder with cholelithiasis/possibly early cholecystitis New-onset A. fib with controlled ventricular response Compensated CHF secondary to preserved LV systolic function History of non-Q-wave MI in the past secondary to demand ischemia Mildly elevated troponin I secondary to demand ischemia doubt significant MI as patient has similar presentation in the past and noted to have mild CAD. History of recurrent syncope in the past status post loop recorder hypertension Hypercholesteremia Osteoarthritis Anemia GERD Dementia   history of questionable seizure disorder Plan Continue present management  Restart heparin after cholecystostomy per pharmacy. Check 2-D echo Check labs in a.m.  LOS: 1 day    Marshal Eskew N 05/28/2014, 8:32 AM

## 2014-05-28 NOTE — Progress Notes (Signed)
Interventional radiology called and claimed to stop heparin for procedure today.

## 2014-05-28 NOTE — Progress Notes (Signed)
  Echocardiogram 2D Echocardiogram has been performed.  Delcie Roch 05/28/2014, 9:53 AM

## 2014-05-29 LAB — CBC
HCT: 34.2 % — ABNORMAL LOW (ref 36.0–46.0)
Hemoglobin: 11.1 g/dL — ABNORMAL LOW (ref 12.0–15.0)
MCH: 23.1 pg — AB (ref 26.0–34.0)
MCHC: 32.5 g/dL (ref 30.0–36.0)
MCV: 71.1 fL — ABNORMAL LOW (ref 78.0–100.0)
PLATELETS: 151 10*3/uL (ref 150–400)
RBC: 4.81 MIL/uL (ref 3.87–5.11)
RDW: 22.1 % — ABNORMAL HIGH (ref 11.5–15.5)
WBC: 11.8 10*3/uL — ABNORMAL HIGH (ref 4.0–10.5)

## 2014-05-29 LAB — COMPREHENSIVE METABOLIC PANEL
ALT: 269 U/L — ABNORMAL HIGH (ref 0–35)
AST: 125 U/L — AB (ref 0–37)
Albumin: 2.6 g/dL — ABNORMAL LOW (ref 3.5–5.2)
Alkaline Phosphatase: 87 U/L (ref 39–117)
Anion gap: 9 (ref 5–15)
BUN: 43 mg/dL — ABNORMAL HIGH (ref 6–23)
CALCIUM: 7.7 mg/dL — AB (ref 8.4–10.5)
CHLORIDE: 111 meq/L (ref 96–112)
CO2: 18 mmol/L — ABNORMAL LOW (ref 19–32)
CREATININE: 1.11 mg/dL — AB (ref 0.50–1.10)
GFR calc Af Amer: 52 mL/min — ABNORMAL LOW (ref >90)
GFR calc non Af Amer: 45 mL/min — ABNORMAL LOW (ref >90)
Glucose, Bld: 96 mg/dL (ref 70–99)
Potassium: 3.6 mmol/L (ref 3.5–5.1)
Sodium: 138 mmol/L (ref 135–145)
Total Bilirubin: 1.2 mg/dL (ref 0.3–1.2)
Total Protein: 6.3 g/dL (ref 6.0–8.3)

## 2014-05-29 LAB — HEPARIN LEVEL (UNFRACTIONATED): HEPARIN UNFRACTIONATED: 0.33 [IU]/mL (ref 0.30–0.70)

## 2014-05-29 MED ORDER — BOOST / RESOURCE BREEZE PO LIQD
1.0000 | Freq: Two times a day (BID) | ORAL | Status: DC
  Administered 2014-05-29 – 2014-06-05 (×15): 1 via ORAL

## 2014-05-29 MED ORDER — PNEUMOCOCCAL VAC POLYVALENT 25 MCG/0.5ML IJ INJ
0.5000 mL | INJECTION | INTRAMUSCULAR | Status: AC
  Administered 2014-05-30: 0.5 mL via INTRAMUSCULAR
  Filled 2014-05-29: qty 0.5

## 2014-05-29 MED ORDER — DILTIAZEM HCL 30 MG PO TABS
30.0000 mg | ORAL_TABLET | Freq: Four times a day (QID) | ORAL | Status: DC
Start: 2014-05-29 — End: 2014-06-04
  Administered 2014-05-29 – 2014-06-03 (×22): 30 mg via ORAL
  Filled 2014-05-29 (×23): qty 1

## 2014-05-29 MED ORDER — INFLUENZA VAC SPLIT QUAD 0.5 ML IM SUSY
0.5000 mL | PREFILLED_SYRINGE | INTRAMUSCULAR | Status: AC
  Administered 2014-05-30: 0.5 mL via INTRAMUSCULAR
  Filled 2014-05-29: qty 0.5

## 2014-05-29 NOTE — Progress Notes (Signed)
Subjective: Pt feels ok. Some soreness at drain site  Objective: Physical Exam: BP 103/66 mmHg  Pulse 108  Temp(Src) 97.7 F (36.5 C) (Oral)  Resp 23  Ht 5\' 4"  (1.626 m)  Wt 99 lb 3.3 oz (45 kg)  BMI 17.02 kg/m2  SpO2 100% RUQ perc chole drain intact. Site clean, NT Dark bilious output noted.    Labs: CBC  Recent Labs  05/28/14 0028 05/29/14 0301  WBC 6.4 11.8*  HGB 9.9* 11.1*  HCT 31.7* 34.2*  PLT 205 151   BMET  Recent Labs  05/28/14 0028 05/29/14 0301  NA 140 138  K 3.5 3.6  CL 111 111  CO2 19 18*  GLUCOSE 110* 96  BUN 52* 43*  CREATININE 1.22* 1.11*  CALCIUM 7.8* 7.7*   LFT  Recent Labs  05/28/14 0028 05/29/14 0301  PROT 6.6 6.3  ALBUMIN 2.9* 2.6*  AST 245* 125*  ALT 387* 269*  ALKPHOS 122* 87  BILITOT 1.1 1.2  LIPASE 59  --    PT/INR  Recent Labs  05/28/14 0950  LABPROT 17.6*  INR 1.43     Studies/Results: Ct Abdomen Pelvis W Contrast  05/27/2014   CLINICAL DATA:  Abdominal pain. Nausea, vomiting. Decreased p.o. intake and shortness of breath.  EXAM: CT ABDOMEN AND PELVIS WITH CONTRAST  TECHNIQUE: Multidetector CT imaging of the abdomen and pelvis was performed using the standard protocol following bolus administration of intravenous contrast.  CONTRAST:  35mL OMNIPAQUE IOHEXOL 300 MG/ML  SOLN  COMPARISON:  CT of the abdomen and pelvis 01/20/2013  FINDINGS: Lower chest: There are fibrotic changes at the lung bases. Heart is markedly enlarged. Coronary artery and mitral annulus calcifications are present. No pericardial effusion.  Upper abdomen: There is a diffusely heterogeneous appearance of the liver without discrete lesions. Findings are consistent with hepatic congestion. No focal abnormality identified within the spleen, pancreas. Bilateral adrenal lesions appear stable over prior studies, measuring 1.4 cm on the right and 3.5 cm on the left. Gallbladder is markedly distended and measures 17 cm in length. There is no CT evidence for  stones or pericholecystic fluid.  Gastrointestinal tract: The stomach and small bowel loops are normal in caliber. There is significant stranding surrounding the ascending colon which contains numerous diverticula. Colonic wall in this region is thickened and associated with numerous diverticula. No evidence for perforation. The appendix is well seen and has a normal appearance.  Pelvis: The uterus is surgically absent. No adnexal mass. Adjacent to the left aspect of the base the bladder there is a small fluid-filled structure of likely representing a small bladder diverticulum.  Retroperitoneum: No retroperitoneal or mesenteric adenopathy.  Abdominal wall: There is diffuse body wall edema. 5.2 x 1.3 cm lipoma identified in the right mid abdominal subcutaneous tissues.  Osseous structures: Scoliosis. No suspicious lytic or blastic lesions are identified.  IMPRESSION: 1. Marked distension of the gallbladder. Ultrasound may be helpful for further evaluation regarding the duct and possible gallstones. 2. Hepatic congestion with diffusely mottled appearance of the liver parenchyma. 3. Stable bilateral adrenal nodules. 4. Significant diverticulosis. Inflammation associated with the ascending colon favored to be related to diverticulitis. Malignancy should also be considered and followup is recommended. Colonoscopy and/or contrast enema may be helpful for further evaluation following clinical improvement. 5. Diffuse body wall edema. 6. Cardiomegaly.  Mitral annulus and coronary calcifications.   Electronically Signed   By: Rosalie Gums M.D.   On: 05/27/2014 13:39   US Abdomen Limited  05/27/2014  CLINICAL DATA:  Abdominal pain. Follow-up distended gallbladder on CT.  EXAM: US ABDOMEN LIMITED - RIGHT UPPER QUADRANT  COMPARISON:  CT of the abdomen and pelvis May 27, 2014 at 1303 hr  FINDINGS: Gallbladder:  Layering gallbladder sludge, tiny echogenic gallstones with acoustic shadowing. Gallbladder distention without  wall thickening or pericholecystic fluid. No sonographic Murphy's sign elicited.  Common bile duct:  Diameter: 5 mm  Liver:  No focal lesion identified. Within normal limits in parenchymal echogenicity. Hepatopetal portal vein.  IMPRESSION: Distended gallbladder with sludge/ cholelithiasis, no superimposed findings of acute cholecystitis.   Electronically Signed   By: Awilda Metroourtnay  Bloomer   On: 05/27/2014 15:01   Ir Perc Cholecystostomy  05/28/2014   CLINICAL DATA:  80 year old female with acute calculus cholecystitis resulting in secondary demand ischemia. She is currently not a surgical candidate. Percutaneous cholecystostomy tube is warranted.  EXAM: CHOLECYSTOSTOMY  Date: 05/28/2014  PROCEDURE: 1. Percutaneous transhepatic cholecystostomy tube with ultrasound and fluoroscopic guidance Interventional Radiologist:  Sterling BigHeath K. McCullough, MD  ANESTHESIA/SEDATION: Moderate (conscious) sedation was used. One mg Versed, 50 mcg Fentanyl were administered intravenously. The patient's vital signs were monitored continuously by radiology nursing throughout the procedure.  Sedation Time: 9 minutes  MEDICATIONS: 3.375 g Zosyn administered intravenously within 1 hr of skin incision  FLUOROSCOPY TIME:  1 min  12.9 mGy  CONTRAST:  10mL OMNIPAQUE IOHEXOL 300 MG/ML  SOLN  TECHNIQUE: Informed consent was obtained from the patient following explanation of the procedure, risks, benefits and alternatives. The patient understands, agrees and consents for the procedure. All questions were addressed. A time out was performed.  Maximal barrier sterile technique utilized including caps, mask, sterile gowns, sterile gloves, large sterile drape, hand hygiene, and Betadine skin prep.  The right upper quadrant was interrogated with ultrasound. A suitable skin entry site that would allow for a transhepatic course into the gallbladder lumen was selected and marked. Local anesthesia was attained by infiltration with 1% lidocaine. A small  dermatotomy was made. Under real-time sonographic guidance, a 21 gauge micropuncture needle was advanced through a short transhepatic course and into the gallbladder lumen. Removal of the stylet revealed free return of cloudy yellow bile. A gentle hand injection of contrast material confirmed opacification of the gallbladder lumen. The 0.018 inch wire was then coiled within the gallbladder lumen and the needle exchanged for the Accustick sheath.  The 0.018 inch wire was then exchanged for a short Amplatz wire. The tract was dilated to 10 [place] and a Cook 10.2 [place] all-purpose drainage catheter was advanced over the wire and formed within the gallbladder lumen. Approximately 120 mL of cloudy dark golden-brown bile was aspirated. A sample was sent for culture and sensitivities. The catheter was then flushed and connected to gravity bag drainage. The catheter was secured to the skin with 0 Prolene suture and a sterile bandage.  The patient tolerated the procedure well.  COMPLICATIONS: None  IMPRESSION: Successful placement of a 10 French transhepatic percutaneous cholecystostomy tube for acute calculus cholecystitis.  PLAN: 1. Maintain tube to gravity bag drainage. 2. May resume heparin in 2 hr. 3. Return to Interventional Radiology for percutaneous cholecystostomy tube check and change in 6- 8 weeks if interval cholecystectomy has not been performed. Signed,  Sterling BigHeath K. McCullough, MD  [facility]  [facility]   Electronically Signed   By: Malachy MoanHeath  McCullough M.D.   On: 05/28/2014 17:08   Dg Chest Port 1 View  05/27/2014   CLINICAL DATA:  Mid chest  and abdominal pain since Saturday. Intermittent cough.  EXAM: PORTABLE CHEST - 1 VIEW  COMPARISON:  01/20/2013.  FINDINGS: Trachea is midline. Heart size stable. The loop recorder projects over the left heart. Linear scarring or thickening of the minor fissure in the mid right hemi thorax. Probable scarring at both lung  bases. Lungs are otherwise clear. No pleural fluid. Right hemidiaphragm is rather elevated, as before. Calcifications in the right upper quadrant may represent gallstones.  IMPRESSION: No acute findings.   Electronically Signed   By: Leanna Battles M.D.   On: 05/27/2014 11:08    Assessment/Plan: Cholecystitis with cholelithiasis S/p perc chole drain Drain needs to remain minimum of 6-8 weeks unless requires surgical intervention. If not a surgical candidate, would need to decided if drain could be removed in 6-8 weeks if cholangiogram showed patent duct. Though with known stones, risks of recurrent cholecystitis much higher than if acalculous.     LOS: 2 days    Brayton El PA-C 05/29/2014 10:18 AM

## 2014-05-29 NOTE — Progress Notes (Signed)
Patient ID: [name], female   DOB: 09/28/1929, 80 y.o.   MRN: [id number] I met with her daughter, MS Stocking-Davis at the bedside and discussed her progress and plan of care. I suspect she will need SNF placement next week. Georganna Skeans, MD, MPH, FACS Trauma: [phone] General Surgery: [phone]

## 2014-05-29 NOTE — Progress Notes (Signed)
Subjective: sleepy  Objective: Vital signs in last 24 hours: Temp:  [97 F (36.1 C)-98.4 F (36.9 C)] 98.3 F (36.8 C) (12/29 2000) Pulse Rate:  [73-87] 87 (12/29 1705) Resp:  [12-28] 16 (12/30 0600) BP: (72-109)/(53-79) 84/60 mmHg (12/30 0600) SpO2:  [91 %-100 %] 100 % (12/30 0600) Last BM Date: 05/27/14  Intake/Output from previous day: 12/29 0701 - 12/30 0700 In: 2069 [P.O.:440; I.V.:1529; IV Piggyback:100] Out: 200 [Drains:200] Intake/Output this shift:    General appearance: no distress Resp: clear to auscultation bilaterally and RR28 Cardio: irregularly irregular rhythm and 110 GI: soft, NT, perc chole drain bilious  Lab Results:   Recent Labs  05/28/14 0028 05/29/14 0301  WBC 6.4 11.8*  HGB 9.9* 11.1*  HCT 31.7* 34.2*  PLT 205 151   BMET  Recent Labs  05/28/14 0028 05/29/14 0301  NA 140 138  K 3.5 3.6  CL 111 111  CO2 19 18*  GLUCOSE 110* 96  BUN 52* 43*  CREATININE 1.22* 1.11*  CALCIUM 7.8* 7.7*   PT/INR  Recent Labs  05/28/14 0950  LABPROT 17.6*  INR 1.43   ABG  Recent Labs  05/27/14 1122  PHART 7.315*  HCO3 17.4*    Studies/Results: Ct Abdomen Pelvis W Contrast  05/27/2014   CLINICAL DATA:  Abdominal pain. Nausea, vomiting. Decreased p.o. intake and shortness of breath.  EXAM: CT ABDOMEN AND PELVIS WITH CONTRAST  TECHNIQUE: Multidetector CT imaging of the abdomen and pelvis was performed using the standard protocol following bolus administration of intravenous contrast.  CONTRAST:  80mL OMNIPAQUE IOHEXOL 300 MG/ML  SOLN  COMPARISON:  CT of the abdomen and pelvis 01/20/2013  FINDINGS: Lower chest: There are fibrotic changes at the lung bases. Heart is markedly enlarged. Coronary artery and mitral annulus calcifications are present. No pericardial effusion.  Upper abdomen: There is a diffusely heterogeneous appearance of the liver without discrete lesions. Findings are consistent with hepatic congestion. No focal abnormality  identified within the spleen, pancreas. Bilateral adrenal lesions appear stable over prior studies, measuring 1.4 cm on the right and 3.5 cm on the left. Gallbladder is markedly distended and measures 17 cm in length. There is no CT evidence for stones or pericholecystic fluid.  Gastrointestinal tract: The stomach and small bowel loops are normal in caliber. There is significant stranding surrounding the ascending colon which contains numerous diverticula. Colonic wall in this region is thickened and associated with numerous diverticula. No evidence for perforation. The appendix is well seen and has a normal appearance.  Pelvis: The uterus is surgically absent. No adnexal mass. Adjacent to the left aspect of the base the bladder there is a small fluid-filled structure of likely representing a small bladder diverticulum.  Retroperitoneum: No retroperitoneal or mesenteric adenopathy.  Abdominal wall: There is diffuse body wall edema. 5.2 x 1.3 cm lipoma identified in the right mid abdominal subcutaneous tissues.  Osseous structures: Scoliosis. No suspicious lytic or blastic lesions are identified.  IMPRESSION: 1. Marked distension of the gallbladder. Ultrasound may be helpful for further evaluation regarding the duct and possible gallstones. 2. Hepatic congestion with diffusely mottled appearance of the liver parenchyma. 3. Stable bilateral adrenal nodules. 4. Significant diverticulosis. Inflammation associated with the ascending colon favored to be related to diverticulitis. Malignancy should also be considered and followup is recommended. Colonoscopy and/or contrast enema may be helpful for further evaluation following clinical improvement. 5. Diffuse body wall edema. 6. Cardiomegaly.  Mitral annulus and coronary calcifications.   Electronically Signed   By:  Beth  Brown M.D.   On: 05/27/2014 13:39   Us Abdomen Limited  05/27/2014   CLINICAL DATA:  Abdominal pain. Follow-up distended gallbladder on CT.  EXAM: US  ABDOMEN LIMITED - RIGHT UPPER QUADRANT  COMPARISON:  CT of the abdomen and pelvis May 27, 2014 at 1303 hr  FINDINGS: Gallbladder:  Layering gallbladder sludge, tiny echogenic gallstones with acoustic shadowing. Gallbladder distention without wall thickening or pericholecystic fluid. No sonographic Murphy's sign elicited.  Common bile duct:  Diameter: 5 mm  Liver:  No focal lesion identified. Within normal limits in parenchymal echogenicity. Hepatopetal portal vein.  IMPRESSION: Distended gallbladder with sludge/ cholelithiasis, no superimposed findings of acute cholecystitis.   Electronically Signed   By: Courtnay  Bloomer   On: 05/27/2014 15:01   Ir Perc Cholecystostomy  05/28/2014   CLINICAL DATA:  80 year old female with acute calculus cholecystitis resulting in secondary demand ischemia. She is currently not a surgical candidate. Percutaneous cholecystostomy tube is warranted.  EXAM: CHOLECYSTOSTOMY  Date: 05/28/2014  PROCEDURE: 1. Percutaneous transhepatic cholecystostomy tube with ultrasound and fluoroscopic guidance Interventional Radiologist:  Heath K. McCullough, MD  ANESTHESIA/SEDATION: Moderate (conscious) sedation was used. One mg Versed, 50 mcg Fentanyl were administered intravenously. The patient's vital signs were monitored continuously by radiology nursing throughout the procedure.  Sedation Time: 9 minutes  MEDICATIONS: 3.375 g Zosyn administered intravenously within 1 hr of skin incision  FLUOROSCOPY TIME:  1 min  12.9 [id number]  [id number]-[place] MEncompas24s He250-3Physicians 69Surg(2Bell MemoStaten I28slan(959) Musculoskeletal Ambulatory SWhite Coun2ty M9273Our L59ady 3Physicians B88ehav(6CurahealtD67e WiChildren'S Hospital & MTwin Rivers EndoscopLeone [id number] Db53a Au364S63elecRadia80nce (443)2[facility] HosRehob[facility] 25Chri(865)7[facility] [id number] BolingbSurgicenter Of [facility] MedicaLeone 35PDuke [facility] owing explanation of the procedure, risks, benefits and alternatives. The patient understands, agrees and consents for the procedure. All questions were addressed. A time out was performed.  Maximal barrier sterile technique utilized including caps, mask, sterile gowns, sterile gloves, large sterile drape, hand hygiene, and Betadine skin prep.  The right  upper quadrant was interrogated with ultrasound. A suitable skin entry site that would allow for a transhepatic course into the gallbladder lumen was selected and marked. Local anesthesia was attained by infiltration with 1% lidocaine. A small dermatotomy was made. Under real-time sonographic guidance, a 21 gauge micropuncture needle was advanced through a short transhepatic course and into the gallbladder lumen. Removal of the stylet revealed free return of cloudy yellow bile. A gentle hand injection of contrast material confirmed opacification of the gallbladder lumen. The 0.018 inch wire was then coiled within the gallbladder lumen and the needle exchanged for the Accustick sheath.  The 0.018 inch wire was then exchanged for a short Amplatz wire. The tract was dilated to 10 French and a Cook 10.2 French all-purpose drainage catheter was advanced over the wire and formed within the gallbladder lumen. Approximately 120 mL of cloudy dark golden-brown bile was aspirated. A sample was sent for culture and sensitivities. The catheter was then flushed and connected to gravity bag drainage. The catheter was secured to the skin with 0 Prolene suture and a sterile bandage.  The patient tolerated the procedure well.  COMPLICATIONS: None  IMPRESSION: Successful placement of a 10 French transhepatic percutaneous cholecystostomy tube for acute calculus cholecystitis.  PLAN: 1. Maintain tube to gravity bag drainage. 2. May resume heparin in 2 hr. 3. Return to Interventional Radiology for percutaneous cholecystostomy tube check and change in 6- 8 weeks if interval cholecystectomy has not been performed. Signed,  Heath K. McCullough, MD  [facility]  [facility]   Electronically Signed   By: Heath  McCullough M.D.   On:  05/28/2014 17:08   Dg Chest Port 1 View  05/27/2014   CLINICAL DATA:  Mid chest and abdominal pain since Saturday. Intermittent cough.  EXAM: PORTABLE CHEST - 1  VIEW  COMPARISON:  01/20/2013.  FINDINGS: Trachea is midline. Heart size stable. The loop recorder projects over the left heart. Linear scarring or thickening of the minor fissure in the mid right hemi thorax. Probable scarring at both lung bases. Lungs are otherwise clear. No pleural fluid. Right hemidiaphragm is rather elevated, as before. Calcifications in the right upper quadrant may represent gallstones.  IMPRESSION: No acute findings.   Electronically Signed   By: Leanna Battles M.D.   On: 05/27/2014 11:08    Anti-infectives: Anti-infectives    Start     Dose/Rate Route Frequency Ordered Stop   05/27/14 2200  piperacillin-tazobactam (ZOSYN) IVPB 3.375 g     3.375 g12.5 mL/hr over 240 Minutes Intravenous 3 times per day 05/27/14 1409     05/27/14 1415  piperacillin-tazobactam (ZOSYN) IVPB 3.375 g     3.375 g100 mL/hr over 30 Minutes Intravenous  Once 05/27/14 1409 05/27/14 1456      Assessment/Plan: Cholecystitis - S/P perc drain during heparin window.Appreciate Dr. Henri Medal help, continue zosyn, low fat diet as tolerated AF RVR - per Dr. Sharyn Lull  LOS: 2 days    Violeta Gelinas E 05/29/2014

## 2014-05-29 NOTE — Progress Notes (Signed)
Brief Nutrition Follow-Up Note  Refer to full assessment on 05/28/14 for further details. Noted pt has been advanced to a Heart Heart Healthy diet. Noted orders to send soft foods, due to lack of dentures. Intake of diet is poor; 25% meal completion. Will order Resource Breeze po BID, each supplement provides 250 kcal and 9 grams of protein, due to diet advancement.   Will continue to follow closely.   Anaiyah Anglemyer A. Mayford Knife, RD, LDN Pager: [phone] After hours Pager: [phone]

## 2014-05-29 NOTE — Progress Notes (Signed)
Subjective:  Complains of soreness and drain site overall feels better denies any chest pain or shortness of breath. Remains in atrial fibrillation with moderate ventricle response of Cardizem due to low blood pressure  Objective:  Vital Signs in the last 24 hours: Temp:  [97.2 F (36.2 C)-98.3 F (36.8 C)] 97.6 F (36.4 C) (12/30 1700) Pulse Rate:  [106-110] 110 (12/30 1700) Resp:  [16-27] 26 (12/30 1700) BP: (79-119)/(47-83) 119/63 mmHg (12/30 1700) SpO2:  [93 %-100 %] 97 % (12/30 1600)  Intake/Output from previous day: 12/29 0701 - 12/30 0700 In: 2127 [P.O.:440; I.V.:1587; IV Piggyback:100] Out: 200 [Drains:200] Intake/Output from this shift: Total I/O In: 790 [P.O.:360; I.V.:380; IV Piggyback:50] Out: 50 [Drains:50]  Physical Exam: Neck: no adenopathy, no carotid bruit, no JVD and supple, symmetrical, trachea midline Lungs: clear to auscultation bilaterally Heart: irregularly irregular rhythm, S1, S2 normal and Soft systolic murmur noted Abdomen: Mild generalized abdominal tenderness noted surgical dressing dry  Extremities: extremities normal, atraumatic, no cyanosis or edema  Lab Results:  Recent Labs  05/28/14 0028 05/29/14 0301  WBC 6.4 11.8*  HGB 9.9* 11.1*  PLT 205 151    Recent Labs  05/28/14 0028 05/29/14 0301  NA 140 138  K 3.5 3.6  CL 111 111  CO2 19 18*  GLUCOSE 110* 96  BUN 52* 43*  CREATININE 1.22* 1.11*    Recent Labs  05/28/14 0028 05/28/14 0655  TROPONINI 1.08* 0.86*   Hepatic Function Panel  Recent Labs  05/29/14 0301  PROT 6.3  ALBUMIN 2.6*  AST 125*  ALT 269*  ALKPHOS 87  BILITOT 1.2    Recent Labs  05/28/14 0305  CHOL 119   No results for input(s): PROTIME in the last 72 hours.  Imaging: Imaging results have been reviewed and Ir Perc Cholecystostomy  05/28/2014   CLINICAL DATA:  80 year old female with acute calculus cholecystitis resulting in secondary demand ischemia. She is currently not a surgical  candidate. Percutaneous cholecystostomy tube is warranted.  EXAM: CHOLECYSTOSTOMY  Date: 05/28/2014  PROCEDURE: 1. Percutaneous transhepatic cholecystostomy tube with ultrasound and fluoroscopic guidance Interventional Radiologist:  Sterling BigHeath K. McCullough, MD  ANESTHESIA/SEDATION: Moderate (conscious) sedation was used. One mg Versed, 50 mcg Fentanyl were administered intravenously. The patient's vital signs were monitored continuously by radiology nursing throughout the procedure.  Sedation Time: 9 minutes  MEDICATIONS: 3.375 g Zosyn administered intravenously within 1 hr of skin incision  FLUOROSCOPY TIME:  1 min  12.9 mGy  CONTRAST:  10mL OMNIPAQUE IOHEXOL 300 MG/ML  SOLN  TECHNIQUE: Informed consent was obtained from the patient following explanation of the procedure, risks, benefits and alternatives. The patient understands, agrees and consents for the procedure. All questions were addressed. A time out was performed.  Maximal barrier sterile technique utilized including caps, mask, sterile gowns, sterile gloves, large sterile drape, hand hygiene, and Betadine skin prep.  The right upper quadrant was interrogated with ultrasound. A suitable skin entry site that would allow for a transhepatic course into the gallbladder lumen was selected and marked. Local anesthesia was attained by infiltration with 1% lidocaine. A small dermatotomy was made. Under real-time sonographic guidance, a 21 gauge micropuncture needle was advanced through a short transhepatic course and into the gallbladder lumen. Removal of the stylet revealed free return of cloudy yellow bile. A gentle hand injection of contrast material confirmed opacification of the gallbladder lumen. The 0.018 inch wire was then coiled within the gallbladder lumen and the needle exchanged for the Accustick sheath.  The 0.018  inch wire was then exchanged for a short Amplatz wire. The tract was dilated to 10 [place] and a Cook 10.2 [place] all-purpose drainage catheter  was advanced over the wire and formed within the gallbladder lumen. Approximately 120 mL of cloudy dark golden-brown bile was aspirated. A sample was sent for culture and sensitivities. The catheter was then flushed and connected to gravity bag drainage. The catheter was secured to the skin with 0 Prolene suture and a sterile bandage.  The patient tolerated the procedure well.  COMPLICATIONS: None  IMPRESSION: Successful placement of a 10 French transhepatic percutaneous cholecystostomy tube for acute calculus cholecystitis.  PLAN: 1. Maintain tube to gravity bag drainage. 2. May resume heparin in 2 hr. 3. Return to Interventional Radiology for percutaneous cholecystostomy tube check and change in 6- 8 weeks if interval cholecystectomy has not been performed. Signed,  Sterling Big, MD  [facility]  [facility]   Electronically Signed   By: Malachy Moan M.D.   On: 05/28/2014 17:08    Cardiac Studies:  Assessment/Plan:  Cholecystitis/cholelithiasis status post percutaneous cholecystostomy New-onset A. fib with controlled ventricular response Compensated CHF secondary to preserved LV systolic function History of non-Q-wave MI in the past secondary to demand ischemia Mildly elevated troponin I secondary to demand ischemia doubt significant MI as patient has similar presentation in the past and noted to have mild CAD. History of recurrent syncope in the past status post loop recorder hypertension Hypercholesteremia Osteoarthritis Anemia GERD Dementia  history of questionable seizure disorder Plan Restart low-dose Cardizem as per orders  LOS: 2 days    [name] N 05/29/2014, 6:10 PM

## 2014-05-29 NOTE — Progress Notes (Signed)
ANTICOAGULATION CONSULT NOTE Pharmacy Consult for Heparin  Indication: atrial fibrillation  Allergies  Allergen Reactions  . Codeine Other (See Comments)    UNKNOWN; Per MAR    Patient Measurements: Height: 5\' 4"  (162.6 cm) Weight: 99 lb 3.3 oz (45 kg) IBW/kg (Calculated) : 54.7  Vital Signs: Temp: 98.3 F (36.8 C) (12/29 2000) Temp Source: Oral (12/29 2000) BP: 95/63 mmHg (12/29 2300) Pulse Rate: 87 (12/29 1705)  Labs:  Recent Labs  05/27/14 1012  05/27/14 2019 05/28/14 0028 05/28/14 0305 05/28/14 0655 05/28/14 0950 05/28/14 2242  HGB 12.8  --  10.2* 9.9*  --   --   --   --   HCT 38.7  --  30.9* 31.7*  --   --   --   --   PLT 195  --  159 205  --   --   --   --   APTT  --   --   --   --   --   --  33  --   LABPROT  --   --   --   --   --   --  17.6*  --   INR  --   --   --   --   --   --  1.43  --   HEPARINUNFRC  --   --   --   --  0.10*  --   --  0.32  CREATININE 1.14*  --  1.18* 1.22*  --   --   --   --   TROPONINI  --   < > 1.19* 1.08*  --  0.86*  --   --   < > = values in this interval not displayed.  Assessment: 80 yo female with Afib, s/p percutaneous cholecystostomy tube placement 12/29, for heparin  Goal of Therapy:  Heparin level 0.3-0.7 units/ml Monitor platelets by anticoagulation protocol: Yes   Plan:  Continue Heparin at current rate  Follow-up am labs.  Eddie Candle 05/29/2014,12:04 AM

## 2014-05-30 DIAGNOSIS — K8000 Calculus of gallbladder with acute cholecystitis without obstruction: Secondary | ICD-10-CM | POA: Diagnosis not present

## 2014-05-30 LAB — CBC
HEMATOCRIT: 33 % — AB (ref 36.0–46.0)
HEMOGLOBIN: 10.7 g/dL — AB (ref 12.0–15.0)
MCH: 23 pg — ABNORMAL LOW (ref 26.0–34.0)
MCHC: 32.4 g/dL (ref 30.0–36.0)
MCV: 71 fL — AB (ref 78.0–100.0)
Platelets: 148 10*3/uL — ABNORMAL LOW (ref 150–400)
RBC: 4.65 MIL/uL (ref 3.87–5.11)
RDW: 22.7 % — ABNORMAL HIGH (ref 11.5–15.5)
WBC: 10.4 10*3/uL (ref 4.0–10.5)

## 2014-05-30 LAB — HEPARIN LEVEL (UNFRACTIONATED): Heparin Unfractionated: 0.28 IU/mL — ABNORMAL LOW (ref 0.30–0.70)

## 2014-05-30 MED ORDER — WARFARIN - PHARMACIST DOSING INPATIENT
Freq: Every day | Status: DC
  Administered 2014-05-30 – 2014-06-02 (×4)

## 2014-05-30 MED ORDER — WARFARIN SODIUM 5 MG PO TABS
5.0000 mg | ORAL_TABLET | Freq: Once | ORAL | Status: AC
  Administered 2014-05-30: 5 mg via ORAL
  Filled 2014-05-30: qty 1

## 2014-05-30 MED ORDER — SENNOSIDES-DOCUSATE SODIUM 8.6-50 MG PO TABS
1.0000 | ORAL_TABLET | Freq: Two times a day (BID) | ORAL | Status: DC | PRN
  Filled 2014-05-30 (×2): qty 1

## 2014-05-30 MED ORDER — HEPARIN (PORCINE) IN NACL 100-0.45 UNIT/ML-% IJ SOLN
950.0000 [IU]/h | INTRAMUSCULAR | Status: DC
  Administered 2014-05-30 – 2014-05-31 (×3): 900 [IU]/h via INTRAVENOUS
  Filled 2014-05-30 (×6): qty 250

## 2014-05-30 MED ORDER — WARFARIN VIDEO
Freq: Once | Status: AC
  Administered 2014-05-30: 16:00:00

## 2014-05-30 MED ORDER — COUMADIN BOOK
Freq: Once | Status: AC
  Administered 2014-05-30: 16:00:00
  Filled 2014-05-30: qty 1

## 2014-05-30 MED ORDER — MORPHINE SULFATE 0.5 MG/ML IJ SOLN: 1.0000 mg | Freq: Four times a day (QID) | INTRAMUSCULAR | Status: DC | PRN

## 2014-05-30 MED ORDER — MORPHINE SULFATE 2 MG/ML IJ SOLN
1.0000 mg | Freq: Four times a day (QID) | INTRAMUSCULAR | Status: DC | PRN
  Administered 2014-05-30 – 2014-06-04 (×9): 1 mg via INTRAVENOUS
  Filled 2014-05-30 (×9): qty 1

## 2014-05-30 NOTE — Progress Notes (Addendum)
ANTICOAGULATION CONSULT NOTE - Follow Up Consult ANTIBIOTIC CONSULT NOTE - Follow up Consult  Pharmacy Consult for Heparin and Zosyn; add Coumadin Indication: atrial fibrillation and intra-abdominal infection  Allergies  Allergen Reactions  . Codeine Other (See Comments)    UNKNOWN; Per MAR    Patient Measurements: Height: 5\' 4"  (162.6 cm) Weight: 99 lb 3.3 oz (45 kg) IBW/kg (Calculated) : 54.7 Heparin Dosing Weight: 45kg  Vital Signs: Temp: 97.5 F (36.4 C) (12/31 0716) Temp Source: Oral (12/31 0716) BP: 101/50 mmHg (12/31 0800) Pulse Rate: 95 (12/31 0400)  Labs:  Recent Labs  05/27/14 2019 05/28/14 0028  05/28/14 0655 05/28/14 0950 05/28/14 2242 05/29/14 0301 05/30/14 0258  HGB 10.2* 9.9*  --   --   --   --  11.1* 10.7*  HCT 30.9* 31.7*  --   --   --   --  34.2* 33.0*  PLT 159 205  --   --   --   --  151 148*  APTT  --   --   --   --  33  --   --   --   LABPROT  --   --   --   --  17.6*  --   --   --   INR  --   --   --   --  1.43  --   --   --   HEPARINUNFRC  --   --   < >  --   --  0.32 0.33 0.28*  CREATININE 1.18* 1.22*  --   --   --   --  1.11*  --   TROPONINI 1.19* 1.08*  --  0.86*  --   --   --   --   < > = values in this interval not displayed.  Estimated Creatinine Clearance: 27.8 mL/min (by C-G formula based on Cr of 1.11).   Medications:  Heparin @ 800 units/hr  Assessment: 84yof continues on heparin for afib. Heparin level is slightly below goal at 0.28. CBC stable. No bleeding reported.  She also continues on day # 4 zosyn for diverticulitis/cholecystitis, s/p perc drain on 12/29. Renal function has remained stable.  12/28 Zosyn>> 12/29 bile fluid cx>> few GNR  Goal of Therapy:  Heparin level 0.3-0.7 units/ml Monitor platelets by anticoagulation protocol: Yes   Plan:  1) Increase heparin to 900 units/hr 2) Follow up heparin level and CBC in AM 3) Continue zosyn 3.375g IV q8 (4 hour infusion) 4) Continue to follow renal function,  culture, LOT  Fredrik Rigger 05/30/2014,10:18 AM  Addendum: Patient to now begin coumadin for her afib. CHADSVASC = 6. Baseline INR 1.43. Coumadin score = 3.  Plan: 1) Coumadin 5mg  x 1 2) Daily INR 3) Coumadin education - book/video  Fredrik Rigger 05/30/2014, 2:32 PM

## 2014-05-30 NOTE — Progress Notes (Signed)
Subjective:  Complains of vague abdominal pain at the catheter site. Denies fever chills. Remains in atrial fibrillation with moderate ventricular response. Has not had BM for last few days  Objective:  Vital Signs in the last 24 hours: Temp:  [97.3 F (36.3 C)-97.6 F (36.4 C)] 97.3 F (36.3 C) (12/31 1127) Pulse Rate:  [95-110] 95 (12/31 0400) Resp:  [14-32] 28 (12/31 1200) BP: (79-121)/(44-87) 121/64 mmHg (12/31 1200) SpO2:  [95 %-100 %] 97 % (12/31 1200)  Intake/Output from previous day: 12/30 0701 - 12/31 0700 In: 1346 [P.O.:720; I.V.:476; IV Piggyback:150] Out: 60 [Drains:60] Intake/Output from this shift: Total I/O In: 413.6 [P.O.:250; I.V.:163.6] Out: -   Physical Exam: Neck: no adenopathy, no carotid bruit, no JVD and supple, symmetrical, trachea midline Lungs: clear to auscultation bilaterally Heart: irregularly irregular rhythm, S1, S2 normal and Soft systolic murmur noted Abdomen: Soft mild generalized tenderness catheter site dry Extremities: extremities normal, atraumatic, no cyanosis or edema  Lab Results:  Recent Labs  05/29/14 0301 05/30/14 0258  WBC 11.8* 10.4  HGB 11.1* 10.7*  PLT 151 148*    Recent Labs  05/28/14 0028 05/29/14 0301  NA 140 138  K 3.5 3.6  CL 111 111  CO2 19 18*  GLUCOSE 110* 96  BUN 52* 43*  CREATININE 1.22* 1.11*    Recent Labs  05/28/14 0028 05/28/14 0655  TROPONINI 1.08* 0.86*   Hepatic Function Panel  Recent Labs  05/29/14 0301  PROT 6.3  ALBUMIN 2.6*  AST 125*  ALT 269*  ALKPHOS 87  BILITOT 1.2    Recent Labs  05/28/14 0305  CHOL 119   No results for input(s): PROTIME in the last 72 hours.  Imaging: Imaging results have been reviewed and No results found.  Cardiac Studies:  Assessment/Plan:  Cholecystitis/cholelithiasis status post percutaneous cholecystostomy New-onset A. fib with controlled ventricular response Compensated CHF secondary to preserved LV systolic function History of  non-Q-wave MI in the past secondary to demand ischemia Mildly elevated troponin I secondary to demand ischemia doubt significant MI as patient has similar presentation in the past and noted to have mild CAD. History of recurrent syncope in the past status post loop recorder hypertension Hypercholesteremia Osteoarthritis Anemia GERD Dementia  history of questionable seizure disorder Constipation Plan Rx for constipation Out of bed to chair OT PT consult Start Coumadin per pharmacy  LOS: 3 days    Arlon Bleier N 05/30/2014, 1:59 PM

## 2014-05-30 NOTE — Progress Notes (Signed)
CSW order placed today to assist with SNF placement.  Awaiting PT/OT Evaluations to determine if SNF is indicated. Will require for insurance auth as well.  CSW will monitor and assist with placement if indicated and follow up with patient and family tomorrow. Lorri Frederick. Jaci Lazier, [place] [phone]

## 2014-05-30 NOTE — Progress Notes (Signed)
Patient ID: [name], female   DOB: [birth year], 80 y.o.   MRN: [id number]    Subjective: Feels "a little bit better."  Objective: Vital signs in last 24 hours: Temp:  [97.2 F (36.2 C)-97.6 F (36.4 C)] 97.5 F (36.4 C) (12/31 0716) Pulse Rate:  [95-110] 95 (12/31 0400) Resp:  [20-32] 26 (12/31 0716) BP: (79-119)/(44-87) 93/57 mmHg (12/31 0716) SpO2:  [95 %-100 %] 98 % (12/31 0716) Last BM Date: 05/27/14  Intake/Output from previous day: 12/30 0701 - 12/31 0700 In: 1346 [P.O.:720; I.V.:476; IV Piggyback:150] Out: 60 [Drains:60] Intake/Output this shift:    General appearance: no distress Resp: breathing comfortably Cardio: irregularly irregular rhythm and 110 GI: soft, sl tender at tube site, perc chole drain bilious, murky.  Abd sl distended.    Lab Results:   Recent Labs  05/29/14 0301 05/30/14 0258  WBC 11.8* 10.4  HGB 11.1* 10.7*  HCT 34.2* 33.0*  PLT 151 148*   BMET  Recent Labs  05/28/14 0028 05/29/14 0301  NA 140 138  K 3.5 3.6  CL 111 111  CO2 19 18*  GLUCOSE 110* 96  BUN 52* 43*  CREATININE 1.22* 1.11*  CALCIUM 7.8* 7.7*   PT/INR  Recent Labs  05/28/14 0950  LABPROT 17.6*  INR 1.43   ABG  Recent Labs  05/27/14 1122  PHART 7.315*  HCO3 17.4*    Studies/Results: Ir Perc Cholecystostomy  05/28/2014   CLINICAL DATA:  80 year old female with acute calculus cholecystitis resulting in secondary demand ischemia. She is currently not a surgical candidate. Percutaneous cholecystostomy tube is warranted.  EXAM: CHOLECYSTOSTOMY  Date: 05/28/2014  PROCEDURE: 1. Percutaneous transhepatic cholecystostomy tube with ultrasound and fluoroscopic guidance Interventional Radiologist:  Sterling Big, MD  ANESTHESIA/SEDATION: Moderate (conscious) sedation was used. One mg Versed, 50 mcg Fentanyl were administered intravenously. The patient's vital signs were monitored continuously by radiology nursing throughout the procedure.  Sedation Time: 9  minutes  MEDICATIONS: 3.375 g Zosyn administered intravenously within 1 hr of skin incision  FLUOROSCOPY TIME:  1 min  12.9 mGy  CONTRAST:  33mL OMNIPAQUE IOHEXOL 300 MG/ML  SOLN  TECHNIQUE: Informed consent was obtained from the patient following explanation of the procedure, risks, benefits and alternatives. The patient understands, agrees and consents for the procedure. All questions were addressed. A time out was performed.  Maximal barrier sterile technique utilized including caps, mask, sterile gowns, sterile gloves, large sterile drape, hand hygiene, and Betadine skin prep.  The right upper quadrant was interrogated with ultrasound. A suitable skin entry site that would allow for a transhepatic course into the gallbladder lumen was selected and marked. Local anesthesia was attained by infiltration with 1% lidocaine. A small dermatotomy was made. Under real-time sonographic guidance, a 21 gauge micropuncture needle was advanced through a short transhepatic course and into the gallbladder lumen. Removal of the stylet revealed free return of cloudy yellow bile. A gentle hand injection of contrast material confirmed opacification of the gallbladder lumen. The 0.018 inch wire was then coiled within the gallbladder lumen and the needle exchanged for the Accustick sheath.  The 0.018 inch wire was then exchanged for a short Amplatz wire. The tract was dilated to 10 [place] and a Cook 10.2 [place] all-purpose drainage catheter was advanced over the wire and formed within the gallbladder lumen. Approximately 120 mL of cloudy dark golden-brown bile was aspirated. A sample was sent for culture and sensitivities. The catheter was then flushed and connected to gravity bag drainage. The  catheter was secured to the skin with 0 Prolene suture and a sterile bandage.  The patient tolerated the procedure well.  COMPLICATIONS: None  IMPRESSION: Successful placement of a 10 French transhepatic percutaneous cholecystostomy tube for  acute calculus cholecystitis.  PLAN: 1. Maintain tube to gravity bag drainage. 2. May resume heparin in 2 hr. 3. Return to Interventional Radiology for percutaneous cholecystostomy tube check and change in 6- 8 weeks if interval cholecystectomy has not been performed. Signed,  Sterling BigHeath K. McCullough, MD  [facility]  [facility]   Electronically Signed   By: Malachy MoanHeath  McCullough M.D.   On: 05/28/2014 17:08    Anti-infectives: Anti-infectives    Start     Dose/Rate Route Frequency Ordered Stop   05/27/14 2200  piperacillin-tazobactam (ZOSYN) IVPB 3.375 g     3.375 g12.5 mL/hr over 240 Minutes Intravenous 3 times per day 05/27/14 1409     05/27/14 1415  piperacillin-tazobactam (ZOSYN) IVPB 3.375 g     3.375 g100 mL/hr over 30 Minutes Intravenous  Once 05/27/14 1409 05/27/14 1456      Assessment/Plan: Cholecystitis - S/P perc drain during heparin window. Continue zosyn, perc drain.  Low fat diet as tolerated.   AF RVR - per Dr. Sharyn LullHarwani   LOS: 3 days    [facility] BehavioralBYERLY,Liesa Tsan 05/30/2014

## 2014-05-31 LAB — CBC
HEMATOCRIT: 34.5 % — AB (ref 36.0–46.0)
Hemoglobin: 11.3 g/dL — ABNORMAL LOW (ref 12.0–15.0)
MCH: 22.7 pg — AB (ref 26.0–34.0)
MCHC: 32.8 g/dL (ref 30.0–36.0)
MCV: 69.4 fL — AB (ref 78.0–100.0)
Platelets: 156 10*3/uL (ref 150–400)
RBC: 4.97 MIL/uL (ref 3.87–5.11)
RDW: 22.5 % — ABNORMAL HIGH (ref 11.5–15.5)
WBC: 11.7 10*3/uL — ABNORMAL HIGH (ref 4.0–10.5)

## 2014-05-31 LAB — PROTIME-INR
INR: 1.2 (ref 0.00–1.49)
PROTHROMBIN TIME: 15.3 s — AB (ref 11.6–15.2)

## 2014-05-31 LAB — HEPARIN LEVEL (UNFRACTIONATED): HEPARIN UNFRACTIONATED: 0.37 [IU]/mL (ref 0.30–0.70)

## 2014-05-31 MED ORDER — WARFARIN SODIUM 5 MG PO TABS
5.0000 mg | ORAL_TABLET | Freq: Once | ORAL | Status: AC
  Administered 2014-05-31: 5 mg via ORAL
  Filled 2014-05-31: qty 1

## 2014-05-31 MED ORDER — GLYCERIN (LAXATIVE) 2.1 G RE SUPP
1.0000 | Freq: Every day | RECTAL | Status: DC | PRN
  Administered 2014-06-02: 1 via RECTAL
  Filled 2014-05-31 (×2): qty 1

## 2014-05-31 NOTE — Progress Notes (Signed)
Subjective:  Denies any chest pain or shortness of breath. Complains of mild abdominal pain at catheter site.  Objective:  Vital Signs in the last 24 hours: Temp:  [97.3 F (36.3 C)-97.7 F (36.5 C)] 97.7 F (36.5 C) (01/01 0715) Pulse Rate:  [74-97] 88 (01/01 0715) Resp:  [14-30] 22 (01/01 0800) BP: (97-132)/(49-80) 98/65 mmHg (01/01 0800) SpO2:  [92 %-100 %] 100 % (01/01 0800)  Intake/Output from previous day: 12/31 0701 - 01/01 0700 In: 934.4 [P.O.:400; I.V.:384.4; IV Piggyback:150] Out: 75 [Drains:75] Intake/Output from this shift: Total I/O In: 9 [I.V.:9] Out: -   Physical Exam: Neck: no adenopathy, no carotid bruit, no JVD and supple, symmetrical, trachea midline Lungs: clear to auscultation bilaterally Heart: irregularly irregular rhythm, S1, S2 normal and Soft systolic murmur noted Abdomen: Soft bowel sounds present catheter site dry Extremities: extremities normal, atraumatic, no cyanosis or edema  Lab Results:  Recent Labs  05/30/14 0258 05/31/14 0315  WBC 10.4 11.7*  HGB 10.7* 11.3*  PLT 148* 156    Recent Labs  05/29/14 0301  NA 138  K 3.6  CL 111  CO2 18*  GLUCOSE 96  BUN 43*  CREATININE 1.11*   No results for input(s): TROPONINI in the last 72 hours.  Invalid input(s): CK, MB Hepatic Function Panel  Recent Labs  05/29/14 0301  PROT 6.3  ALBUMIN 2.6*  AST 125*  ALT 269*  ALKPHOS 87  BILITOT 1.2   No results for input(s): CHOL in the last 72 hours. No results for input(s): PROTIME in the last 72 hours.  Imaging: Imaging results have been reviewed and No results found.  Cardiac Studies:  Assessment/Plan:  Cholecystitis/cholelithiasis status post percutaneous cholecystostomy New-onset A. fib with controlled ventricular response Compensated CHF secondary to preserved LV systolic function History of non-Q-wave MI in the past secondary to demand ischemia Mildly elevated troponin I secondary to demand ischemia doubt significant MI  as patient has similar presentation in the past and noted to have mild CAD. History of recurrent syncope in the past status post loop recorder hypertension Hypercholesteremia Osteoarthritis Anemia GERD Dementia  history of questionable seizure disorder Constipation Plan Continue present management Increase ambulation Rx for constipation  LOS: 4 days    [name] N 05/31/2014, 8:20 AM

## 2014-05-31 NOTE — Progress Notes (Signed)
  Subjective: Weak  Some abdominal pain at drain site  Objective: Vital signs in last 24 hours: Temp:  [97.3 F (36.3 C)-97.7 F (36.5 C)] 97.7 F (36.5 C) (01/01 0715) Pulse Rate:  [74-97] 88 (01/01 0715) Resp:  [14-30] 21 (01/01 0715) BP: (97-132)/(49-80) 105/74 mmHg (01/01 0715) SpO2:  [92 %-100 %] 99 % (01/01 0715) Last BM Date: 05/27/14  Intake/Output from previous day: 12/31 0701 - 01/01 0700 In: 884.4 [P.O.:400; I.V.:384.4; IV Piggyback:100] Out: 75 [Drains:75] Intake/Output this shift:    GI: tender BUQ  ND dain site clean   bile in bag  Lab Results:   Recent Labs  05/30/14 0258 05/31/14 0315  WBC 10.4 11.7*  HGB 10.7* 11.3*  HCT 33.0* 34.5*  PLT 148* 156   BMET  Recent Labs  05/29/14 0301  NA 138  K 3.6  CL 111  CO2 18*  GLUCOSE 96  BUN 43*  CREATININE 1.11*  CALCIUM 7.7*   PT/INR  Recent Labs  05/28/14 0950 05/31/14 0315  LABPROT 17.6* 15.3*  INR 1.43 1.20   ABG No results for input(s): PHART, HCO3 in the last 72 hours.  Invalid input(s): PCO2, PO2  Studies/Results: No results found.  Anti-infectives: Anti-infectives    Start     Dose/Rate Route Frequency Ordered Stop   05/27/14 2200  piperacillin-tazobactam (ZOSYN) IVPB 3.375 g     3.375 g12.5 mL/hr over 240 Minutes Intravenous 3 times per day 05/27/14 1409     05/27/14 1415  piperacillin-tazobactam (ZOSYN) IVPB 3.375 g     3.375 g100 mL/hr over 30 Minutes Intravenous  Once 05/27/14 1409 05/27/14 1456      Assessment/Plan: Patient Active Problem List   Diagnosis Date Noted  . Protein-calorie malnutrition, severe 05/28/2014  . Abdominal pain   . Cholecystitis 05/27/2014  . Atrial fibrillation with rapid ventricular response 05/27/2014  . Syncope 09/11/2013  . Other primary cardiomyopathies   . 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 Hypoxia 11/25/2011  . CAP (community acquired pneumonia) 11/25/2011  . Elevated troponin 11/25/2011  . Implantable loop recorder    . DYSPNEA 01/27/2010  . RBBB 11/26/2008  . SYNCOPE 11/26/2008  . DEMENTIA 11/23/2008  . HYPERTENSION 11/23/2008  . CVA 11/23/2008  . SEIZURE DISORDER 11/23/2008   S/p percutaneous drainage    Tube functioning  Follow for now  Continue ABX   LOS: 4 days    Keyonta Barradas A. 05/31/2014

## 2014-05-31 NOTE — Progress Notes (Signed)
Subjective: Pt still fairly weak Some soreness at drain site  Objective: Physical Exam: BP 97/64 mmHg  Pulse 88  Temp(Src) 97.7 F (36.5 C) (Oral)  Resp 21  Ht 5\' 4"  (1.626 m)  Wt 99 lb 3.3 oz (45 kg)  BMI 17.02 kg/m2  SpO2 100% RUQ perc chole drain intact. Site clean, NT Bilious output    Labs: CBC  Recent Labs  05/30/14 0258 05/31/14 0315  WBC 10.4 11.7*  HGB 10.7* 11.3*  HCT 33.0* 34.5*  PLT 148* 156   BMET  Recent Labs  05/29/14 0301  NA 138  K 3.6  CL 111  CO2 18*  GLUCOSE 96  BUN 43*  CREATININE 1.11*  CALCIUM 7.7*   LFT  Recent Labs  05/29/14 0301  PROT 6.3  ALBUMIN 2.6*  AST 125*  ALT 269*  ALKPHOS 87  BILITOT 1.2   PT/INR  Recent Labs  05/31/14 0315  LABPROT 15.3*  INR 1.20     Studies/Results: No results found.  Assessment/Plan: Cholecystitis with cholelithiasis S/p perc chole drain Drain needs to remain minimum of 6-8 weeks unless requires surgical intervention. If not a surgical candidate, would need to decided if drain could be removed in 6-8 weeks if cholangiogram showed patent duct. Though with known stones, risks of recurrent cholecystitis much higher than if acalculous. Call IR if drain issues    LOS: 4 days    Brayton El PA-C 05/31/2014 10:27 AM

## 2014-05-31 NOTE — Progress Notes (Addendum)
Pt experienced ~14 Beat run of VT. RN to patient's room, pt was alert, denied CP, SOB, dizziness. Pt returned to A-fib with a HR between 90-110. RN will continue to monitor and assess pt.

## 2014-05-31 NOTE — Progress Notes (Signed)
ANTICOAGULATION CONSULT NOTE - Follow Up Consult  Pharmacy Consult for Heparin and Coumadin Indication: atrial fibrillation  Allergies  Allergen Reactions  . Codeine Other (See Comments)    UNKNOWN; Per MAR    Patient Measurements: Height: 5\' 4"  (162.6 cm) Weight: 99 lb 3.3 oz (45 kg) IBW/kg (Calculated) : 54.7 Heparin Dosing Weight: 45kg  Vital Signs: Temp: 97.7 F (36.5 C) (01/01 0715) Temp Source: Oral (01/01 0715) BP: 105/74 mmHg (01/01 0715) Pulse Rate: 88 (01/01 0715)  Labs:  Recent Labs  05/28/14 0950  05/29/14 0301 05/30/14 0258 05/31/14 0315  HGB  --   < > 11.1* 10.7* 11.3*  HCT  --   --  34.2* 33.0* 34.5*  PLT  --   --  151 148* 156  APTT 33  --   --   --   --   LABPROT 17.6*  --   --   --  15.3*  INR 1.43  --   --   --  1.20  HEPARINUNFRC  --   < > 0.33 0.28* 0.37  CREATININE  --   --  1.11*  --   --   < > = values in this interval not displayed.  Estimated Creatinine Clearance: 27.8 mL/min (by C-G formula based on Cr of 1.11).  Medications:  Heparin @ 900 units/hr  Assessment: 84yof continues on heparin for afib. Heparin level is at goal at 0.37. CBC stable. No bleeding reported.  INR 1.2 today, has had one dose of Coumadin last night.  CHADSVASC = 6. Baseline INR 1.43. Coumadin score = 3  Goal of Therapy:  Heparin level 0.3-0.7 units/ml Monitor platelets by anticoagulation protocol: Yes   Plan:  1) Continue IV heparin at 900 units/hr. 2) Coumadin 5 mg po x 1 tonight.  Will need Coumadin education prior to discharge. 3) Follow up heparin level, CBC, and INR in AM  Reece Leader, Loura Back, BCPS  Clinical Pharmacist Pager [phone]  05/31/2014 8:06 AM

## 2014-05-31 NOTE — Evaluation (Signed)
Physical Therapy Evaluation Patient Details Name: [name] MRN: [id number] DOB: [birth year] Today's Date: 05/31/2014   History of Present Illness  Pt is an 81 y/o female admitted with Cholecystitis s/p percutaneous drain. Pt came to the ER by family complaining of vague abdominal pain associated with poor appetite diarrhea and vomiting and feeling generalized weakness for last 1 week PTA. Patient was noted to be in A. fib with RVR with blood pressure in 90s. CT of the abdomen done showed marked distention of gallbladder with possible ductal obstruction and gallstones and questionable diverticulitis.   Clinical Impression  Pt admitted with above diagnosis. Pt currently with functional limitations due to the deficits listed below (see PT Problem List). At the time of PT eval pt was able to perform transfers with +2 assist. Family present in room and they and patient are agreeable to SNF at d/c to improve functional mobility prior to return home. Pt will benefit from skilled PT to increase their independence and safety with mobility to allow discharge to the venue listed below.      Follow Up Recommendations SNF;Supervision/Assistance - 24 hour    Equipment Recommendations  Rolling walker with 5" wheels    Recommendations for Other Services       Precautions / Restrictions Precautions Precautions: Fall Precaution Comments: Drain present on R side Restrictions Weight Bearing Restrictions: No      Mobility  Bed Mobility Overal bed mobility: Needs Assistance;+2 for physical assistance Bed Mobility: Supine to Sit     Supine to sit: Total assist;+2 for physical assistance;HOB elevated     General bed mobility comments: Pt required +2 assist to transition to a seated position at EOB. Bed pad used to assist with scooting.   Transfers Overall transfer level: Needs assistance Equipment used: 2 person hand held assist Transfers: Sit to/from [employer] Sit to Stand:  Max assist;+2 physical assistance Stand pivot transfers: Max assist;+2 physical assistance       General transfer comment: Pt was able to attempt stand, however +2 assist was required to achieve full standing. Pt required increased time and full support to attempt pivotal steps around to the recliner chair.   Ambulation/Gait             General Gait Details: Unable at this time.   Stairs            Wheelchair Mobility    Modified Rankin (Stroke Patients Only)       Balance Overall balance assessment: Needs assistance Sitting-balance support: Feet supported;No upper extremity supported Sitting balance-Leahy Scale: Poor     Standing balance support: Bilateral upper extremity supported;During functional activity Standing balance-Leahy Scale: Zero                               Pertinent Vitals/Pain Pain Assessment: No/denies pain    Home Living Family/patient expects to be discharged to:: Skilled nursing facility Living Arrangements: Other relatives             Home Equipment: [employer] Mor - single point      Prior Function Level of Independence: Needs assistance   Gait / Transfers Assistance Needed: Pt used cane all the time  ADL's / Homemaking Assistance Needed: Daughter assisted pt with a bath, and laid clothes out for her. Pt was able to dress herself.         Hand Dominance   Dominant Hand: Right    Extremity/Trunk Assessment  Upper Extremity Assessment: Defer to OT evaluation;Generalized weakness           Lower Extremity Assessment: Generalized weakness      Cervical / Trunk Assessment: Normal  Communication   Communication: No difficulties  Cognition Arousal/Alertness: Awake/alert Behavior During Therapy: WFL for tasks assessed/performed;Flat affect Overall Cognitive Status: History of cognitive impairments - at baseline                      General Comments      Exercises        Assessment/Plan     PT Assessment Patient needs continued PT services  PT Diagnosis Difficulty walking;Generalized weakness   PT Problem List Decreased strength;Decreased range of motion;Decreased activity tolerance;Decreased balance;Decreased mobility;Decreased knowledge of use of DME;Decreased safety awareness;Decreased knowledge of precautions;Cardiopulmonary status limiting activity;Pain  PT Treatment Interventions DME instruction;Gait training;Stair training;Functional mobility training;Therapeutic activities;Therapeutic exercise;Neuromuscular re-education;Patient/family education   PT Goals (Current goals can be found in the Care Plan section) Acute Rehab PT Goals Patient Stated Goal: D/C to rehab before going home.  PT Goal Formulation: With patient/family Time For Goal Achievement: 06/14/14 Potential to Achieve Goals: Good    Frequency Min 2X/week   Barriers to discharge        Co-evaluation               End of Session Equipment Utilized During Treatment: Gait belt;Oxygen Activity Tolerance: Patient tolerated treatment well Patient left: in chair;with call bell/phone within reach;with family/visitor present Nurse Communication: Mobility status         Time: 1357-1415 PT Time Calculation (min) (ACUTE ONLY): 18 min   Charges:   PT Evaluation $Initial PT Evaluation Tier I: 1 Procedure PT Treatments $Therapeutic Activity: 8-22 mins   PT G Codes:        Conni Slipper 06-02-14, 2:37 PM  Conni Slipper, PT, DPT Acute Rehabilitation Services Pager: [phone]

## 2014-06-01 LAB — CBC
HCT: 29.3 % — ABNORMAL LOW (ref 36.0–46.0)
Hemoglobin: 9.5 g/dL — ABNORMAL LOW (ref 12.0–15.0)
MCH: 22.4 pg — ABNORMAL LOW (ref 26.0–34.0)
MCHC: 32.4 g/dL (ref 30.0–36.0)
MCV: 68.9 fL — ABNORMAL LOW (ref 78.0–100.0)
Platelets: 231 10*3/uL (ref 150–400)
RBC: 4.25 MIL/uL (ref 3.87–5.11)
RDW: 22 % — AB (ref 11.5–15.5)
WBC: 8.4 10*3/uL (ref 4.0–10.5)

## 2014-06-01 LAB — PROTIME-INR
INR: 1.48 (ref 0.00–1.49)
Prothrombin Time: 18.1 seconds — ABNORMAL HIGH (ref 11.6–15.2)

## 2014-06-01 LAB — HEPARIN LEVEL (UNFRACTIONATED): Heparin Unfractionated: 0.3 IU/mL (ref 0.30–0.70)

## 2014-06-01 MED ORDER — WARFARIN SODIUM 5 MG PO TABS
5.0000 mg | ORAL_TABLET | Freq: Once | ORAL | Status: AC
  Administered 2014-06-01: 5 mg via ORAL
  Filled 2014-06-01: qty 1

## 2014-06-01 MED ORDER — MAGNESIUM HYDROXIDE 400 MG/5ML PO SUSP
30.0000 mL | Freq: Every day | ORAL | Status: DC | PRN
  Administered 2014-06-02: 30 mL via ORAL
  Filled 2014-06-01: qty 30

## 2014-06-01 NOTE — Clinical Social Work Placement (Addendum)
    Clinical Social Work Department CLINICAL SOCIAL WORK PLACEMENT NOTE 05/31/2014  Patient:  [name], [name]  Account Number:  [id number] Admit date:  05/27/2014  Clinical Social Worker:  Lupita Leash CROWDER, LCSW  Date/time:  05/31/2014 04:00 PM  Clinical Social Work is seeking post-discharge placement for this patient at the following level of care:   SKILLED NURSING   (*CSW will update this form in Epic as items are completed)   05/31/2014  Patient/family provided with [facility] Department of Clinical Social Work's list of facilities offering this level of care within the geographic area requested by the patient (or if unable, by the patient's family).  05/31/2014  Patient/family informed of their freedom to choose among providers that offer the needed level of care, that participate in Medicare, Medicaid or managed care program needed by the patient, have an available bed and are willing to accept the patient.  05/31/2014  Patient/family informed of [facility]' ownership interest in [facility], as well as of the fact that they are under no obligation to receive care at this facility.  PASARR submitted to EDS on  PASARR number received on   FL2 transmitted to all facilities in geographic area requested by pt/family on  05/31/2014 FL2 transmitted to all facilities within larger geographic area on   Patient informed that his/her managed care company has contracts with or will negotiate with  certain facilities, including the following:   [facility]- Medicare Complete     Patient/family informed of bed offers received: 06/03/14 Windell Moulding, MSW, LCSWA Updated 06/05/14)   Patient chooses bed at [facility] Physician recommends and patient chooses bed at    Patient to be transferred to  on [facility] on 06/05/14 Windell Moulding, MSW, LCSWA Updated 06/05/14)  Patient to be transferred to facility by Ambulance  (PTAR) Windell Moulding, MSW, LCSWA Updated 06/05/14) Patient and family  notified of transfer on 06/05/14 Windell Moulding, MSW, LCSWA Updated 06/05/14) Name of family member notified: Belenda Cruise Windell Moulding, MSW, LCSWA Updated 06/05/14)  The following physician request were entered in Epic: Physician Request  Please sign FL2.  Please prepare priority discharge summary and prescriptions.    Additional Comments:    Ervin Knack. Hassan Rowan, MSW, [name] [phone] 06/05/2014 5:35 PM Windell Moulding, MSW, LCSWA Updated 06/05/14)

## 2014-06-01 NOTE — Progress Notes (Signed)
Subjective:  Denies any chest pain complains of mild shortness of breath. Still constipated. Abdominal pain improved.  Objective:  Vital Signs in the last 24 hours: Temp:  [97.3 F (36.3 C)-98 F (36.7 C)] 97.3 F (36.3 C) (01/02 0700) Pulse Rate:  [79-91] 86 (01/02 0500) Resp:  [17-26] 20 (01/02 0800) BP: (88-132)/(50-86) 111/80 mmHg (01/02 0800) SpO2:  [86 %-100 %] 86 % (01/02 0800)  Intake/Output from previous day: 01/01 0701 - 01/02 0700 In: 1077 [P.O.:720; I.V.:207; IV Piggyback:150] Out: 25 [Drains:25] Intake/Output from this shift:    Physical Exam: Neck: no adenopathy, no carotid bruit, no JVD and supple, symmetrical, trachea midline Lungs: Decreased breath sound at bases no wheezing rhonchi or rales Heart: irregularly irregular rhythm, S1, S2 normal and Soft systolic murmur noted Abdomen: soft, non-tender; bowel sounds normal; no masses,  no organomegaly Extremities: extremities normal, atraumatic, no cyanosis or edema  Lab Results:  Recent Labs  05/31/14 0315 06/01/14 0234  WBC 11.7* 8.4  HGB 11.3* 9.5*  PLT 156 231   No results for input(s): NA, K, CL, CO2, GLUCOSE, BUN, CREATININE in the last 72 hours. No results for input(s): TROPONINI in the last 72 hours.  Invalid input(s): CK, MB Hepatic Function Panel No results for input(s): PROT, ALBUMIN, AST, ALT, ALKPHOS, BILITOT, BILIDIR, IBILI in the last 72 hours. No results for input(s): CHOL in the last 72 hours. No results for input(s): PROTIME in the last 72 hours.  Imaging: Imaging results have been reviewed and No results found.  Cardiac Studies:  Assessment/Plan:  Cholecystitis/cholelithiasis status post percutaneous cholecystostomy New-onset A. fib with controlled ventricular response Compensated CHF secondary to preserved LV systolic function History of non-Q-wave MI in the past secondary to demand ischemia Mildly elevated troponin I secondary to demand is continue present management chemia  doubt significant MI as patient has similar presentation in the past and noted to have mild CAD. History of recurrent syncope in the past status post loop recorder hypertension Hypercholesteremia Osteoarthritis Anemia GERD Dementia  history of questionable seizure disorder Constipation Plan Continue present management Transfer to telemetry Rx for constipation  LOS: 5 days    [name] N 06/01/2014, 10:09 AM

## 2014-06-01 NOTE — Progress Notes (Signed)
  Subjective: better  Objective: Vital signs in last 24 hours: Temp:  [97.3 F (36.3 C)-98 F (36.7 C)] 97.3 F (36.3 C) (01/02 0700) Pulse Rate:  [79-91] 86 (01/02 0500) Resp:  [17-26] 20 (01/02 0800) BP: (88-132)/(50-86) 111/80 mmHg (01/02 0800) SpO2:  [86 %-100 %] 86 % (01/02 0800) Last BM Date: 05/27/14  Intake/Output from previous day: 01/01 0701 - 01/02 0700 In: 1077 [P.O.:720; I.V.:207; IV Piggyback:150] Out: 25 [Drains:25] Intake/Output this shift:    General appearance: cooperative Resp: clear to auscultation bilaterally Cardio: irregularly irregular rhythm and 80s GI: soft, NT, perc chole tube has some blood clots in it  Lab Results:   Recent Labs  05/31/14 0315 06/01/14 0234  WBC 11.7* 8.4  HGB 11.3* 9.5*  HCT 34.5* 29.3*  PLT 156 PENDING   BMET No results for input(s): NA, K, CL, CO2, GLUCOSE, BUN, CREATININE, CALCIUM in the last 72 hours. PT/INR  Recent Labs  05/31/14 0315 06/01/14 0234  LABPROT 15.3* 18.1*  INR 1.20 1.48   ABG No results for input(s): PHART, HCO3 in the last 72 hours.  Invalid input(s): PCO2, PO2  Studies/Results: No results found.  Anti-infectives: Anti-infectives    Start     Dose/Rate Route Frequency Ordered Stop   05/27/14 2200  piperacillin-tazobactam (ZOSYN) IVPB 3.375 g     3.375 g12.5 mL/hr over 240 Minutes Intravenous 3 times per day 05/27/14 1409     05/27/14 1415  piperacillin-tazobactam (ZOSYN) IVPB 3.375 g     3.375 g100 mL/hr over 30 Minutes Intravenous  Once 05/27/14 1409 05/27/14 1456      Assessment/Plan: Cholecystitis - S/P perc drain during heparin window. Will have RN flush tube. Continue zosyn, low fat diet. We will check again 1/4. AF RVR - per Dr. Sharyn Lull I spoke with her family  LOS: 5 days    Khalani Novoa E 06/01/2014

## 2014-06-01 NOTE — Clinical Social Work Note (Signed)
FL2 on patient's chart for MD signature.  Roddie Mc MSW, [place], [place], [phone]

## 2014-06-01 NOTE — Progress Notes (Addendum)
ANTICOAGULATION CONSULT NOTE - Follow Up Consult  Pharmacy Consult for Heparin and Coumadin Indication: atrial fibrillation  Allergies  Allergen Reactions  . Codeine Other (See Comments)    UNKNOWN; Per MAR    Patient Measurements: Height: 5\' 4"  (162.6 cm) Weight: 99 lb 3.3 oz (45 kg) IBW/kg (Calculated) : 54.7 Heparin Dosing Weight: 45kg  Vital Signs: Temp: 97.3 F (36.3 C) (01/02 0700) Temp Source: Oral (01/02 0700) BP: 111/80 mmHg (01/02 0800) Pulse Rate: 86 (01/02 0500)  Labs:  Recent Labs  05/30/14 0258 05/31/14 0315 06/01/14 0234  HGB 10.7* 11.3* 9.5*  HCT 33.0* 34.5* 29.3*  PLT 148* 156 231  LABPROT  --  15.3* 18.1*  INR  --  1.20 1.48  HEPARINUNFRC 0.28* 0.37 0.30    Estimated Creatinine Clearance: 27.8 mL/min (by C-G formula based on Cr of 1.11).  Medications:  Heparin @ 900 units/hr  Assessment: 84yof continues on heparin for afib. Heparin level is at low-end of goal at 0.3. CBC stable. No bleeding reported.  INR 1.48 today, has had two doses of Coumadin.  CHADSVASC = 6. Baseline INR 1.43. Coumadin score = 3  Goal of Therapy:  Heparin level 0.3-0.7 units/ml Monitor platelets by anticoagulation protocol: Yes   Plan:  1) Will increase heparin drip rate a little to 950 units/hr to keep in goal range. 2) Coumadin 5 mg po x 1 tonight.  Will need Coumadin education prior to discharge. 3) Follow up heparin level, CBC, and INR in AM  Reece Leader, Loura Back, BCPS  Clinical Pharmacist Pager [phone]  06/01/2014 10:23 AM

## 2014-06-01 NOTE — Clinical Social Work Psychosocial (Addendum)
Clinical Social Work Department BRIEF PSYCHOSOCIAL ASSESSMENT 05/31/2014  Patient:  [name], [name]     Account Number:  [id number]     Admit date:  05/27/2014  Clinical Social Worker:  Elam Dutch  Date/Time:  05/31/2014 03:40 PM  Referred by:  RN  Date Referred:  05/30/2014 Referred for  SNF Placement   Other Referral:   Interview type:  Other - See comment Other interview type:   Patient and daughterAuburn Hester    PSYCHOSOCIAL DATA Living Status:  FAMILY Admitted from facility:   Level of care:   Primary support name:  Sherrye Puga (c)  [phone] Primary support relationship to patient:  CHILD, ADULT Degree of support available:   Strong support    CURRENT CONCERNS Current Concerns  Post-Acute Placement   Other Concerns:    SOCIAL WORK ASSESSMENT / PLAN CSW met with patient and her daughter this afternoon to discuss interest in short term SNF placement.  Daughter was very pleased to see CSW and stated "I have been hoping to talk to you."  Patient currently lives at home with her neice and son with plan to return home after d/c.  She has been placed in the past at [facility]. Daugther states that they were fairly pleased with the care she received there.  CSW discussed bed search process and SNF search will be initiated. Fl2 to be placed on chart for MD's signature.  Patient is currently in stepdown [facility]). Daughter states would like return to [facility] or [facility] as tope 2 choices if possible. Active bed search will be initiated.   Assessment/plan status:  Psychosocial Support/Ongoing Assessment of Needs Other assessment/ plan:   Information/referral to community resources:   SNF bed list provided to patient and daughter    PATIENT'S/FAMILY'S RESPONSE TO PLAN OF CARE: Patient is alert and oriented to person and place. Daughter relates that she has some periods of forgetfulness. CSW discussed need for short term SNF with  patient and she is agreeable; she defers to her daughter to make decisions re: health care needs.  CSW will assist with d/c when medically stable. Lorie Phenix. [place], [place]

## 2014-06-01 NOTE — Discharge Instructions (Addendum)
Cholecystostomy  The gallbladder is a pear-shaped organ that lies beneath the liver on the right side of the body. The gallbladder stores bile, a fluid that helps the body digest fats. However, sometimes bile and other fluids build up in the gallbladder because of an obstruction (for example, a gallstones). This can cause fever, pain, swelling, nausea and other serious symptoms. The procedure used to drain these fluids is called a cholecystostomy. A tube is inserted into the gallbladder. Fluid drains through the tube into a plastic bag outside the body. This procedure is usually done on people who are admitted to the hospital. The procedure is often recommended for people who cannot have gallbladder surgery right away, usually because they are too ill to make it through surgery. The cholecystostomy tube is usually temporary, until surgery can be done. RISKS AND COMPLICATIONS Although rare, complications can include:  Clogging of the tube.  Infection in or around the drain site. Antibiotics might be prescribed for the infection. Or, another tube might be inserted to drain the infected fluid.  Internal bleeding from the liver. BEFORE THE PROCEDURE   Try to quit smoking several weeks before the procedure. Smoking can slow healing.  Arrange for someone to drive you home from the hospital.  Right before your procedure, avoid all foods and liquids after midnight. This includes coffee, tea and water.  On the day of the procedure, arrive early to fill out all the paperwork. PROCEDURE You will be given a sedative to make you sleepy and a local anesthetic to numb the skin. Next, a small cut is made in the abdomen. Then a tube is threaded through the cut into the gallbladder. The procedure is usually done with ultrasound to guide the tube into the gallbladder. Once the tube is in place, the drain is secured to the skin with a stitch. The tube is then connected to a drainage bag.  AFTER THE PROCEDURE    People who have a cholecystostomy usually stay in the hospital for several days because they are so ill. You might not be able to eat for the first few days. Instead, you will be connected to an IV for fluids and nutrients.  The procedure does not cure the blockage that caused the fluid to build up in the first place. Because of this, the gallbladder will need to be removed in the future. The drain is removed at that time. HOME CARE INSTRUCTIONS  Be sure to follow your healthcare provider's instructions carefully. You may shower but avoid tub baths and swimming until your caregiver says it is OK. Eat and drink according to the directions you have been given. And be sure to make all follow-up appointments.  Call your healthcare provider if you notice new pain, redness or swelling around the wound. SEEK IMMEDIATE MEDICAL CARE IF:   There is increased abdominal pain.  Nausea or vomiting occurs.  You develop a fever.  The drainage tube comes out of the abdomen. Document Released: 08/13/2008 Document Revised: 08/09/2011 Document Reviewed: 08/13/2008 [facility] Patient Information 2015 [place], [place]. This information is not intended to replace advice given to you by your health care provider. Make sure you discuss any questions you have with your health care provider.    Information on my medicine - Coumadin   (Warfarin)  This medication education was reviewed with me or my healthcare representative as part of my discharge preparation.  The pharmacist that spoke with me during my hospital stay was:  Gardner Candle, [facility]  Why was Coumadin prescribed for you? Coumadin was prescribed for you because you have a blood clot or a medical condition that can cause an increased risk of forming blood clots. Blood clots can cause serious health problems by blocking the flow of blood to the heart, lung, or brain. Coumadin can prevent harmful blood clots from forming. As a reminder your indication for  Coumadin is:   Stroke Prevention Because Of Atrial Fibrillation  What test will check on my response to Coumadin? While on Coumadin (warfarin) you will need to have an INR test regularly to ensure that your dose is keeping you in the desired range. The INR (international normalized ratio) number is calculated from the result of the laboratory test called prothrombin time (PT).  If an INR APPOINTMENT HAS NOT ALREADY BEEN MADE FOR YOU please schedule an appointment to have this lab work done by your health care provider within 7 days. Your INR goal is usually a number between:  2 to 3 or your provider may give you a more narrow range like 2-2.5.  Ask your health care provider during an office visit what your goal INR is.  What  do you need to  know  About  COUMADIN? Take Coumadin (warfarin) exactly as prescribed by your healthcare provider about the same time each day.  DO NOT stop taking without talking to the doctor who prescribed the medication.  Stopping without other blood clot prevention medication to take the place of Coumadin may increase your risk of developing a new clot or stroke.  Get refills before you run out.  What do you do if you miss a dose? If you miss a dose, take it as soon as you remember on the same day then continue your regularly scheduled regimen the next day.  Do not take two doses of Coumadin at the same time.  Important Safety Information A possible side effect of Coumadin (Warfarin) is an increased risk of bleeding. You should call your healthcare provider right away if you experience any of the following: ? Bleeding from an injury or your nose that does not stop. ? Unusual colored urine (red or dark brown) or unusual colored stools (red or black). ? Unusual bruising for unknown reasons. ? A serious fall or if you hit your head (even if there is no bleeding).  Some foods or medicines interact with Coumadin (warfarin) and might alter your response to warfarin. To help  avoid this: ? Eat a balanced diet, maintaining a consistent amount of Vitamin K. ? Notify your provider about major diet changes you plan to make. ? Avoid alcohol or limit your intake to 1 drink for women and 2 drinks for men per day. (1 drink is 5 oz. wine, 12 oz. beer, or 1.5 oz. liquor.)  Make sure that ANY health care provider who prescribes medication for you knows that you are taking Coumadin (warfarin).  Also make sure the healthcare provider who is monitoring your Coumadin knows when you have started a new medication including herbals and non-prescription products.  Coumadin (Warfarin)  Major Drug Interactions  Increased Warfarin Effect Decreased Warfarin Effect  Alcohol (large quantities) Antibiotics (esp. Septra/Bactrim, Flagyl, Cipro) Amiodarone (Cordarone) Aspirin (ASA) Cimetidine (Tagamet) Megestrol (Megace) NSAIDs (ibuprofen, naproxen, etc.) Piroxicam (Feldene) Propafenone (Rythmol SR) Propranolol (Inderal) Isoniazid (INH) Posaconazole (Noxafil) Barbiturates (Phenobarbital) Carbamazepine (Tegretol) Chlordiazepoxide (Librium) Cholestyramine (Questran) Griseofulvin Oral Contraceptives Rifampin Sucralfate (Carafate) Vitamin K   Coumadin (Warfarin) Major Herbal Interactions  Increased Warfarin Effect Decreased Warfarin Effect  Garlic Ginseng Ginkgo biloba Coenzyme Q10 Green tea St. Johns wort    Coumadin (Warfarin) FOOD Interactions  Eat a consistent number of servings per week of foods HIGH in Vitamin K (1 serving =  cup)  Collards (cooked, or boiled & drained) Kale (cooked, or boiled & drained) Mustard greens (cooked, or boiled & drained) Parsley *serving size only =  cup Spinach (cooked, or boiled & drained) Swiss chard (cooked, or boiled & drained) Turnip greens (cooked, or boiled & drained)  Eat a consistent number of servings per week of foods MEDIUM-HIGH in Vitamin K (1 serving = 1 cup)  Asparagus (cooked, or boiled & drained) Broccoli  (cooked, boiled & drained, or raw & chopped) Brussel sprouts (cooked, or boiled & drained) *serving size only =  cup Lettuce, raw (green leaf, endive, romaine) Spinach, raw Turnip greens, raw & chopped   These websites have more information on Coumadin (warfarin):  http://www.king-russell.com/; https://www.hines.net/;   Abdominal Pain, Women Abdominal (stomach, pelvic, or belly) pain can be caused by many things. It is important to tell your doctor:  The location of the pain.  Does it come and go or is it present all the time?  Are there things that start the pain (eating certain foods, exercise)?  Are there other symptoms associated with the pain (fever, nausea, vomiting, diarrhea)? All of this is helpful to know when trying to find the cause of the pain. CAUSES   Stomach: virus or bacteria infection, or ulcer.  Intestine: appendicitis (inflamed appendix), regional ileitis (Crohn's disease), ulcerative colitis (inflamed colon), irritable bowel syndrome, diverticulitis (inflamed diverticulum of the colon), or cancer of the stomach or intestine.  Gallbladder disease or stones in the gallbladder.  Kidney disease, kidney stones, or infection.  Pancreas infection or cancer.  Fibromyalgia (pain disorder).  Diseases of the female organs:  Uterus: fibroid (non-cancerous) tumors or infection.  Fallopian tubes: infection or tubal pregnancy.  Ovary: cysts or tumors.  Pelvic adhesions (scar tissue).  Endometriosis (uterus lining tissue growing in the pelvis and on the pelvic organs).  Pelvic congestion syndrome (female organs filling up with blood just before the menstrual period).  Pain with the menstrual period.  Pain with ovulation (producing an egg).  Pain with an IUD (intrauterine device, birth control) in the uterus.  Cancer of the female organs.  Functional pain (pain not caused by a disease, may improve without treatment).  Psychological  pain.  Depression. DIAGNOSIS  Your doctor will decide the seriousness of your pain by doing an examination.  Blood tests.  X-rays.  Ultrasound.  CT scan (computed tomography, special type of X-ray).  MRI (magnetic resonance imaging).  Cultures, for infection.  Barium enema (dye inserted in the large intestine, to better view it with X-rays).  Colonoscopy (looking in intestine with a lighted tube).  Laparoscopy (minor surgery, looking in abdomen with a lighted tube).  Major abdominal exploratory surgery (looking in abdomen with a large incision). TREATMENT  The treatment will depend on the cause of the pain.   Many cases can be observed and treated at home.  Over-the-counter medicines recommended by your caregiver.  Prescription medicine.  Antibiotics, for infection.  Birth control pills, for painful periods or for ovulation pain.  Hormone treatment, for endometriosis.  Nerve blocking injections.  Physical therapy.  Antidepressants.  Counseling with a psychologist or psychiatrist.  Minor or major surgery. HOME CARE INSTRUCTIONS   Do not take laxatives, unless directed by your caregiver.  Take over-the-counter pain medicine only if ordered  by your caregiver. Do not take aspirin because it can cause an upset stomach or bleeding.  Try a clear liquid diet (broth or water) as ordered by your caregiver. Slowly move to a bland diet, as tolerated, if the pain is related to the stomach or intestine.  Have a thermometer and take your temperature several times a day, and record it.  Bed rest and sleep, if it helps the pain.  Avoid sexual intercourse, if it causes pain.  Avoid stressful situations.  Keep your follow-up appointments and tests, as your caregiver orders.  If the pain does not go away with medicine or surgery, you may try:  Acupuncture.  Relaxation exercises (yoga, meditation).  Group therapy.  Counseling. SEEK MEDICAL CARE IF:   You  notice certain foods cause stomach pain.  Your home care treatment is not helping your pain.  You need stronger pain medicine.  You want your IUD removed.  You feel faint or lightheaded.  You develop nausea and vomiting.  You develop a rash.  You are having side effects or an allergy to your medicine. SEEK IMMEDIATE MEDICAL CARE IF:   Your pain does not go away or gets worse.  You have a fever.  Your pain is felt only in portions of the abdomen. The right side could possibly be appendicitis. The left lower portion of the abdomen could be colitis or diverticulitis.  You are passing blood in your stools (bright red or black tarry stools, with or without vomiting).  You have blood in your urine.  You develop chills, with or without a fever.  You pass out. MAKE SURE YOU:   Understand these instructions.  Will watch your condition.  Will get help right away if you are not doing well or get worse. Document Released: 03/14/2007 Document Revised: 10/01/2013 Document Reviewed: 04/03/2009 [facility] Patient Information 2015 [place], [place]. This information is not intended to replace advice given to you by your health care provider. Make sure you discuss any questions you have with your health care provider.  Atrial Fibrillation Atrial fibrillation is a type of irregular heart rhythm (arrhythmia). During atrial fibrillation, the upper chambers of the heart (atria) quiver continuously in a chaotic pattern. This causes an irregular and often rapid heart rate.  Atrial fibrillation is the result of the heart becoming overloaded with disorganized signals that tell it to beat. These signals are normally released one at a time by a part of the right atrium called the sinoatrial node. They then travel from the atria to the lower chambers of the heart (ventricles), causing the atria and ventricles to contract and pump blood as they pass. In atrial fibrillation, parts of the atria outside of the  sinoatrial node also release these signals. This results in two problems. First, the atria receive so many signals that they do not have time to fully contract. Second, the ventricles, which can only receive one signal at a time, beat irregularly and out of rhythm with the atria.  There are three types of atrial fibrillation:   Paroxysmal. Paroxysmal atrial fibrillation starts suddenly and stops on its own within a week.  Persistent. Persistent atrial fibrillation lasts for more than a week. It may stop on its own or with treatment.  Permanent. Permanent atrial fibrillation does not go away. Episodes of atrial fibrillation may lead to permanent atrial fibrillation. Atrial fibrillation can prevent your heart from pumping blood normally. It increases your risk of stroke and can lead to heart failure.  CAUSES  Heart conditions, including a heart attack, heart failure, coronary artery disease, and heart valve conditions.   Inflammation of the sac that surrounds the heart (pericarditis).  Blockage of an artery in the lungs (pulmonary embolism).  Pneumonia or other infections.  Chronic lung disease.  Thyroid problems, especially if the thyroid is overactive (hyperthyroidism).  Caffeine, excessive alcohol use, and use of some illegal drugs.   Use of some medicines, including certain decongestants and diet pills.  Heart surgery.   Birth defects.  Sometimes, no cause can be found. When this happens, the atrial fibrillation is called lone atrial fibrillation. The risk of complications from atrial fibrillation increases if you have lone atrial fibrillation and you are age 50 years or older. RISK FACTORS  Heart failure.  Coronary artery disease.  Diabetes mellitus.   High blood pressure (hypertension).   Obesity.   Other arrhythmias.   Increased age. SIGNS AND SYMPTOMS   A feeling that your heart is beating rapidly or irregularly.   A feeling of discomfort or pain in  your chest.   Shortness of breath.   Sudden light-headedness or weakness.   Getting tired easily when exercising.   Urinating more often than normal (mainly when atrial fibrillation first begins).  In paroxysmal atrial fibrillation, symptoms may start and suddenly stop. DIAGNOSIS  Your health care provider may be able to detect atrial fibrillation when taking your pulse. Your health care provider may have you take a test called an ambulatory electrocardiogram (ECG). An ECG records your heartbeat patterns over a 24-hour period. You may also have other tests, such as:  Transthoracic echocardiogram (TTE). During echocardiography, sound waves are used to evaluate how blood flows through your heart.  Transesophageal echocardiogram (TEE).  Stress test. There is more than one type of stress test. If a stress test is needed, ask your health care provider about which type is best for you.  Chest X-ray exam.  Blood tests.  Computed tomography (CT). TREATMENT  Treatment may include:  Treating any underlying conditions. For example, if you have an overactive thyroid, treating the condition may correct atrial fibrillation.  Taking medicine. Medicines may be given to control a rapid heart rate or to prevent blood clots, heart failure, or a stroke.  Having a procedure to correct the rhythm of the heart:  Electrical cardioversion. During electrical cardioversion, a controlled, low-energy shock is delivered to the heart through your skin. If you have chest pain, very low blood pressure, or sudden heart failure, this procedure may need to be done as an emergency.  Catheter ablation. During this procedure, heart tissues that send the signals that cause atrial fibrillation are destroyed.  Surgical ablation. During this surgery, thin lines of heart tissue that carry the abnormal signals are destroyed. This procedure can either be an open-heart surgery or a minimally invasive surgery. With the  minimally invasive surgery, small cuts are made to access the heart instead of a large opening.  Pulmonary venous isolation. During this surgery, tissue around the veins that carry blood from the lungs (pulmonary veins) is destroyed. This tissue is thought to carry the abnormal signals. HOME CARE INSTRUCTIONS   Take medicines only as directed by your health care provider. Some medicines can make atrial fibrillation worse or recur.  If blood thinners were prescribed by your health care provider, take them exactly as directed. Too much blood-thinning medicine can cause bleeding. If you take too little, you will not have the needed protection against stroke and other problems.  Perform  blood tests at home if directed by your health care provider. Perform blood tests exactly as directed.  Quit smoking if you smoke.  Do not drink alcohol.  Do not drink caffeinated beverages such as coffee, soda, and some teas. You may drink decaffeinated coffee, soda, or tea.   Maintain a healthy weight.Do not use diet pills unless your health care provider approves. They may make heart problems worse.   Follow diet instructions as directed by your health care provider.  Exercise regularly as directed by your health care provider.  Keep all follow-up visits as directed by your health care provider. This is important. PREVENTION  The following substances can cause atrial fibrillation to recur:   Caffeinated beverages.  Alcohol.  Certain medicines, especially those used for breathing problems.  Certain herbs and herbal medicines, such as those containing ephedra or ginseng.  Illegal drugs, such as cocaine and amphetamines. Sometimes medicines are given to prevent atrial fibrillation from recurring. Proper treatment of any underlying condition is also important in helping prevent recurrence.  SEEK MEDICAL CARE IF:  You notice a change in the rate, rhythm, or strength of your heartbeat.  You  suddenly begin urinating more frequently.  You tire more easily when exerting yourself or exercising. SEEK IMMEDIATE MEDICAL CARE IF:   You have chest pain, abdominal pain, sweating, or weakness.  You feel nauseous.  You have shortness of breath.  You suddenly have swollen feet and ankles.  You feel dizzy.  Your face or limbs feel numb or weak.  You have a change in your vision or speech. MAKE SURE YOU:   Understand these instructions.  Will watch your condition.  Will get help right away if you are not doing well or get worse. Document Released: 05/17/2005 Document Revised: 10/01/2013 Document Reviewed: 06/27/2012 [facility] Patient Information 2015 [place], [place]. This information is not intended to replace advice given to you by your health care provider. Make sure you discuss any questions you have with your health care provider.  Atrial Fibrillation Atrial fibrillation is a type of irregular heart rhythm (arrhythmia). During atrial fibrillation, the upper chambers of the heart (atria) quiver continuously in a chaotic pattern. This causes an irregular and often rapid heart rate.  Atrial fibrillation is the result of the heart becoming overloaded with disorganized signals that tell it to beat. These signals are normally released one at a time by a part of the right atrium called the sinoatrial node. They then travel from the atria to the lower chambers of the heart (ventricles), causing the atria and ventricles to contract and pump blood as they pass. In atrial fibrillation, parts of the atria outside of the sinoatrial node also release these signals. This results in two problems. First, the atria receive so many signals that they do not have time to fully contract. Second, the ventricles, which can only receive one signal at a time, beat irregularly and out of rhythm with the atria.  There are three types of atrial fibrillation:   Paroxysmal. Paroxysmal atrial fibrillation starts  suddenly and stops on its own within a week.  Persistent. Persistent atrial fibrillation lasts for more than a week. It may stop on its own or with treatment.  Permanent. Permanent atrial fibrillation does not go away. Episodes of atrial fibrillation may lead to permanent atrial fibrillation. Atrial fibrillation can prevent your heart from pumping blood normally. It increases your risk of stroke and can lead to heart failure.  CAUSES   Heart conditions, including a  heart attack, heart failure, coronary artery disease, and heart valve conditions.   Inflammation of the sac that surrounds the heart (pericarditis).  Blockage of an artery in the lungs (pulmonary embolism).  Pneumonia or other infections.  Chronic lung disease.  Thyroid problems, especially if the thyroid is overactive (hyperthyroidism).  Caffeine, excessive alcohol use, and use of some illegal drugs.   Use of some medicines, including certain decongestants and diet pills.  Heart surgery.   Birth defects.  Sometimes, no cause can be found. When this happens, the atrial fibrillation is called lone atrial fibrillation. The risk of complications from atrial fibrillation increases if you have lone atrial fibrillation and you are age 57 years or older. RISK FACTORS  Heart failure.  Coronary artery disease.  Diabetes mellitus.   High blood pressure (hypertension).   Obesity.   Other arrhythmias.   Increased age. SIGNS AND SYMPTOMS   A feeling that your heart is beating rapidly or irregularly.   A feeling of discomfort or pain in your chest.   Shortness of breath.   Sudden light-headedness or weakness.   Getting tired easily when exercising.   Urinating more often than normal (mainly when atrial fibrillation first begins).  In paroxysmal atrial fibrillation, symptoms may start and suddenly stop. DIAGNOSIS  Your health care provider may be able to detect atrial fibrillation when taking your  pulse. Your health care provider may have you take a test called an ambulatory electrocardiogram (ECG). An ECG records your heartbeat patterns over a 24-hour period. You may also have other tests, such as:  Transthoracic echocardiogram (TTE). During echocardiography, sound waves are used to evaluate how blood flows through your heart.  Transesophageal echocardiogram (TEE).  Stress test. There is more than one type of stress test. If a stress test is needed, ask your health care provider about which type is best for you.  Chest X-ray exam.  Blood tests.  Computed tomography (CT). TREATMENT  Treatment may include:  Treating any underlying conditions. For example, if you have an overactive thyroid, treating the condition may correct atrial fibrillation.  Taking medicine. Medicines may be given to control a rapid heart rate or to prevent blood clots, heart failure, or a stroke.  Having a procedure to correct the rhythm of the heart:  Electrical cardioversion. During electrical cardioversion, a controlled, low-energy shock is delivered to the heart through your skin. If you have chest pain, very low blood pressure, or sudden heart failure, this procedure may need to be done as an emergency.  Catheter ablation. During this procedure, heart tissues that send the signals that cause atrial fibrillation are destroyed.  Surgical ablation. During this surgery, thin lines of heart tissue that carry the abnormal signals are destroyed. This procedure can either be an open-heart surgery or a minimally invasive surgery. With the minimally invasive surgery, small cuts are made to access the heart instead of a large opening.  Pulmonary venous isolation. During this surgery, tissue around the veins that carry blood from the lungs (pulmonary veins) is destroyed. This tissue is thought to carry the abnormal signals. HOME CARE INSTRUCTIONS   Take medicines only as directed by your health care provider. Some  medicines can make atrial fibrillation worse or recur.  If blood thinners were prescribed by your health care provider, take them exactly as directed. Too much blood-thinning medicine can cause bleeding. If you take too little, you will not have the needed protection against stroke and other problems.  Perform blood tests at home  if directed by your health care provider. Perform blood tests exactly as directed.  Quit smoking if you smoke.  Do not drink alcohol.  Do not drink caffeinated beverages such as coffee, soda, and some teas. You may drink decaffeinated coffee, soda, or tea.   Maintain a healthy weight.Do not use diet pills unless your health care provider approves. They may make heart problems worse.   Follow diet instructions as directed by your health care provider.  Exercise regularly as directed by your health care provider.  Keep all follow-up visits as directed by your health care provider. This is important. PREVENTION  The following substances can cause atrial fibrillation to recur:   Caffeinated beverages.  Alcohol.  Certain medicines, especially those used for breathing problems.  Certain herbs and herbal medicines, such as those containing ephedra or ginseng.  Illegal drugs, such as cocaine and amphetamines. Sometimes medicines are given to prevent atrial fibrillation from recurring. Proper treatment of any underlying condition is also important in helping prevent recurrence.  SEEK MEDICAL CARE IF:  You notice a change in the rate, rhythm, or strength of your heartbeat.  You suddenly begin urinating more frequently.  You tire more easily when exerting yourself or exercising. SEEK IMMEDIATE MEDICAL CARE IF:   You have chest pain, abdominal pain, sweating, or weakness.  You feel nauseous.  You have shortness of breath.  You suddenly have swollen feet and ankles.  You feel dizzy.  Your face or limbs feel numb or weak.  You have a change in your  vision or speech. MAKE SURE YOU:   Understand these instructions.  Will watch your condition.  Will get help right away if you are not doing well or get worse. Document Released: 05/17/2005 Document Revised: 10/01/2013 Document Reviewed: 06/27/2012 [facility] Patient Information 2015 [place], [place]. This information is not intended to replace advice given to you by your health care provider. Make sure you discuss any questions you have with your health care provider.  Aspirin and Your Heart Aspirin affects the way your blood clots and helps "thin" the blood. Aspirin has many uses in heart disease. It may be used as a primary prevention to help reduce the risk of heart related events. It also can be used as a secondary measure to prevent more heart attacks or to prevent additional damage from blood clots.  ASPIRIN MAY HELP IF YOU:  Have had a heart attack or chest pain.  Have undergone open heart surgery such as CABG (Coronary Artery Bypass Surgery).  Have had coronary angioplasty with or without stents.  Have experienced a stroke or TIA (transient ischemic attack).  Have peripheral vascular disease (PAD).  Have chronic heart rhythm problems such as atrial fibrillation.  Are at risk for heart disease. BEFORE STARTING ASPIRIN Before you start taking aspirin, your caregiver will need to review your medical history. Many things will need to be taken into consideration, such as:  Smoking status.  Blood pressure.  Diabetes.  Gender.  Weight.  Cholesterol level. ASPIRIN DOSES  Aspirin should only be taken on the advice of your caregiver. Talk to your caregiver about how much aspirin you should take. Aspirin comes in different doses such as:  81 mg.  162 mg.  325 mg.  The aspirin dose you take may be affected by many factors, some of which include:  Your current medications, especially if your are taking blood-thinners or anti-platelet medicine.  Liver  function.  Heart disease risk.  Age.  Aspirin comes in  two forms:  Non-enteric-coated. This type of aspirin does not have a coating and is absorbed faster. Non-enteric coated aspirin is recommended for patients experiencing chest pain symptoms. This type of aspirin also comes in a chewable form.  Enteric-coated. This means the aspirin has a special coating that releases the medicine very slowly. Enteric-coated aspirin causes less stomach upset. This type of aspirin should not be chewed or crushed. ASPIRIN SIDE EFFECTS Daily use of aspirin can increase your risk of serious side effects. Some of these include:  Increased bleeding. This can range from a cut that does not stop bleeding to more serious problems such as stomach bleeding or bleeding into the brain (Intracerebral bleeding).  Increased bruising.  Stomach upset.  An allergic reaction such as red, itchy skin.  Increased risk of bleeding when combined with non-steroidal anti-inflammatory medicine (NSAIDS).  Alcohol should be drank in moderation when taking aspirin. Alcohol can increase the risk of stomach bleeding when taken with aspirin.  Aspirin should not be given to children less than 79 years of age due to the association of Reye syndrome. Reye syndrome is a serious illness that can affect the brain and liver. Studies have linked Reye syndrome with aspirin use in children.  People that have nasal polyps have an increased risk of developing an aspirin allergy. SEEK MEDICAL CARE IF:   You develop an allergic reaction such as:  Hives.  Itchy skin.  Swelling of the lips, tongue or face.  You develop stomach pain.  You have unusual bleeding or bruising.  You have ringing in your ears. SEEK IMMEDIATE MEDICAL CARE IF:   You have severe chest pain, especially if the pain is crushing or pressure-like and spreads to the arms, back, neck, or jaw. THIS IS AN EMERGENCY. Do not wait to see if the pain will go away. Get  medical help at once. Call your local emergency services (911 in the [place] DO NOT drive yourself to the hospital.  You have stroke-like symptoms such as:  Loss of vision.  Difficulty talking.  Numbness or weakness on one side of your body.  Numbness or weakness in your arm or leg.  Not thinking clearly or feeling confused.  Your bowel movements are bloody, dark red or black in color.  You vomit or cough up blood.  You have blood in your urine.  You have shortness of breath, coughing or wheezing. MAKE SURE YOU:   Understand these instructions.  Will monitor your condition.  Seek immediate medical care if necessary. Document Released: 04/29/2008 Document Revised: 09/11/2012 Document Reviewed: 08/22/2013 [facility] Patient Information 2015 [place], [place]. This information is not intended to replace advice given to you by your health care provider. Make sure you discuss any questions you have with your health care provider.

## 2014-06-01 NOTE — Progress Notes (Signed)
Attn Dr. Sharyn Lull - at discharge please consider prescribing Klor-Con 10 meq tabs, take 2 daily INSTEAD of the KDur 20 meq tabs she was previously taking.  She cannot swallow the bigger tablets.  Thanks!  Tad Moore, BCPS  Clinical Pharmacist Pager [phone]  06/01/2014 10:41 AM

## 2014-06-02 LAB — BODY FLUID CULTURE
Gram Stain: NONE SEEN
Special Requests: NORMAL

## 2014-06-02 LAB — PROTIME-INR
INR: 2.07 — ABNORMAL HIGH (ref 0.00–1.49)
Prothrombin Time: 23.5 seconds — ABNORMAL HIGH (ref 11.6–15.2)

## 2014-06-02 LAB — CBC
HEMATOCRIT: 29 % — AB (ref 36.0–46.0)
Hemoglobin: 9.6 g/dL — ABNORMAL LOW (ref 12.0–15.0)
MCH: 22 pg — ABNORMAL LOW (ref 26.0–34.0)
MCHC: 33.1 g/dL (ref 30.0–36.0)
MCV: 66.5 fL — ABNORMAL LOW (ref 78.0–100.0)
Platelets: 215 10*3/uL (ref 150–400)
RBC: 4.36 MIL/uL (ref 3.87–5.11)
RDW: 21.5 % — ABNORMAL HIGH (ref 11.5–15.5)
WBC: 7.6 10*3/uL (ref 4.0–10.5)

## 2014-06-02 LAB — HEPARIN LEVEL (UNFRACTIONATED): HEPARIN UNFRACTIONATED: 0.19 [IU]/mL — AB (ref 0.30–0.70)

## 2014-06-02 MED ORDER — WARFARIN SODIUM 2.5 MG PO TABS
2.5000 mg | ORAL_TABLET | Freq: Once | ORAL | Status: AC
  Administered 2014-06-02: 2.5 mg via ORAL
  Filled 2014-06-02: qty 1

## 2014-06-02 NOTE — Progress Notes (Addendum)
PHARMACY CONSULT NOTE   Pharmacy Consult for :  Coumadin with Heparin bridging :  Zosyn Indication:  atrial fibrillation :  Intra-abdominal infection  Dosing weight 45 kg  Labs:  Recent Labs Lab 05/28/14 0950 05/31/14 0315 06/01/14 0234 06/02/14 0320  HGB  --  11.3* 9.5* 9.6*  HCT  --  34.5* 29.3* 29.0*  WBC  --  11.7* 8.4 7.6  PLT  --  156 231 215  INR 1.43 1.20 1.48 2.07*  APTT 33  --   --   --    *   *   *   *   *    05/31/14 0315 06/01/14 0234 06/02/14 0320  HGB 11.3* 9.5* 9.6*  HCT 34.5* 29.3* 29.0*  PLT 156 231 215  INR 1.20 1.48 2.07*  HEPARINUNFRC 0.37 0.30 0.19*   Estimated Creatinine Clearance: 27.8 mL/min (by C-G formula based on Cr of 1.11).  Current Medication[s] Include: Scheduled:  Scheduled:  . antiseptic oral rinse  7 mL Mouth Rinse BID  . aspirin EC  81 mg Oral Daily  . diltiazem  30 mg Oral 4 times per day  . feeding supplement (RESOURCE BREEZE)  1 Container Oral BID BM  . pantoprazole (PROTONIX) IV  40 mg Intravenous Q24H  . piperacillin-tazobactam (ZOSYN)  IV  3.375 g Intravenous 3 times per day  . Warfarin - Pharmacist Dosing Inpatient   Does not apply q1800   Infusion[s]: Infusions:  . sodium chloride 50 mL/hr at 06/02/14 0043   Antibiotic[s]: Anti-infectives    Start     Dose/Rate Route Frequency Ordered Stop   05/27/14 2200  piperacillin-tazobactam (ZOSYN) IVPB 3.375 g     3.375 g12.5 mL/hr over 240 Minutes Intravenous 3 times per day 05/27/14 1409     05/27/14 1415  piperacillin-tazobactam (ZOSYN) IVPB 3.375 g     3.375 g100 mL/hr over 30 Minutes Intravenous  Once 05/27/14 1409 05/27/14 1456     Assessment: Day # 4/5 [minimum] Coumadin/Heparin overlap for atrial fibrillation  Day # 7 of Zosyn for intra-abdominal infection.  Drain still in place. WBC 7.6, Patient Afebrile. Renal function stable. Heparin level, after IV restart, 0.19 units/hr.  This is subtherapeutic.  Patient needs at least another 24 hours of Heparin/Coumadin  overlap despite today's INR > 2.  INR 2.07.  Of note, INR with large jump overnight. No evidence of bleeding complications noted   Goal of Therapy:   INR 2-3  Heparin level 0.3-0.7 units/ml   Plan:  1. Increase Heparin infusion to 1100 units/hr 2. Next Heparin level in 8 hours. 3. Reduce Coumadin to 2.5 mg po today. 4. Daily Heparin Levels, INR, Platelet counts, CBC.  Monitor for bleeding complications.  5. Continue Zosyn as previously ordered.  Amadeus Oyama, Elisha Headland,  Pharm.D  06/02/2014, 4:04 PM   *   *   *   *   *  06/02/2014, 4:23 PM    ADDENDUM   Dr. Sharyn Lull discontinued Heparin infusion.  Will cancel rate changes, Heparin labs.  PLAN:  1. Continue Coumadin as previously ordered.  Cristan Hout, Elisha Headland,  Pharm.D.

## 2014-06-02 NOTE — Progress Notes (Signed)
Subjective:  Patient denies any chest pain or shortness of breath. Remains in A. fib with controlled ventricular response occasional episodes of nonsustained VT on the monitor is symptomatic  Objective:  Vital Signs in the last 24 hours: Temp:  [97.4 F (36.3 C)-98.3 F (36.8 C)] 98.3 F (36.8 C) (01/03 0434) Pulse Rate:  [76-92] 76 (01/03 0434) Resp:  [18-27] 18 (01/03 0434) BP: (101-125)/(58-81) 108/73 mmHg (01/03 0434) SpO2:  [96 %-100 %] 98 % (01/03 0434)  Intake/Output from previous day: 01/02 0701 - 01/03 0700 In: 1016.6 [P.O.:720; I.V.:296.6] Out: 50 [Drains:50] Intake/Output from this shift:    Physical Exam: Neck: no adenopathy, no carotid bruit, no JVD and supple, symmetrical, trachea midline Lungs: Decreased breath sound at bases Heart: irregularly irregular rhythm, S1, S2 normal and Soft systolic murmur noted Abdomen: soft, non-tender; bowel sounds normal; no masses,  no organomegaly Extremities: extremities normal, atraumatic, no cyanosis or edema  Lab Results:  Recent Labs  06/01/14 0234 06/02/14 0320  WBC 8.4 7.6  HGB 9.5* 9.6*  PLT 231 215   No results for input(s): NA, K, CL, CO2, GLUCOSE, BUN, CREATININE in the last 72 hours. No results for input(s): TROPONINI in the last 72 hours.  Invalid input(s): CK, MB Hepatic Function Panel No results for input(s): PROT, ALBUMIN, AST, ALT, ALKPHOS, BILITOT, BILIDIR, IBILI in the last 72 hours. No results for input(s): CHOL in the last 72 hours. No results for input(s): PROTIME in the last 72 hours.  Imaging: Imaging results have been reviewed and No results found.  Cardiac Studies:  Assessment/Plan:  Cholecystitis/cholelithiasis status post percutaneous cholecystostomy New-onset A. fib with controlled ventricular response Compensated CHF secondary to preserved LV systolic function History of non-Q-wave MI in the past secondary to demand ischemia Mildly elevated troponin I secondary to demand  ischemia In an  doubt significant MI as patient has similar presentation in the past and noted to have mild CAD. History of recurrent syncope in the past status post loop recorder hypertension Hypercholesteremia Osteoarthritis Anemia GERD Dementia  history of questionable seizure disorder Plan Continue present management Increase ambulation as tolerated Check labs in a.m.  LOS: 6 days     Arbadella Kimbler N 06/02/2014, 9:25 AM

## 2014-06-02 NOTE — Progress Notes (Addendum)
ANTICOAGULATION CONSULT NOTE - Follow Up Consult  Pharmacy Consult for Heparin Indication: atrial fibrillation  Allergies  Allergen Reactions  . Codeine Other (See Comments)    UNKNOWN; Per MAR    Patient Measurements: Height: 5\' 4"  (162.6 cm) Weight: 99 lb 3.3 oz (45 kg) IBW/kg (Calculated) : 54.7 Heparin Dosing Weight: 45 kg  Vital Signs: Temp: 98.3 F (36.8 C) (01/03 0434) Temp Source: Oral (01/03 0434) BP: 108/73 mmHg (01/03 0434) Pulse Rate: 76 (01/03 0434)  Labs:  Recent Labs  05/31/14 0315 06/01/14 0234 06/02/14 0320  HGB 11.3* 9.5* 9.6*  HCT 34.5* 29.3* 29.0*  PLT 156 231 215  LABPROT 15.3* 18.1* 23.5*  INR 1.20 1.48 2.07*  HEPARINUNFRC 0.37 0.30 0.19*    Estimated Creatinine Clearance: 27.8 mL/min (by C-G formula based on Cr of 1.11).   Medications:  Infusions:  . sodium chloride 50 mL/hr at 06/02/14 0043  . heparin 950 Units/hr (06/02/14 0043)    Assessment: 84 yoF continues on heparin bridge with warfarin for Afib. Heparin level had been at low-end of goal at 0.3, so heparin drip rate was increased to 950 units/hr on 1/2 to keep her within goal range. Overnight patient reportedly pulled IV out for an unknown time period (documented on MAR for at least 30 minutes but could have been up to 2 hours). AM heparin level drawn about 3 hours after heparin restart.  AM HL was subtherapeutic at 0.19.  Hgb and platelets are stable. No bleeding noted. Drip appears to be running appropriately this morning. Of note, INR appears to be therapeutic today at 2.07.  Goal of Therapy:  Heparin level 0.3-0.7 units/ml Monitor platelets by anticoagulation protocol: Yes   Plan:  Continue Heparin drip at 950 units/hr Check repeat heparin level @ 1500 Monitor daily HL, CBC, signs of bleeding  Russ Halo, PharmD Clinical Pharmacist - Resident Pager: [phone] 1/3/20168:25 AM

## 2014-06-03 LAB — BASIC METABOLIC PANEL
ANION GAP: 6 (ref 5–15)
BUN: 14 mg/dL (ref 6–23)
CHLORIDE: 108 meq/L (ref 96–112)
CO2: 23 mmol/L (ref 19–32)
Calcium: 8.1 mg/dL — ABNORMAL LOW (ref 8.4–10.5)
Creatinine, Ser: 0.62 mg/dL (ref 0.50–1.10)
GFR calc Af Amer: >90 mL/min (ref >90)
GFR calc non Af Amer: 82 mL/min — ABNORMAL LOW (ref >90)
Glucose, Bld: 101 mg/dL — ABNORMAL HIGH (ref 70–99)
POTASSIUM: 3.3 mmol/L — AB (ref 3.5–5.1)
Sodium: 137 mmol/L (ref 135–145)

## 2014-06-03 LAB — CBC
HEMATOCRIT: 29.8 % — AB (ref 36.0–46.0)
HEMOGLOBIN: 9.8 g/dL — AB (ref 12.0–15.0)
MCH: 22.1 pg — ABNORMAL LOW (ref 26.0–34.0)
MCHC: 32.9 g/dL (ref 30.0–36.0)
MCV: 67.3 fL — ABNORMAL LOW (ref 78.0–100.0)
Platelets: 251 10*3/uL (ref 150–400)
RBC: 4.43 MIL/uL (ref 3.87–5.11)
RDW: 21.8 % — ABNORMAL HIGH (ref 11.5–15.5)
WBC: 7.2 10*3/uL (ref 4.0–10.5)

## 2014-06-03 LAB — PROTIME-INR
INR: 2.73 — ABNORMAL HIGH (ref 0.00–1.49)
PROTHROMBIN TIME: 29.2 s — AB (ref 11.6–15.2)

## 2014-06-03 LAB — MAGNESIUM: Magnesium: 2.2 mg/dL (ref 1.5–2.5)

## 2014-06-03 MED ORDER — PANTOPRAZOLE SODIUM 40 MG PO TBEC
40.0000 mg | DELAYED_RELEASE_TABLET | Freq: Every day | ORAL | Status: DC
  Administered 2014-06-03 – 2014-06-05 (×3): 40 mg via ORAL
  Filled 2014-06-03: qty 1

## 2014-06-03 MED ORDER — POTASSIUM CHLORIDE 20 MEQ/15ML (10%) PO SOLN
40.0000 meq | Freq: Once | ORAL | Status: AC
  Administered 2014-06-03: 40 meq via ORAL
  Filled 2014-06-03: qty 30

## 2014-06-03 MED ORDER — METRONIDAZOLE 500 MG PO TABS
500.0000 mg | ORAL_TABLET | Freq: Three times a day (TID) | ORAL | Status: DC
  Administered 2014-06-03 (×2): 500 mg via ORAL
  Filled 2014-06-03 (×5): qty 1

## 2014-06-03 MED ORDER — POTASSIUM CHLORIDE CRYS ER 10 MEQ PO TBCR: 10.0000 meq | EXTENDED_RELEASE_TABLET | Freq: Two times a day (BID) | ORAL | Status: DC

## 2014-06-03 MED ORDER — WARFARIN SODIUM 1 MG PO TABS
1.0000 mg | ORAL_TABLET | Freq: Once | ORAL | Status: AC
  Administered 2014-06-03: 1 mg via ORAL
  Filled 2014-06-03: qty 1

## 2014-06-03 MED ORDER — DIGOXIN 0.25 MG/ML IJ SOLN
0.1250 mg | Freq: Every day | INTRAMUSCULAR | Status: DC
  Administered 2014-06-03 – 2014-06-05 (×3): 0.125 mg via INTRAVENOUS
  Filled 2014-06-03 (×4): qty 0.5

## 2014-06-03 MED ORDER — CIPROFLOXACIN HCL 250 MG PO TABS
250.0000 mg | ORAL_TABLET | Freq: Two times a day (BID) | ORAL | Status: DC
  Administered 2014-06-03: 250 mg via ORAL
  Filled 2014-06-03 (×4): qty 1

## 2014-06-03 NOTE — Progress Notes (Signed)
Subjective:  Doing well denies any chest pain or shortness of breath denies abdominal pain. Occasional episode of A. fib with RVR on the monitor digoxin digoxin  Objective:  Vital Signs in the last 24 hours: Temp:  [97.8 F (36.6 C)-97.9 F (36.6 C)] 97.9 F (36.6 C) (01/04 1542) Pulse Rate:  [90-103] 96 (01/04 1542) Resp:  [18-20] 20 (01/04 1542) BP: (116-137)/(75-98) 116/86 mmHg (01/04 1542) SpO2:  [97 %-99 %] 99 % (01/04 1542)  Intake/Output from previous day: 01/03 0701 - 01/04 0700 In: 240 [P.O.:240] Out: 250 [Drains:250] Intake/Output from this shift: Total I/O In: 240 [P.O.:240] Out: 100 [Drains:100]  Physical Exam: Neck: no adenopathy, no carotid bruit, no JVD and supple, symmetrical, trachea midline Lungs: clear to auscultation bilaterally Heart: irregularly irregular rhythm, S1, S2 normal and soft systolic murmur noted Abdomen: soft, non-tender; bowel sounds normal; no masses,  no organomegaly Extremities: extremities normal, atraumatic, no cyanosis or edema  Lab Results:  Recent Labs  06/02/14 0320 06/03/14 0436  WBC 7.6 7.2  HGB 9.6* 9.8*  PLT 215 251    Recent Labs  06/03/14 0436  NA 137  K 3.3*  CL 108  CO2 23  GLUCOSE 101*  BUN 14  CREATININE 0.62   No results for input(s): TROPONINI in the last 72 hours.  Invalid input(s): CK, MB Hepatic Function Panel No results for input(s): PROT, ALBUMIN, AST, ALT, ALKPHOS, BILITOT, BILIDIR, IBILI in the last 72 hours. No results for input(s): CHOL in the last 72 hours. No results for input(s): PROTIME in the last 72 hours.  Imaging: Imaging results have been reviewed and No results found.  Cardiac Studies:  Assessment/Plan:  Cholecystitis/cholelithiasis status post percutaneous cholecystostomy New-onset A. fib with controlled ventricular response Compensated CHF secondary to preserved LV systolic function History of non-Q-wave MI in the past secondary to demand ischemia Mildly elevated troponin  I secondary to demand ischemia In an doubt significant MI as patient has similar presentation in the past and noted to have mild CAD. History of recurrent syncope in the past status post loop recorder hypertension Hypercholesteremia Osteoarthritis Anemia GERD Dementia  history of questionable seizure disorder Hypokalemia Plan Replace K Check labs in a.m. Add low-dose digoxin  LOS: 7 days    [name] 06/03/2014, 3:49 PM

## 2014-06-03 NOTE — Progress Notes (Signed)
ANTICOAGULATION CONSULT NOTE - Follow Up Consult  Pharmacy Consult for Coumadin Indication: atrial fibrillation  Allergies  Allergen Reactions  . Codeine Other (See Comments)    UNKNOWN; Per MAR    Patient Measurements: Height: 5\' 4"  (162.6 cm) Weight: 99 lb 3.3 oz (45 kg) IBW/kg (Calculated) : 54.7  Vital Signs: Temp: 97.8 F (36.6 C) (01/04 0331) Temp Source: Oral (01/04 0331) BP: 130/75 mmHg (01/04 0517) Pulse Rate: 103 (01/04 0331)  Labs:  Recent Labs  06/01/14 0234 06/02/14 0320 06/03/14 0436  HGB 9.5* 9.6* 9.8*  HCT 29.3* 29.0* 29.8*  PLT 231 215 251  LABPROT 18.1* 23.5* 29.2*  INR 1.48 2.07* 2.73*  HEPARINUNFRC 0.30 0.19*  --   CREATININE  --   --  0.62    Estimated Creatinine Clearance: 38.5 mL/min (by C-G formula based on Cr of 0.62).   Medications:  Scheduled:  . antiseptic oral rinse  7 mL Mouth Rinse BID  . aspirin EC  81 mg Oral Daily  . diltiazem  30 mg Oral 4 times per day  . feeding supplement (RESOURCE BREEZE)  1 Container Oral BID BM  . pantoprazole (PROTONIX) IV  40 mg Intravenous Q24H  . piperacillin-tazobactam (ZOSYN)  IV  3.375 g Intravenous 3 times per day  . Warfarin - Pharmacist Dosing Inpatient   Does not apply q1800    Assessment: 81 yo F new start for Coumadin for afib.  INR has been therapeutic x 2 days with relatively big increases over the last 48 hours.  Will further decrease the Coumadin dose tonight.  Goal of Therapy:  INR 2-3 Monitor platelets by anticoagulation protocol: Yes   Plan:  Coumadin 1mg  PO x 1 tonight. Continue daily INR.  [employer], Pharm.D., BCPS Clinical Pharmacist Pager [phone] 06/03/2014 11:48 AM

## 2014-06-03 NOTE — Evaluation (Signed)
Occupational Therapy Evaluation Patient Details Name: [name] MRN: [id number] DOB: [birth year] Today's Date: 06/03/2014    History of Present Illness Pt is an 81 y/o female admitted with Cholecystitis s/p percutaneous drain. Pt came to the ER by family complaining of vague abdominal pain associated with poor appetite diarrhea and vomiting and feeling generalized weakness for last 1 week PTA. Patient was noted to be in A. fib with RVR with blood pressure in 90s. CT of the abdomen done showed marked distention of gallbladder with possible ductal obstruction and gallstones and questionable diverticulitis.    Clinical Impression   Pt deconsecrates decline in function and safety with ADLs and ADL mobility with decreased strength, balance and endurance. Pt with hx of cognitive impairments. Pt would benefit form acute OT services to address impairments to increase level of function and safety. Pt's family planning for a SNF d/c for sort term rehab    Follow Up Recommendations  SNF;Supervision/Assistance - 24 hour (TBD at next venue of care)    Equipment Recommendations  Other (comment)    Recommendations for Other Services       Precautions / Restrictions Precautions Precautions: Fall Precaution Comments: Drain present on R side Restrictions Weight Bearing Restrictions: No      Mobility Bed Mobility Overal bed mobility: Needs Assistance;+2 for physical assistance Bed Mobility: Supine to Sit     Supine to sit: Total assist;+2 for physical assistance;HOB elevated     General bed mobility comments: Pt required +2 assist to transition to a seated position at EOB. Bed pad used to assist with scooting.   Transfers Overall transfer level: Needs assistance Equipment used: 2 person hand held assist   Sit to Stand: Max assist;+2 physical assistance Stand pivot transfers: Max assist;+2 physical assistance       General transfer comment: Pt was able to attempt stand, however +2  assist was required to achieve full standing. Pt required increased time and full support to attempt pivotal steps around to the recliner chair.     Balance   Sitting-balance support: Single extremity supported;No upper extremity supported;Feet supported Sitting balance-Leahy Scale: Poor     Standing balance support: Bilateral upper extremity supported;During functional activity Standing balance-Leahy Scale: Zero                              ADL Overall ADL's : Needs assistance/impaired     Grooming: Wash/dry hands;Wash/dry face;Sitting;Minimal assistance;Moderate assistance Grooming Details (indicate cue type and reason): Poor sitting balance Upper Body Bathing: Moderate assistance;Sitting Upper Body Bathing Details (indicate cue type and reason): Poor sitting balance Lower Body Bathing: Maximal assistance Lower Body Bathing Details (indicate cue type and reason): Poor sitting balance Upper Body Dressing : Moderate assistance;Sitting Upper Body Dressing Details (indicate cue type and reason): Poor sitting balance Lower Body Dressing: Total assistance Lower Body Dressing Details (indicate cue type and reason): Poor sitting balance Toilet Transfer: +2 for physical assistance;Cueing for sequencing;Maximal assistance   Toileting- Clothing Manipulation and Hygiene: Total assistance;+2 for physical assistance Toileting - Clothing Manipulation Details (indicate cue type and reason):  rolling     Functional mobility during ADLs: +2 for physical assistance;Maximal assistance       Vision  wears reading glasses, hx of cataracts                   Perception Perception Perception Tested?: No   Praxis Praxis Praxis tested?: Not tested  Pertinent Vitals/Pain Pain Assessment: No/denies pain     Hand Dominance Right   Extremity/Trunk Assessment Upper Extremity Assessment Upper Extremity Assessment: Generalized weakness   Lower Extremity Assessment Lower  Extremity Assessment: Defer to PT evaluation   Cervical / Trunk Assessment Cervical / Trunk Assessment: Normal   Communication Communication Communication: No difficulties   Cognition Arousal/Alertness: Awake/alert Behavior During Therapy: WFL for tasks assessed/performed;Flat affect Overall Cognitive Status: History of cognitive impairments - at baseline                     General Comments   Pt/family pleasant                 Home Living Family/patient expects to be discharged to:: Skilled nursing facility Living Arrangements: Other relatives                           Home Equipment: [employer] - single point          Prior Functioning/Environment Level of Independence: Needs assistance  Gait / Transfers Assistance Needed: Pt used cane all the time ADL's / Homemaking Assistance Needed: Daughter assisted pt with a bath, and laid clothes out for her. Pt was able to dress herself.         OT Diagnosis: Generalized weakness   OT Problem List: Decreased strength;Decreased activity tolerance;Impaired balance (sitting and/or standing);Decreased safety awareness;Decreased cognition   OT Treatment/Interventions: Self-care/ADL training;Therapeutic exercise;Patient/family education;Therapeutic activities;Neuromuscular education;Balance training    OT Goals(Current goals can be found in the care plan section) Acute Rehab OT Goals Patient Stated Goal: D/C to rehab before going home.  OT Goal Formulation: With patient/family Time For Goal Achievement: 06/10/14 Potential to Achieve Goals: Good ADL Goals Pt Will Perform Grooming: with min assist;with min guard assist;sitting Pt Will Perform Upper Body Bathing: with min assist;sitting Pt Will Perform Lower Body Bathing: with mod assist;sitting/lateral leans Pt Will Perform Upper Body Dressing: with min assist Pt Will Transfer to Toilet: with max assist;bedside commode  OT Frequency: Min 2X/week   Barriers to  D/C: Decreased caregiver support  pt planning for pt to d/c t SNF for short term rehab                     End of Session    Activity Tolerance: Patient limited by fatigue Patient left: in chair;with call bell/phone within reach;with nursing/sitter in room;with family/visitor present   Time: 8841-6606 OT Time Calculation (min): 25 min Charges:  OT General Charges $OT Visit: 1 Procedure OT Evaluation $Initial OT Evaluation Tier I: 1 Procedure OT Treatments $Therapeutic Activity: 8-22 mins G-Codes:    Galen Manila 06/03/2014, 11:55 AM

## 2014-06-03 NOTE — Clinical Social Work Note (Signed)
Met with patient and her daughter to discuss placements for SNF and bed offers.  Patient and daughter would like to have patient go to [facility].  [facility] gave a bed offer and patient accepted it.  Patient will be discharging to [facility] once she is medically ready and discharge orders have been received.  Jones Broom. Williams, MSW, [place] 06/03/2014 5:33 PM

## 2014-06-03 NOTE — Progress Notes (Signed)
PT Cancellation Note  Patient Details Name: [name] MRN: [id number] DOB: 1930/03/19   Cancelled Treatment:    Reason Eval/Treat Not Completed: Patient at procedure or test/unavailable (pt currently working with OT. Will attempt later as time allows)   Toney Sang [facility] 06/03/2014, 10:57 AM Delaney Meigs, PT [phone]

## 2014-06-03 NOTE — Progress Notes (Signed)
  Subjective: Patient tolerating regular diet, no complaints   Objective: Vital signs in last 24 hours: Temp:  [97.8 F (36.6 C)-97.9 F (36.6 C)] 97.8 F (36.6 C) (01/04 0331) Pulse Rate:  [89-103] 103 (01/04 0331) Resp:  [17-18] 18 (01/04 0331) BP: (105-137)/(71-98) 130/75 mmHg (01/04 0517) SpO2:  [97 %-100 %] 97 % (01/04 0331) Last BM Date: 06/02/14  Intake/Output from previous day: 01/03 0701 - 01/04 0700 In: 240 [P.O.:240] Out: 250 [Drains:250] Intake/Output this shift:    General appearance: alert, cooperative and no distress GI: soft, non-tender; bowel sounds normal; no masses,  no organomegaly Drain site in RUQ - c/d/i; output is cloudy brown  Lab Results:   Recent Labs  06/02/14 0320 06/03/14 0436  WBC 7.6 7.2  HGB 9.6* 9.8*  HCT 29.0* 29.8*  PLT 215 251   BMET  Recent Labs  06/03/14 0436  NA 137  K 3.3*  CL 108  CO2 23  GLUCOSE 101*  BUN 14  CREATININE 0.62  CALCIUM 8.1*   PT/INR  Recent Labs  06/02/14 0320 06/03/14 0436  LABPROT 23.5* 29.2*  INR 2.07* 2.73*   ABG No results for input(s): PHART, HCO3 in the last 72 hours.  Invalid input(s): PCO2, PO2  Studies/Results: No results found.  Anti-infectives: Anti-infectives    Start     Dose/Rate Route Frequency Ordered Stop   05/27/14 2200  piperacillin-tazobactam (ZOSYN) IVPB 3.375 g     3.375 g12.5 mL/hr over 240 Minutes Intravenous 3 times per day 05/27/14 1409     05/27/14 1415  piperacillin-tazobactam (ZOSYN) IVPB 3.375 g     3.375 g100 mL/hr over 30 Minutes Intravenous  Once 05/27/14 1409 05/27/14 1456      Assessment/Plan: s/p * No surgery found * Percutaneous cholecystostomy for acute cholecystitis - improving  OK for discharge from our standpoint, once other medical issues resolved. Follow-up with Dr. Violeta Gelinas in 2-3 weeks after discharge.   LOS: 7 days    Isador Castille K. 06/03/2014

## 2014-06-03 NOTE — Progress Notes (Signed)
Utilization review completed.  

## 2014-06-04 LAB — CBC
HEMATOCRIT: 31.7 % — AB (ref 36.0–46.0)
HEMOGLOBIN: 10.2 g/dL — AB (ref 12.0–15.0)
MCH: 22.5 pg — ABNORMAL LOW (ref 26.0–34.0)
MCHC: 32.2 g/dL (ref 30.0–36.0)
MCV: 70 fL — ABNORMAL LOW (ref 78.0–100.0)
Platelets: 298 10*3/uL (ref 150–400)
RBC: 4.53 MIL/uL (ref 3.87–5.11)
RDW: 22.5 % — ABNORMAL HIGH (ref 11.5–15.5)
WBC: 9.1 10*3/uL (ref 4.0–10.5)

## 2014-06-04 LAB — PROTIME-INR
INR: 3.14 — AB (ref 0.00–1.49)
Prothrombin Time: 32.5 seconds — ABNORMAL HIGH (ref 11.6–15.2)

## 2014-06-04 MED ORDER — POTASSIUM CHLORIDE ER 10 MEQ PO TBCR
10.0000 meq | EXTENDED_RELEASE_TABLET | Freq: Two times a day (BID) | ORAL | Status: DC
  Administered 2014-06-04 – 2014-06-05 (×2): 10 meq via ORAL
  Filled 2014-06-04 (×3): qty 1

## 2014-06-04 MED ORDER — FUROSEMIDE 10 MG/ML IJ SOLN
40.0000 mg | Freq: Once | INTRAMUSCULAR | Status: AC
  Administered 2014-06-04: 40 mg via INTRAVENOUS
  Filled 2014-06-04: qty 4

## 2014-06-04 MED ORDER — DIGOXIN 0.25 MG/ML IJ SOLN
0.2500 mg | Freq: Once | INTRAMUSCULAR | Status: AC
  Administered 2014-06-04: 0.25 mg via INTRAVENOUS
  Filled 2014-06-04: qty 1

## 2014-06-04 MED ORDER — FUROSEMIDE 20 MG PO TABS
20.0000 mg | ORAL_TABLET | Freq: Every day | ORAL | Status: DC
  Administered 2014-06-04 – 2014-06-05 (×2): 20 mg via ORAL
  Filled 2014-06-04 (×3): qty 1

## 2014-06-04 MED ORDER — DILTIAZEM HCL 30 MG PO TABS
30.0000 mg | ORAL_TABLET | Freq: Once | ORAL | Status: AC
  Administered 2014-06-04: 30 mg via ORAL
  Filled 2014-06-04: qty 1

## 2014-06-04 MED ORDER — LEVALBUTEROL HCL 1.25 MG/0.5ML IN NEBU
1.2500 mg | INHALATION_SOLUTION | Freq: Four times a day (QID) | RESPIRATORY_TRACT | Status: DC | PRN
  Filled 2014-06-04: qty 0.5

## 2014-06-04 MED ORDER — POTASSIUM CHLORIDE CRYS ER 20 MEQ PO TBCR
40.0000 meq | EXTENDED_RELEASE_TABLET | Freq: Once | ORAL | Status: AC
  Administered 2014-06-04: 40 meq via ORAL
  Filled 2014-06-04: qty 2

## 2014-06-04 MED ORDER — DILTIAZEM HCL 60 MG PO TABS
60.0000 mg | ORAL_TABLET | Freq: Three times a day (TID) | ORAL | Status: DC
  Administered 2014-06-04 – 2014-06-05 (×5): 60 mg via ORAL
  Filled 2014-06-04 (×7): qty 1

## 2014-06-04 NOTE — Progress Notes (Signed)
NUTRITION FOLLOW-UP  DOCUMENTATION CODES Per approved criteria  -Severe malnutrition in the context of acute illness or injury -Underweight   INTERVENTION: Resource Breeze po BID, each supplement provides 250 kcal and 9 grams of protein   Add magic cups to all meal trays.   NUTRITION DIAGNOSIS: Inadequate oral intake related to anorexia as evidenced by meal completion <25%; ongoing.   Monitor:  Pt to meet >/= 90% of their estimated nutrition needs; not met.    ASSESSMENT: Pt admitted with abdominal pain, feeling weak and tired with poor appetite.  Patient with nausea, vomiting, diarrhea for the past 1 week found to have acute cholecystitis.  Patient is planning to have percutaneuos gallbladder drain placed this AM. Poor candidate for cholecystectomy.   PO intake remains poor.  Plan for transfer to SNF 1/6.   Height: Ht Readings from Last 1 Encounters:  05/28/14 $RemoveB'5\' 4"'bIxEeDRo$  (1.626 m)    Weight: Wt Readings from Last 1 Encounters:  05/27/14 99 lb 3.3 oz (45 kg)    BMI:  Body mass index is 17.02 kg/(m^2).  Estimated Nutritional Needs: Kcal: 1400-1600 Protein: 60-75g Fluid: >1.5 L/day  Skin: intact  Diet Order: Diet Heart Meal Completion: <25%   Intake/Output Summary (Last 24 hours) at 06/04/14 1259 Last data filed at 06/04/14 0730  Gross per 24 hour  Intake    710 ml  Output    250 ml  Net    460 ml    Last BM: 1/3  Labs:   Recent Labs Lab 05/29/14 0301 06/03/14 0436  NA 138 137  K 3.6 3.3*  CL 111 108  CO2 18* 23  BUN 43* 14  CREATININE 1.11* 0.62  CALCIUM 7.7* 8.1*  MG  --  2.2  GLUCOSE 96 101*    CBG (last 3)  No results for input(s): GLUCAP in the last 72 hours.  Scheduled Meds: . antiseptic oral rinse  7 mL Mouth Rinse BID  . aspirin EC  81 mg Oral Daily  . digoxin  0.125 mg Intravenous Daily  . diltiazem  60 mg Oral 3 times per day  . feeding supplement (RESOURCE BREEZE)  1 Container Oral BID BM  . furosemide  20 mg Oral Daily  .  pantoprazole  40 mg Oral Daily  . piperacillin-tazobactam (ZOSYN)  IV  3.375 g Intravenous 3 times per day  . potassium chloride  10 mEq Oral BID  . potassium chloride  40 mEq Oral Once  . Warfarin - Pharmacist Dosing Inpatient   Does not apply q1800    Continuous Infusions: . sodium chloride 50 mL/hr at 06/02/14 [place], [place], [place] Pager [phone] After Hours Pager

## 2014-06-04 NOTE — Progress Notes (Signed)
Subjective:  Patient had A. fib with RVR last night associated with the shortness of breath requiring IV Lasix and Cardizem with improvement in her symptoms states she feels weak and tired today and family requesting 1 more day prior to transfer to [facility]. Patient presently denies any chest pain shortness of breath.  Objective:  Vital Signs in the last 24 hours: Temp:  [97.5 F (36.4 C)-97.9 F (36.6 C)] 97.7 F (36.5 C) (01/05 0508) Pulse Rate:  [72-126] 72 (01/05 1100) Resp:  [20-26] 22 (01/05 0508) BP: (116-156)/(73-111) 120/73 mmHg (01/05 1100) SpO2:  [96 %-100 %] 100 % (01/05 1100)  Intake/Output from previous day: 01/04 0701 - 01/05 0700 In: 830 [P.O.:480; IV Piggyback:350] Out: 250 [Drains:250] Intake/Output from this shift: Total I/O In: 120 [P.O.:120] Out: -   Physical Exam: Neck: no adenopathy, no carotid bruit, no JVD and supple, symmetrical, trachea midline Lungs: Decreased breath sound at bases Heart: irregularly irregular rhythm, S1, S2 normal and Soft systolic murmur noted Abdomen: soft, non-tender; bowel sounds normal; no masses,  no organomegaly Extremities: extremities normal, atraumatic, no cyanosis or edema  Lab Results:  Recent Labs  06/03/14 0436 06/04/14 0554  WBC 7.2 9.1  HGB 9.8* 10.2*  PLT 251 298    Recent Labs  06/03/14 0436  NA 137  K 3.3*  CL 108  CO2 23  GLUCOSE 101*  BUN 14  CREATININE 0.62   No results for input(s): TROPONINI in the last 72 hours.  Invalid input(s): CK, MB Hepatic Function Panel No results for input(s): PROT, ALBUMIN, AST, ALT, ALKPHOS, BILITOT, BILIDIR, IBILI in the last 72 hours. No results for input(s): CHOL in the last 72 hours. No results for input(s): PROTIME in the last 72 hours.  Imaging: Imaging results have been reviewed and No results found.  Cardiac Studies:  Assessment/Plan:  Cholecystitis/cholelithiasis status post percutaneous cholecystostomy New-onset A. fib with controlled  ventricular response Compensated CHF secondary to preserved LV systolic function History of non-Q-wave MI in the past secondary to demand ischemia Mildly elevated troponin I secondary to demand ischemia In an doubt significant MI as patient has similar presentation in the past and noted to have mild CAD. History of recurrent syncope in the past status post loop recorder hypertension Hypercholesteremia Osteoarthritis Anemia GERD Dementia  history of questionable seizure disorder Hypokalemia Plan Add low-dose Lasix and potassium as per orders  LOS: 8 days    Xxavier Noon N 06/04/2014, 11:13 AM

## 2014-06-04 NOTE — Progress Notes (Signed)
Patient ID: [name], female   DOB: 27-Sep-1929, 81 y.o.   MRN: [id number]    Subjective: Pt had SOB overnight.  Given lasix.  Abdominal pain improved with drain.  Eating some  Objective: Vital signs in last 24 hours: Temp:  [97.5 F (36.4 C)-97.9 F (36.6 C)] 97.7 F (36.5 C) (01/05 0508) Pulse Rate:  [91-126] 93 (01/05 0508) Resp:  [20-26] 22 (01/05 0508) BP: (116-156)/(82-111) 143/90 mmHg (01/05 0508) SpO2:  [96 %-99 %] 97 % (01/05 0508) Last BM Date: 06/02/13  Intake/Output from previous day: 01/04 0701 - 01/05 0700 In: 830 [P.O.:480; IV Piggyback:350] Out: 250 [Drains:250] Intake/Output this shift:    PE: Abd: soft, NT, drain in place with bilious clear output, +BS  Lab Results:   Recent Labs  06/03/14 0436 06/04/14 0554  WBC 7.2 9.1  HGB 9.8* 10.2*  HCT 29.8* 31.7*  PLT 251 298   BMET  Recent Labs  06/03/14 0436  NA 137  K 3.3*  CL 108  CO2 23  GLUCOSE 101*  BUN 14  CREATININE 0.62  CALCIUM 8.1*   PT/INR  Recent Labs  06/03/14 0436 06/04/14 0554  LABPROT 29.2* 32.5*  INR 2.73* 3.14*   CMP     Component Value Date/Time   NA 137 06/03/2014 0436   K 3.3* 06/03/2014 0436   CL 108 06/03/2014 0436   CO2 23 06/03/2014 0436   GLUCOSE 101* 06/03/2014 0436   BUN 14 06/03/2014 0436   CREATININE 0.62 06/03/2014 0436   CALCIUM 8.1* 06/03/2014 0436   PROT 6.3 05/29/2014 0301   ALBUMIN 2.6* 05/29/2014 0301   AST 125* 05/29/2014 0301   ALT 269* 05/29/2014 0301   ALKPHOS 87 05/29/2014 0301   BILITOT 1.2 05/29/2014 0301   GFRNONAA 82* 06/03/2014 0436   GFRAA >90 06/03/2014 0436   Lipase     Component Value Date/Time   LIPASE 59 05/28/2014 0028       Studies/Results: No results found.  Anti-infectives: Anti-infectives    Start     Dose/Rate Route Frequency Ordered Stop   06/03/14 2000  ciprofloxacin (CIPRO) tablet 250 mg     250 mg Oral 2 times daily 06/03/14 1552     06/03/14 1730  metroNIDAZOLE (FLAGYL) tablet 500 mg     500  mg Oral 3 times daily 06/03/14 1552     05/27/14 2200  piperacillin-tazobactam (ZOSYN) IVPB 3.375 g     3.375 g12.5 mL/hr over 240 Minutes Intravenous 3 times per day 05/27/14 1409     05/27/14 1415  piperacillin-tazobactam (ZOSYN) IVPB 3.375 g     3.375 g100 mL/hr over 30 Minutes Intravenous  Once 05/27/14 1409 05/27/14 1456       Assessment/Plan  1. Acute cholecystitis, s/p perc chole drain 2. Multiple medical problems  Plan: 1. Patient is surgically stable for dc home.  She may follow up with Dr. Janee Morn in 3 weeks in our office. 2. No further abx therapy needed.  Will dc all abx, C/F   LOS: 8 days    Hilja Kintzel E 06/04/2014, 8:27 AM Pager: [phone]

## 2014-06-04 NOTE — Progress Notes (Signed)
Pt had 41 beat run Vtach, pt sleeping, VSS per flowsheet.  MD on call notified. No new orders received.  Will continue to monitor closely.  Pt resting with call bell in reach.

## 2014-06-04 NOTE — Progress Notes (Signed)
ANTICOAGULATION CONSULT NOTE - Follow Up Consult  Pharmacy Consult for Coumadin Indication: atrial fibrillation  Allergies  Allergen Reactions  . Codeine Other (See Comments)    UNKNOWN; Per [facility]    Patient Measurements: Height: 5\' 4"  (162.6 cm) Weight: 99 lb 3.3 oz (45 kg) IBW/kg (Calculated) : 54.7  Vital Signs: Temp: 97.7 F (36.5 C) (01/05 0508) Temp Source: Oral (01/05 0508) BP: 143/90 mmHg (01/05 0508) Pulse Rate: 93 (01/05 0508)  Labs:  Recent Labs  06/02/14 0320 06/03/14 0436 06/04/14 0554  HGB 9.6* 9.8* 10.2*  HCT 29.0* 29.8* 31.7*  PLT 215 251 298  LABPROT 23.5* 29.2* 32.5*  INR 2.07* 2.73* 3.14*  HEPARINUNFRC 0.19*  --   --   CREATININE  --  0.62  --     Estimated Creatinine Clearance: 38.5 mL/min (by C-G formula based on Cr of 0.62).   Medications:  Scheduled:  . antiseptic oral rinse  7 mL Mouth Rinse BID  . aspirin EC  81 mg Oral Daily  . digoxin  0.125 mg Intravenous Daily  . diltiazem  60 mg Oral 3 times per day  . feeding supplement (RESOURCE BREEZE)  1 Container Oral BID BM  . pantoprazole  40 mg Oral Daily  . piperacillin-tazobactam (ZOSYN)  IV  3.375 g Intravenous 3 times per day  . Warfarin - Pharmacist Dosing Inpatient   Does not apply q1800    Assessment: 81 yo F new start for Coumadin for afib. INR 3.14 this morning, initially started 5 mg daily, with big increase for the past 3 days. Dose has been decreased appropriately. hgb 10.2, plt 298 stable. No bleeding noted per chart.  Goal of Therapy:  INR 2-3 Monitor platelets by anticoagulation protocol: Yes   Plan:  Hold coumadin today Continue daily INR. Probably will require no more than 2.5mg  daily  Bayard Hugger, PharmD, BCPS  Clinical Pharmacist  Pager: [phone]  06/04/2014 10:37 AM

## 2014-06-04 NOTE — Progress Notes (Signed)
Medicare Important Message given? YES  (If response is "NO", the following Medicare IM given date fields will be blank)  Date Medicare IM given: 06/04/14 Medicare IM given by:  Amity Roes  

## 2014-06-04 NOTE — Progress Notes (Signed)
Physical Therapy Treatment Patient Details Name: [name] MRN: [id number] DOB: [birth year] Today's Date: 06/04/2014    History of Present Illness Pt is an 81 y/o female admitted with Cholecystitis s/p percutaneous drain. Pt came to the ER by family complaining of vague abdominal pain associated with poor appetite diarrhea and vomiting and feeling generalized weakness for last 1 week PTA. Patient was noted to be in A. fib with RVR with blood pressure in 90s. CT of the abdomen done showed marked distention of gallbladder with possible ductal obstruction and gallstones and questionable diverticulitis.     PT Comments    Patient progressing slowly towards physical therapy goals. Fatigued easily after bed mobility and transfer training. Unable to tolerate gait training today. SpO2 down to 87% on room air, improved to 94% with 1L supplemental O2 and pursed lip breathing technique. Patient will continue to benefit from skilled physical therapy services to further improve independence with functional mobility.   Follow Up Recommendations  SNF;Supervision/Assistance - 24 hour     Equipment Recommendations  Rolling walker with 5" wheels    Recommendations for Other Services       Precautions / Restrictions Precautions Precautions: Fall Precaution Comments: Drain present on R side Restrictions Weight Bearing Restrictions: No    Mobility  Bed Mobility Overal bed mobility: Needs Assistance;+2 for physical assistance Bed Mobility: Supine to Sit     Supine to sit: +2 for physical assistance;HOB elevated;Mod assist     General bed mobility comments: Mod assist for truncal support into seated position. Heavy use of bed rail. +2 for truncal support from behind.  Transfers Overall transfer level: Needs assistance Equipment used: 2 person hand held assist Transfers: Sit to/from [employer] Sit to Stand: +2 physical assistance;Mod assist Stand pivot transfers: +2  physical assistance;Min assist       General transfer comment: Mod assist for boost to stand +2 assist for balance. VC for upright posture during pivot transfer to chair with 2 person hand held assist. Needs some assist with descent into chair for control.  Ambulation/Gait                 Stairs            Wheelchair Mobility    Modified Rankin (Stroke Patients Only)       Balance                                    Cognition Arousal/Alertness: Awake/alert Behavior During Therapy: WFL for tasks assessed/performed;Flat affect Overall Cognitive Status: History of cognitive impairments - at baseline                      Exercises General Exercises - Lower Extremity Ankle Circles/Pumps: AROM;Both;10 reps;Seated Quad Sets: Strengthening;Both;15 reps;Supine    General Comments General comments (skin integrity, edema, etc.): Pt became very fatigued during transfer and quite lethargic after sitting. BP 140/73, reporting dizziness which resolved shortly after sitting. RN notified and aware. SpO2 at rest on room air was 89% and during transfer between 87-91%. placed on 1L supplemental O2 and instructed for pursed lip breathing exercise which elevated SpO2 to 94% at rest.      Pertinent Vitals/Pain Pain Assessment: No/denies pain    Home Living                      Prior Function  PT Goals (current goals can now be found in the care plan section) Acute Rehab PT Goals PT Goal Formulation: With patient/family Time For Goal Achievement: 06/14/14 Potential to Achieve Goals: Good Progress towards PT goals: Progressing toward goals    Frequency  Min 2X/week    PT Plan Current plan remains appropriate    Co-evaluation             End of Session Equipment Utilized During Treatment: Gait belt;Oxygen Activity Tolerance: Patient limited by fatigue Patient left: in chair;with call bell/phone within reach     Time:  1502-1525 PT Time Calculation (min) (ACUTE ONLY): 23 min  Charges:  $Therapeutic Activity: 23-37 mins                    G Codes:      Berton Mount 2014/07/02, 4:42 PM Sunday Spillers [place], [place] [phone]

## 2014-06-04 NOTE — Progress Notes (Signed)
Pt HR noted to be sustaining 120s-130s Afib.  Pt SOB, VS per flowsheet.  MD on call notified.  New orders received.  Will carry out orders.  Will continue to monitor closely.  Call bell in reach.

## 2014-06-04 NOTE — Progress Notes (Signed)
Pt still c/o of SOB, lungs wheezy in BL upper lobes.  O2 sat stable, 97% on 2L. MD on call notified.  New orders received.  Will carry out orders and continue to monitor pt.  Call bell in reach.

## 2014-06-04 NOTE — Clinical Documentation Improvement (Signed)
Renal labs as below.  Please identify any clinical conditions associated with the abnormal labs and document in your progress note and carry over to the discharge summary.    Component      BUN Creatinine  Latest Ref Rng      6 - 23 mg/dL 9.74 - 1.63 mg/dL  84/53/6468     03:21 AM 49 (H) 1.14 (H)  05/27/2014     8:19 PM 52 (H) 1.18 (H)  05/28/2014     12:28 AM 52 (H) 1.22 (H)  05/29/2014      43 (H) 1.11 (H)  06/03/2014      14 0.62   Component      GFR calc Af Amer  Latest Ref Rng      >90 mL/min  05/27/2014     10:12 AM 50 (L)  05/27/2014     8:19 PM 48 (L)  05/28/2014     12:28 AM 46 (L)  05/29/2014      52 (L)  06/03/2014      >90    Possible Clinical Conditons: -Acute kidney injury / acute renal failure -Other condition (please specify) -Unable to determine at present  Thank you, Doy Mince, RN [phone] Clinical Documentation Specialist

## 2014-06-05 LAB — BASIC METABOLIC PANEL
ANION GAP: 10 (ref 5–15)
BUN: 13 mg/dL (ref 6–23)
CALCIUM: 8.4 mg/dL (ref 8.4–10.5)
CO2: 24 mmol/L (ref 19–32)
Chloride: 106 mEq/L (ref 96–112)
Creatinine, Ser: 0.75 mg/dL (ref 0.50–1.10)
GFR calc Af Amer: 89 mL/min — ABNORMAL LOW (ref >90)
GFR, EST NON AFRICAN AMERICAN: 77 mL/min — AB (ref >90)
Glucose, Bld: 90 mg/dL (ref 70–99)
Potassium: 4.1 mmol/L (ref 3.5–5.1)
Sodium: 140 mmol/L (ref 135–145)

## 2014-06-05 LAB — CBC
HCT: 32.6 % — ABNORMAL LOW (ref 36.0–46.0)
Hemoglobin: 10.6 g/dL — ABNORMAL LOW (ref 12.0–15.0)
MCH: 22.5 pg — AB (ref 26.0–34.0)
MCHC: 32.5 g/dL (ref 30.0–36.0)
MCV: 69.2 fL — AB (ref 78.0–100.0)
PLATELETS: ADEQUATE 10*3/uL (ref 150–400)
RBC: 4.71 MIL/uL (ref 3.87–5.11)
RDW: 21.7 % — ABNORMAL HIGH (ref 11.5–15.5)
WBC: 8.1 10*3/uL (ref 4.0–10.5)

## 2014-06-05 LAB — PROTIME-INR
INR: 3.16 — ABNORMAL HIGH (ref 0.00–1.49)
Prothrombin Time: 32.7 seconds — ABNORMAL HIGH (ref 11.6–15.2)

## 2014-06-05 MED ORDER — AMIODARONE HCL 200 MG PO TABS
200.0000 mg | ORAL_TABLET | Freq: Every day | ORAL | Status: AC
Start: 2014-06-05 — End: ?

## 2014-06-05 MED ORDER — WARFARIN SODIUM 2 MG PO TABS: 2.0000 mg | ORAL_TABLET | Freq: Every day | ORAL | Status: DC

## 2014-06-05 MED ORDER — METOPROLOL SUCCINATE ER 50 MG PO TB24: 50.0000 mg | ORAL_TABLET | Freq: Every day | ORAL | Status: DC

## 2014-06-05 NOTE — Clinical Social Work Note (Signed)
Patient to be d/c'ed today to [facility].  Patient and family agreeable to plans will transport via ems RN to call report. Patient's daughter Clide Cliff made aware that patient will be discharging today, [facility] called for transport.  Windell Moulding, MSW, [name] [phone]

## 2014-06-05 NOTE — Progress Notes (Signed)
Report given to [facility]. 

## 2014-06-05 NOTE — Discharge Summary (Signed)
[name], STEGNER NO.:  [id number]  MEDICAL RECORD NO.:  [id number]  LOCATION:  2W12C                        FACILITY:  [facility]  PHYSICIAN:  Eduardo Osier. Sharyn Lull, M.D. DATE OF BIRTH:  05-21-1930  DATE OF ADMISSION:  05/27/2014 DATE OF DISCHARGE:  06/05/2014                              DISCHARGE SUMMARY   DISPOSITION:  Discharged to skilled nursing facility.  ADMITTING DIAGNOSES: 1. New onset atrial fibrillation with rapid ventricular rate. 2. Acute cholecystitis/rule out cholangitis. 3. Compensated congestive heart failure secondary to preserved left     ventricular systolic function. 4. History of non-Q-wave myocardial infarction in the past secondary     to demand ischemia. 5. Mildly elevated troponin I secondary to demand ischemia, doubt     significant MI as the patient had similar presentation and passed     and was noted to have mild coronary artery disease, history of     recurrent syncope in the past, status post loop recorder. 6. Hypertension. 7. Hypercholesteremia. 8. Osteoarthritis. 9. Anemia. 10.Gastroesophageal reflux disease. 11.Dementia. 12.History of questionable seizure disorder.  FINAL DIAGNOSES: 1. Status post acute cholecystitis. 2. Status post percutaneous drainage of the gallbladder. 3. New onset atrial fibrillation with now controlled ventricular     response. 4. Status post nonsustained ventricular tachycardia asymptomatic. 5. Compensated CHF secondary to preserved LV systolic function. 6. History of non-Q-wave myocardial infarction in the past secondary     to demand ischemia. 7. Mildly elevated troponin I, secondary to demand ischemia in the     past. 8. Mild coronary artery disease. 9. History of recurrent syncope in the past, status post loop     recorder. 10.Hypertension. 11.Hypercholesteremia. 12.Osteoarthritis. 13.Anemia. 14.Gastroesophageal reflux disease. 15.Dementia. 16.History of questionable seizure  disorder. 17.Status post prerenal azotemia. 18.Status post hypokalemia.  DISCHARGE MEDICATIONS: 1. Amiodarone 200 mg one tablet daily. 2. Metoprolol succinate 50 mg one tablet daily. 3. Warfarin 2 mg daily. 4. Aspirin 81 mg daily. 5. Ensure 1 can twice daily. 6. Ferrous sulfate 1 tablet twice daily. 7. Lisinopril 5 mg daily. 8. Omeprazole 20 mg daily. 9. MiraLAX 17 g daily. 10.Simvastatin 20 mg daily.  DIET:  Low salt, low cholesterol.  ACTIVITY:  As tolerated.  FOLLOWUP:  Follow up with me in 1 week.  Follow up with Dr. Violeta Gelinas, [facility] in 3 weeks.  Check PT/INR, CBC in 1 week.  CONDITION AT DISCHARGE:  Stable.  The patient will be transferred to [facility].  BRIEF HISTORY AND HOSPITAL COURSE:  The patient is an 81 year old female with past medical history significant for multiple medical problems, i.e., mild coronary artery disease, history of non-Q-wave myocardial infarction in the past, type 2 probably secondary to demand ischemia, hypertension, history of recurrent syncope in the past, status post loop recorder in the past, negative workup so far, GERD, chronic anemia, degenerative joint disease, questionable seizure disorder, dementia, history of small bowel obstruction, status post exploratory laparotomy/obturator hernia repair in the past.  She came to the ER by family complaining of vague abdominal pain associated with poor appetite, diarrhea, vomiting, and feeling generalized weak for last 1 week.  The patient was noted to be in AFib with RVR  with blood pressure in 90s.  CT of the abdomen done showed mild distention of gallbladder with possible ductal obstruction and gallstones and questionable diverticulitis.  The patient denies any chest pain.  Denies palpitation or syncopal episode.  Denies fever or chills.  PHYSICAL EXAMINATION:  GENERAL:  She was alert, awake, oriented. VITAL SIGNS:  Blood pressure was 190/66, pulse 124  irregularly irregular.  Temperature was 99.1. HEENT:  Conjunctiva pink. NECK:  Supple.  No JVD.  No bruit. CARDIOVASCULAR:  Tachycardic, irregularly irregular.  S1, S2 was soft. There was soft systolic murmur. LUNGS:  Decreased breath sounds at bases. ABDOMEN:  Soft.  There was mild right upper quadrant tenderness.  No guarding. NEUROLOGIC:  Grossly intact.  LABORATORY DATA:  Sodium was 141, potassium 3.3, BUN 49, creatinine 1.14, troponin I was 1.30, repeat troponin I was 1.42. Troponin was 1.19, which is trending down.  Her hemoglobin was 12.8, hematocrit 38.7, white count of 8.8 with left shift.  CT of the abdomen showed marked distention of the gallbladder. Ultrasound marked distention of gallbladder, hepatic congestion with diffusely mottled appearance of the liver parenchyma, bilateral adrenal nodules, significant diverticulosis, questionable diverticulitis, cardiomegaly, mitral anulus and coronary calcification.  Chest x-ray showed no acute findings.  Abdominal ultrasound showed distended gallbladder with sludge, cholelithiasis, no superimposed finding of acute cholecystitis.  BRIEF HOSPITAL COURSE:  The patient was admitted to step-down unit.  GI and surgical consultation was obtained and subsequently, Interventional Radiology consultation was obtained for percutaneous drainage of gallbladder.  The patient was started on IV Cardizem which was switched to p.o.  Cardizem with control of her heart rate.  The patient was started on broad-spectrum antibiotics, which she is off for last 24 hours.  The patient remained afebrile during the hospital stay.  The patient remained in AFib with controlled ventricular response with occasional episodes of nonsustained VT, asymptomatic.  The patient also was started on IV heparin and Coumadin, which has been discontinued. Her INR is in therapeutic range.  We will switch her Cardizem to beta- blockers and also add low-dose amiodarone in  view of recurrent nonsustained VT.  Will reduce the dose of Coumadin to 2 mg daily.  We will follow her PT/INR next week and adjust the Coumadin dose as outpatient.  The patient will follow up closely with Surgery and possible cholecystectomy in the next few weeks and if remained stable. The patient has a percutaneous gallbladder drainage tube, which will discontinued in 6-8 weeks.     Eduardo Osier. Sharyn Lull, M.D.     [facility]  D:  06/05/2014  T:  06/05/2014  Job:  [id number]

## 2014-06-05 NOTE — Progress Notes (Signed)
ANTICOAGULATION CONSULT NOTE - Follow Up Consult  Pharmacy Consult for Coumadin Indication: atrial fibrillation  Allergies  Allergen Reactions  . Codeine Other (See Comments)    UNKNOWN; Per [facility]    Patient Measurements: Height: 5\' 4"  (162.6 cm) Weight: 99 lb 3.3 oz (45 kg) IBW/kg (Calculated) : 54.7  Vital Signs: Temp: 97.3 F (36.3 C) (01/06 0508) Temp Source: Oral (01/06 0508) BP: 144/90 mmHg (01/06 0508) Pulse Rate: 89 (01/06 0508)  Labs:  Recent Labs  06/03/14 0436 06/04/14 0554 06/05/14 0454  HGB 9.8* 10.2* 10.6*  HCT 29.8* 31.7* 32.6*  PLT 251 298 PLATELET CLUMPS NOTED ON SMEAR, COUNT APPEARS ADEQUATE  LABPROT 29.2* 32.5* 32.7*  INR 2.73* 3.14* 3.16*  CREATININE 0.62  --  0.75    Estimated Creatinine Clearance: 38.5 mL/min (by C-G formula based on Cr of 0.75).   Medications:  Scheduled:  . antiseptic oral rinse  7 mL Mouth Rinse BID  . aspirin EC  81 mg Oral Daily  . digoxin  0.125 mg Intravenous Daily  . diltiazem  60 mg Oral 3 times per day  . feeding supplement (RESOURCE BREEZE)  1 Container Oral BID BM  . furosemide  20 mg Oral Daily  . pantoprazole  40 mg Oral Daily  . potassium chloride  10 mEq Oral BID  . Warfarin - Pharmacist Dosing Inpatient   Does not apply q1800    Assessment: 81 yo F new start for Coumadin for afib. INR 3.16 this morning, initially started 5 mg daily, with big increase for the past 3 days. Dose has been decreased/held appropriately. hgb 10.6, plt 298 stable. No bleeding noted per chart.  Goal of Therapy:  INR 2-3 Monitor platelets by anticoagulation protocol: Yes   Plan:  Hold coumadin today Continue daily INR. Probably will require no more than 2 mg daily  Bayard Hugger, PharmD, BCPS  Clinical Pharmacist  Pager: [phone]  06/05/2014 1:06 PM

## 2014-06-05 NOTE — Discharge Summary (Signed)
Prior to discharge summary dictated on 06/05/2014 dictation number is [id number]

## 2014-06-05 NOTE — Progress Notes (Signed)
Patient ID: [name], female   DOB: [birth year], 81 y.o.   MRN: [id number]    Subjective: Pt is sleeping.  Had a 41 beat run of Vtach last night while sleeping.    Objective: Vital signs in last 24 hours: Temp:  [97.3 F (36.3 C)-97.9 F (36.6 C)] 97.3 F (36.3 C) (01/06 0508) Pulse Rate:  [72-98] 89 (01/06 0508) Resp:  [21-23] 23 (01/06 0508) BP: (120-144)/(73-90) 144/90 mmHg (01/06 0508) SpO2:  [96 %-100 %] 99 % (01/06 0508) Last BM Date: 06/04/13  Intake/Output from previous day: 01/05 0701 - 01/06 0700 In: 3977.3 [P.O.:360; I.V.:3617.3] Out: 80 [Drains:80] Intake/Output this shift:    PE: Abd: soft, NT, drain in place with bilious output  Lab Results:   Recent Labs  06/03/14 0436 06/04/14 0554  WBC 7.2 9.1  HGB 9.8* 10.2*  HCT 29.8* 31.7*  PLT 251 298   BMET  Recent Labs  06/03/14 0436  NA 137  K 3.3*  CL 108  CO2 23  GLUCOSE 101*  BUN 14  CREATININE 0.62  CALCIUM 8.1*   PT/INR  Recent Labs  06/03/14 0436 06/04/14 0554  LABPROT 29.2* 32.5*  INR 2.73* 3.14*   CMP     Component Value Date/Time   NA 137 06/03/2014 0436   K 3.3* 06/03/2014 0436   CL 108 06/03/2014 0436   CO2 23 06/03/2014 0436   GLUCOSE 101* 06/03/2014 0436   BUN 14 06/03/2014 0436   CREATININE 0.62 06/03/2014 0436   CALCIUM 8.1* 06/03/2014 0436   PROT 6.3 05/29/2014 0301   ALBUMIN 2.6* 05/29/2014 0301   AST 125* 05/29/2014 0301   ALT 269* 05/29/2014 0301   ALKPHOS 87 05/29/2014 0301   BILITOT 1.2 05/29/2014 0301   GFRNONAA 82* 06/03/2014 0436   GFRAA >90 06/03/2014 0436   Lipase     Component Value Date/Time   LIPASE 59 05/28/2014 0028       Studies/Results: No results found.  Anti-infectives: Anti-infectives    Start     Dose/Rate Route Frequency Ordered Stop   06/03/14 2000  ciprofloxacin (CIPRO) tablet 250 mg  Status:  Discontinued     250 mg Oral 2 times daily 06/03/14 1552 06/04/14 0830   06/03/14 1730  metroNIDAZOLE (FLAGYL) tablet 500 mg   Status:  Discontinued     500 mg Oral 3 times daily 06/03/14 1552 06/04/14 0830   05/27/14 2200  piperacillin-tazobactam (ZOSYN) IVPB 3.375 g  Status:  Discontinued     3.375 g12.5 mL/hr over 240 Minutes Intravenous 3 times per day 05/27/14 1409 06/04/14 1332   05/27/14 1415  piperacillin-tazobactam (ZOSYN) IVPB 3.375 g     3.375 g100 mL/hr over 30 Minutes Intravenous  Once 05/27/14 1409 05/27/14 1456       Assessment/Plan  1. Acute cholecystitis, s/p perc drain 2. New onset a fib 3. Multiple medical problems  Plan: 1. Patient doing well from surgical standpoint. She is stable for dc to SNF when ok with primary service.  Follow up information is in the computer.  We will sign off.     LOS: 9 days    Evangelyne Loja E 06/05/2014, 7:55 AM Pager: [phone]

## 2014-06-06 ENCOUNTER — Non-Acute Institutional Stay (SKILLED_NURSING_FACILITY): Payer: Medicare Other | Admitting: Internal Medicine

## 2014-06-06 DIAGNOSIS — I4891 Unspecified atrial fibrillation: Secondary | ICD-10-CM

## 2014-06-06 DIAGNOSIS — D509 Iron deficiency anemia, unspecified: Secondary | ICD-10-CM

## 2014-06-06 DIAGNOSIS — I5032 Chronic diastolic (congestive) heart failure: Secondary | ICD-10-CM

## 2014-06-06 DIAGNOSIS — K219 Gastro-esophageal reflux disease without esophagitis: Secondary | ICD-10-CM

## 2014-06-06 DIAGNOSIS — E43 Unspecified severe protein-calorie malnutrition: Secondary | ICD-10-CM

## 2014-06-06 DIAGNOSIS — R5381 Other malaise: Secondary | ICD-10-CM

## 2014-06-06 DIAGNOSIS — K810 Acute cholecystitis: Secondary | ICD-10-CM

## 2014-06-06 NOTE — Progress Notes (Signed)
Patient ID: [name], female   DOB: 1930-04-27, 81 y.o.   MRN: [id number]     [facility]   PCP: Robynn Pane, MD  Code Status: DNR  Allergies  Allergen Reactions  . Codeine Other (See Comments)    UNKNOWN; Per [facility]    Chief Complaint  Patient presents with  . New Admit To SNF     HPI:  81 year old patient is here for short term rehabilitation post hospital admission from 05/27/14-06/05/13 with acute cholecystitis and new onset afib with RVR with demand ischemia. GI and surgery were consulted. She had IR guided percutaneous drainage of gallbladder. she was started on IV Cardizem which was switched to p.o. Cardizem with control of her heart rate.  later it was switched to b blocker and amiodarone. She was on heparin and coumadin and heparin was discontinue with inr being therapeutic. The plan is for her to undergo cholecystectomy in few weeks.   She has PMH of CHF, HTN, HLD, OA, GERD, anemia and dementia. She is seen in her room today. She appears weak and tired but in no distress. She denies abdominal pain. No concern from staff. She denies any other concern besides fatigue.  Review of Systems:  Constitutional: Negative for fever, chills, diaphoresis.  HENT: Negative for headache, congestion Eyes: Negative for eye pain, blurred vision, double vision and discharge.  Respiratory: Negative for cough, shortness of breath and wheezing.   Cardiovascular: Negative for chest pain, palpitations, leg swelling.  Gastrointestinal: Negative for heartburn, nausea, vomiting, abdominal pain. Genitourinary: Negative for dysuria and flank pain.  Musculoskeletal: Negative for back pain, falls Skin: Negative for itching, rash.  Neurological: Negative for dizziness, tingling, focal weakness Psychiatric/Behavioral: Negative for depression, anxiety. Has hx of dementia   Past Medical History  Diagnosis Date  . Syncope   . Atypical chest pain    Nonobstructive catheterization; negative Myoview 2011  . CVA 11/23/2008  . HYPERTENSION 11/23/2008  . Coronary artery disease   . 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 Other primary cardiomyopathies   . Dementia 11/23/2008  . Non Q wave myocardial infarction hx    notes 09/11/2013  . GERD (gastroesophageal reflux disease)   . SEIZURE DISORDER 11/23/2008    "she's had blackouts so loop recorder placed" (09/11/2013)  . Arthritis     "hands, legs, back" (09/11/2013)  . Status post placement of implantable loop recorder    Past Surgical History  Procedure Laterality Date  . Loop recorder implant  ?2010  . Colonoscopy    . Eye surgery    . Cataract extraction w/ intraocular lens  implant, bilateral Bilateral   . Laparotomy  11/30/2011    Procedure: EXPLORATORY LAPAROTOMY;  Surgeon: Liz Malady, MD;  Location: [facility] OR;  Service: General;  Laterality: N/A;  Exploratory Laparotomy with Primary Repair Obturator Hernia  . Cardiac catheterization  10/2011  . Left heart catheterization with coronary angiogram N/A 11/26/2011    Procedure: LEFT HEART CATHETERIZATION WITH CORONARY ANGIOGRAM;  Surgeon: Robynn Pane, MD;  Location: [facility] CATH LAB;  Service: Cardiovascular;  Laterality: N/A;   Social History:   reports that she has quit smoking. Her smoking use included Cigarettes. She smoked 0.00 packs per day. She has never used smokeless tobacco. She reports that she does not drink alcohol or use illicit drugs.  Family History  Problem Relation Age of Onset  . Heart failure Neg Hx   . Stroke    . Heart  disease      Medications: Patient's Medications  New Prescriptions   No medications on file  Previous Medications   AMIODARONE (PACERONE) 200 MG TABLET    Take 1 tablet (200 mg total) by mouth daily.   ASPIRIN EC 81 MG EC TABLET    Take 1 tablet (81 mg total) by mouth daily.   FEEDING SUPPLEMENT (ENSURE COMPLETE) LIQD    Take 237 mLs by mouth 2 (two) times daily between meals.   FERROUS  GLUCONATE (FERGON) 324 MG TABLET    Take 1 tablet (324 mg total) by mouth daily with breakfast.   LISINOPRIL (PRINIVIL,ZESTRIL) 5 MG TABLET    Take 2.5 mg by mouth daily.   METOPROLOL SUCCINATE (TOPROL XL) 50 MG 24 HR TABLET    Take 1 tablet (50 mg total) by mouth daily. Take with or immediately following a meal.   OMEPRAZOLE (PRILOSEC) 20 MG CAPSULE    Take 20 mg by mouth daily.   POLYETHYLENE GLYCOL (MIRALAX / GLYCOLAX) PACKET    Take 17 g by mouth every other day.    SIMVASTATIN (ZOCOR) 20 MG TABLET    Take 20 mg by mouth every evening.   WARFARIN (COUMADIN) 2 MG TABLET    Take 1 tablet (2 mg total) by mouth daily.  Modified Medications   No medications on file  Discontinued Medications   No medications on file     Physical Exam Filed Vitals:   06/06/14 1247  BP: 134/77  Pulse: 84  Temp: 98.3 F (36.8 C)  Resp: 18  SpO2: 97%    General- elderly female, thin and frail, in no acute distress Head- normocephalic, atraumatic Throat- moist mucus membrane Eyes- PERRLA, EOMI, no pallor, no icterus, no discharge, normal conjunctiva, normal sclera Neck- no cervical lymphadenopathy Cardiovascular- irregular heart rate, no murmurs, palpable dorsalis pedis and radial pulses, no leg edema Respiratory- decreased air entry at bases, no wheeze, no rhonchi, no crackles, no use of accessory muscles Abdomen- bowel sounds present, soft, non tender, the drain on right side with suture in place, dressing clean and dry  Musculoskeletal- able to move all 4 extremities, generalized weakness Neurological- no focal deficit Skin- warm and dry Psychiatry- alert and oriented, normal mood and affect    Labs reviewed: Basic Metabolic Panel:  Recent Labs  54/09/81 1820  05/29/14 0301 06/03/14 0436 06/05/14 0454  NA 137  < > 138 137 140  K 4.3  < > 3.6 3.3* 4.1  CL 100  < > 111 108 106  CO2 23  < > 18* 23 24  GLUCOSE 128*  < > 96 101* 90  BUN 42*  < > 43* 14 13  CREATININE 1.14*  < > 1.11*  0.62 0.75  CALCIUM 8.7  < > 7.7* 8.1* 8.4  MG 2.2  --   --  2.2  --   < > = values in this interval not displayed. Liver Function Tests:  Recent Labs  05/27/14 2019 05/28/14 0028 05/29/14 0301  AST 271* 245* 125*  ALT 431* 387* 269*  ALKPHOS 126* 122* 87  BILITOT 1.4* 1.1 1.2  PROT 6.7 6.6 6.3  ALBUMIN 3.0* 2.9* 2.6*    Recent Labs  05/27/14 1039 05/28/14 0028  LIPASE 67* 59   No results for input(s): AMMONIA in the last 8760 hours. CBC:  Recent Labs  09/11/13 1820  05/27/14 1012 05/27/14 2019  06/03/14 0436 06/04/14 0554 06/05/14 0454  WBC 6.2  < > 8.8 6.7  < >  7.2 9.1 8.1  NEUTROABS 4.1  --  7.0 5.1  --   --   --   --   HGB 9.6*  < > 12.8 10.2*  < > 9.8* 10.2* 10.6*  HCT 28.8*  < > 38.7 30.9*  < > 29.8* 31.7* 32.6*  MCV 65.6*  < > 67.4* 69.3*  < > 67.3* 70.0* 69.2*  PLT 236  < > 195 159  < > 251 298 PLATELET CLUMPS NOTED ON SMEAR, COUNT APPEARS ADEQUATE  < > = values in this interval not displayed. Cardiac Enzymes:  Recent Labs  05/27/14 2019 05/28/14 0028 05/28/14 0655  TROPONINI 1.19* 1.08* 0.86*   BNP: Invalid input(s): POCBNP CBG:  Recent Labs  09/11/13 1303  GLUCAP 82    Assessment/Plan  Physical deconditioning Will have her work with physical therapy and occupational therapy team to help with gait training and muscle strengthening exercises.fall precautions. Skin care. Encourage to be out of bed. On o2 to help with her breathing  Acute cholecystitis S/p percutaenous drainage and has biliary drain in place. Continue skin and drain care. Has f/u with surgery in 3 weeks to assess for cholecystectomy. Monitor cbc with diff and cmp. Low fat diet for now  afib Rate controlled at present. Continue amiodarone 200 mg daily, metoprolol succinate 50 mg daily and coumadin 2 mg daily, monitor inr with goal 2-3. Has cardiology f/u in 1 week  CHF Euvolemic, continue lisinopril 2.5 mg daily, metoprolol 50 mg daily and zocor 20 mg daily, monitor  clinically  Protein calorie malnutrition Continue protein supplements  Anemia On ferrous sulfate bid, monitor h&h  gerd Stable on omeprazole 20 mg daily   Goals of care: short term rehabilitation   Labs/tests ordered: cbc with diff, cmp next week  Family/ staff Communication: reviewed care plan with patient and nursing supervisor    Oneal Grout, MD  [facility] Adult [facility] [phone] (Monday-Friday 8 am - 5 pm) [phone] (afterhours)

## 2014-06-24 ENCOUNTER — Encounter: Payer: Self-pay | Admitting: Adult Health

## 2014-06-24 ENCOUNTER — Non-Acute Institutional Stay (SKILLED_NURSING_FACILITY): Payer: Medicare Other | Admitting: Adult Health

## 2014-06-24 DIAGNOSIS — I5032 Chronic diastolic (congestive) heart failure: Secondary | ICD-10-CM

## 2014-06-24 NOTE — Progress Notes (Signed)
Patient ID: [name], female   DOB: 03/05/1930, 81 y.o.   MRN: [id number]   06/24/2014  Facility:  Nursing Home Location:  [facility] Room Number: 1108-P LEVEL OF CARE:  SNF (31)   Chief Complaint  Patient presents with  . Acute Visit    CHF    HISTORY OF PRESENT ILLNESS:  This is an 81 year old female who was noted to have BLE edema 2+. She has medical history of chronic diastolic CHF. No SOB noted.  PAST MEDICAL HISTORY:  Past Medical History  Diagnosis Date  . Syncope   . Atypical chest pain     Nonobstructive catheterization; negative Myoview 2011  . CVA 11/23/2008  . HYPERTENSION 11/23/2008  . Coronary artery disease   . 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 Other primary cardiomyopathies   . Dementia 11/23/2008  . Non Q wave myocardial infarction hx    notes 09/11/2013  . GERD (gastroesophageal reflux disease)   . SEIZURE DISORDER 11/23/2008    "she's had blackouts so loop recorder placed" (09/11/2013)  . Arthritis     "hands, legs, back" (09/11/2013)  . Status post placement of implantable loop recorder     CURRENT MEDICATIONS: Reviewed per MAR/see medication list  Allergies  Allergen Reactions  . Codeine Other (See Comments)    UNKNOWN; Per MAR     REVIEW OF SYSTEMS:  GENERAL: no change in appetite, no fatigue, no weight changes, no fever, chills or weakness RESPIRATORY: no cough, SOB, DOE, wheezing, hemoptysis CARDIAC: no chest pain, or palpitations, +edema GI: no abdominal pain, diarrhea, constipation, heart burn, nausea or vomiting  PHYSICAL EXAMINATION  GENERAL: no acute distress, normal body habitus EYES: conjunctivae normal, sclerae normal, normal eye lids NECK: supple, trachea midline, no neck masses, no thyroid tenderness, no thyromegaly LYMPHATICS: no LAN in the neck, no supraclavicular LAN RESPIRATORY: breathing is even & unlabored, BS CTAB CARDIAC: Irregular heart rate, no murmur,no extra heart  sounds, BLE edema 2+ GI: abdomen soft, normal BS, no masses, no tenderness, no hepatomegaly, no splenomegaly; +perc drain draining to bag EXTREMITIES: Able to move 4 extremities PSYCHIATRIC: the patient is alert & oriented to person, affect & behavior appropriate  LABS/RADIOLOGY: Labs reviewed: Basic Metabolic Panel:  Recent Labs  40/98/11 1820  05/29/14 0301 06/03/14 0436 06/05/14 0454  NA 137  < > 138 137 140  K 4.3  < > 3.6 3.3* 4.1  CL 100  < > 111 108 106  CO2 23  < > 18* 23 24  GLUCOSE 128*  < > 96 101* 90  BUN 42*  < > 43* 14 13  CREATININE 1.14*  < > 1.11* 0.62 0.75  CALCIUM 8.7  < > 7.7* 8.1* 8.4  MG 2.2  --   --  2.2  --   < > = values in this interval not displayed. Liver Function Tests:  Recent Labs  05/27/14 2019 05/28/14 0028 05/29/14 0301  AST 271* 245* 125*  ALT 431* 387* 269*  ALKPHOS 126* 122* 87  BILITOT 1.4* 1.1 1.2  PROT 6.7 6.6 6.3  ALBUMIN 3.0* 2.9* 2.6*    Recent Labs  05/27/14 1039 05/28/14 0028  LIPASE 67* 59   CBC:  Recent Labs  09/11/13 1820  05/27/14 1012 05/27/14 2019  06/03/14 0436 06/04/14 0554 06/05/14 0454  WBC 6.2  < > 8.8 6.7  < > 7.2 9.1 8.1  NEUTROABS 4.1  --  7.0 5.1  --   --   --   --  HGB 9.6*  < > 12.8 10.2*  < > 9.8* 10.2* 10.6*  HCT 28.8*  < > 38.7 30.9*  < > 29.8* 31.7* 32.6*  MCV 65.6*  < > 67.4* 69.3*  < > 67.3* 70.0* 69.2*  PLT 236  < > 195 159  < > 251 298 PLATELET CLUMPS NOTED ON SMEAR, COUNT APPEARS ADEQUATE  < > = values in this interval not displayed.  Lipid Panel:  Recent Labs  09/12/13 0535 05/28/14 0305  HDL 46 28*   Cardiac Enzymes:  Recent Labs  05/27/14 2019 05/28/14 0028 05/28/14 0655  TROPONINI 1.19* 1.08* 0.86*   CBG:  Recent Labs  09/11/13 1303  GLUCAP 82    Ct Abdomen Pelvis W Contrast  05/27/2014   CLINICAL DATA:  Abdominal pain. Nausea, vomiting. Decreased p.o. intake and shortness of breath.  EXAM: CT ABDOMEN AND PELVIS WITH CONTRAST  TECHNIQUE: Multidetector  CT imaging of the abdomen and pelvis was performed using the standard protocol following bolus administration of intravenous contrast.  CONTRAST:  80mL OMNIPAQUE IOHEXOL 300 MG/ML  SOLN  COMPARISON:  CT of the abdomen and pelvis 01/20/2013  FINDINGS: Lower chest: There are fibrotic changes at the lung bases. Heart is markedly enlarged. Coronary artery and mitral annulus calcifications are present. No pericardial effusion.  Upper abdomen: There is a diffusely heterogeneous appearance of the liver without discrete lesions. Findings are consistent with hepatic congestion. No focal abnormality identified within the spleen, pancreas. Bilateral adrenal lesions appear stable over prior studies, measuring 1.4 cm on the right and 3.5 cm on the left. Gallbladder is markedly distended and measures 17 cm in length. There is no CT evidence for stones or pericholecystic fluid.  Gastrointestinal tract: The stomach and small bowel loops are normal in caliber. There is significant stranding surrounding the ascending colon which contains numerous diverticula. Colonic wall in this region is thickened and associated with numerous diverticula. No evidence for perforation. The appendix is well seen and has a normal appearance.  Pelvis: The uterus is surgically absent. No adnexal mass. Adjacent to the left aspect of the base the bladder there is a small fluid-filled structure of likely representing a small bladder diverticulum.  Retroperitoneum: No retroperitoneal or mesenteric adenopathy.  Abdominal wall: There is diffuse body wall edema. 5.2 x 1.3 cm lipoma identified in the right mid abdominal subcutaneous tissues.  Osseous structures: Scoliosis. No suspicious lytic or blastic lesions are identified.  IMPRESSION: 1. Marked distension of the gallbladder. Ultrasound may be helpful for further evaluation regarding the duct and possible gallstones. 2. Hepatic congestion with diffusely mottled appearance of the liver parenchyma. 3. Stable  bilateral adrenal nodules. 4. Significant diverticulosis. Inflammation associated with the ascending colon favored to be related to diverticulitis. Malignancy should also be considered and followup is recommended. Colonoscopy and/or contrast enema may be helpful for further evaluation following clinical improvement. 5. Diffuse body wall edema. 6. Cardiomegaly.  Mitral annulus and coronary calcifications.   Electronically Signed   By: Rosalie Gums M.D.   On: 05/27/2014 13:39   US Abdomen Limited  05/27/2014   CLINICAL DATA:  Abdominal pain. Follow-up distended gallbladder on CT.  EXAM: US ABDOMEN LIMITED - RIGHT UPPER QUADRANT  COMPARISON:  CT of the abdomen and pelvis May 27, 2014 at 1303 hr  FINDINGS: Gallbladder:  Layering gallbladder sludge, tiny echogenic gallstones with acoustic shadowing. Gallbladder distention without wall thickening or pericholecystic fluid. No sonographic Murphy's sign elicited.  Common bile duct:  Diameter: 5 mm  Liver:  No focal lesion identified. Within normal limits in parenchymal echogenicity. Hepatopetal portal vein.  IMPRESSION: Distended gallbladder with sludge/ cholelithiasis, no superimposed findings of acute cholecystitis.   Electronically Signed   By: Awilda Metro   On: 05/27/2014 15:01   Ir Perc Cholecystostomy  05/28/2014   CLINICAL DATA:  81 year old female with acute calculus cholecystitis resulting in secondary demand ischemia. She is currently not a surgical candidate. Percutaneous cholecystostomy tube is warranted.  EXAM: CHOLECYSTOSTOMY  Date: 05/28/2014  PROCEDURE: 1. Percutaneous transhepatic cholecystostomy tube with ultrasound and fluoroscopic guidance Interventional Radiologist:  Sterling Big, MD  ANESTHESIA/SEDATION: Moderate (conscious) sedation was used. One mg Versed, 50 mcg Fentanyl were administered intravenously. The patient's vital signs were monitored continuously by radiology nursing throughout the procedure.  Sedation Time: 9  minutes  MEDICATIONS: 3.375 g Zosyn administered intravenously within 1 hr of skin incision  FLUOROSCOPY TIME:  1 min  12.9 mGy  CONTRAST:  10mL OMNIPAQUE IOHEXOL 300 MG/ML  SOLN  TECHNIQUE: Informed consent was obtained from the patient following explanation of the procedure, risks, benefits and alternatives. The patient understands, agrees and consents for the procedure. All questions were addressed. A time out was performed.  Maximal barrier sterile technique utilized including caps, mask, sterile gowns, sterile gloves, large sterile drape, hand hygiene, and Betadine skin prep.  The right upper quadrant was interrogated with ultrasound. A suitable skin entry site that would allow for a transhepatic course into the gallbladder lumen was selected and marked. Local anesthesia was attained by infiltration with 1% lidocaine. A small dermatotomy was made. Under real-time sonographic guidance, a 21 gauge micropuncture needle was advanced through a short transhepatic course and into the gallbladder lumen. Removal of the stylet revealed free return of cloudy yellow bile. A gentle hand injection of contrast material confirmed opacification of the gallbladder lumen. The 0.018 inch wire was then coiled within the gallbladder lumen and the needle exchanged for the Accustick sheath.  The 0.018 inch wire was then exchanged for a short Amplatz wire. The tract was dilated to 10 [place] and a Cook 10.2 [place] all-purpose drainage catheter was advanced over the wire and formed within the gallbladder lumen. Approximately 120 mL of cloudy dark golden-brown bile was aspirated. A sample was sent for culture and sensitivities. The catheter was then flushed and connected to gravity bag drainage. The catheter was secured to the skin with 0 Prolene suture and a sterile bandage.  The patient tolerated the procedure well.  COMPLICATIONS: None  IMPRESSION: Successful placement of a 10 French transhepatic percutaneous cholecystostomy tube for  acute calculus cholecystitis.  PLAN: 1. Maintain tube to gravity bag drainage. 2. May resume heparin in 2 hr. 3. Return to Interventional Radiology for percutaneous cholecystostomy tube check and change in 6- 8 weeks if interval cholecystectomy has not been performed. Signed,  Sterling Big, MD  [facility]  [facility]   Electronically Signed   By: Malachy Moan M.D.   On: 05/28/2014 17:08   Dg Chest Port 1 View  05/27/2014   CLINICAL DATA:  Mid chest and abdominal pain since Saturday. Intermittent cough.  EXAM: PORTABLE CHEST - 1 VIEW  COMPARISON:  01/20/2013.  FINDINGS: Trachea is midline. Heart size stable. The loop recorder projects over the left heart. Linear scarring or thickening of the minor fissure in the mid right hemi thorax. Probable scarring at both lung bases. Lungs are otherwise clear. No pleural fluid. Right hemidiaphragm is rather elevated, as before. Calcifications in the  right upper quadrant may represent gallstones.  IMPRESSION: No acute findings.   Electronically Signed   By: Leanna Battles M.D.   On: 05/27/2014 11:08    ASSESSMENT/PLAN:  Chronic diastolic CHF - start Lasix 20 mg 1 by mouth daily; BMP in 1 week; weight every Mondays and Wednesdays; notify M.D./NP +- 5 lbs    MEDINA-VARGAS,MONINA, NP [employer] [phone]

## 2014-06-26 ENCOUNTER — Other Ambulatory Visit (INDEPENDENT_AMBULATORY_CARE_PROVIDER_SITE_OTHER): Payer: Self-pay | Admitting: General Surgery

## 2014-06-26 DIAGNOSIS — K8000 Calculus of gallbladder with acute cholecystitis without obstruction: Secondary | ICD-10-CM

## 2014-07-01 ENCOUNTER — Non-Acute Institutional Stay (SKILLED_NURSING_FACILITY): Payer: Medicare Other | Admitting: Adult Health

## 2014-07-01 ENCOUNTER — Encounter: Payer: Self-pay | Admitting: Adult Health

## 2014-07-01 DIAGNOSIS — E43 Unspecified severe protein-calorie malnutrition: Secondary | ICD-10-CM

## 2014-07-01 DIAGNOSIS — D509 Iron deficiency anemia, unspecified: Secondary | ICD-10-CM

## 2014-07-01 DIAGNOSIS — I5032 Chronic diastolic (congestive) heart failure: Secondary | ICD-10-CM

## 2014-07-01 DIAGNOSIS — I4891 Unspecified atrial fibrillation: Secondary | ICD-10-CM

## 2014-07-01 DIAGNOSIS — K810 Acute cholecystitis: Secondary | ICD-10-CM

## 2014-07-01 DIAGNOSIS — K5900 Constipation, unspecified: Secondary | ICD-10-CM

## 2014-07-01 DIAGNOSIS — R5381 Other malaise: Secondary | ICD-10-CM

## 2014-07-01 DIAGNOSIS — K219 Gastro-esophageal reflux disease without esophagitis: Secondary | ICD-10-CM

## 2014-07-01 NOTE — Progress Notes (Signed)
Patient ID: [name], female   DOB: 17-Apr-1930, 81 y.o.   MRN: [id number]   07/01/2014  Facility:  Nursing Home Location:  [facility] Room Number: 1108-P LEVEL OF CARE:  SNF (31)   Chief Complaint  Patient presents with  . Medical Management of Chronic Issues    Physical deconditioning, acute cholecystitis, atrial fibrillation, chronic diastolic CHF, constipation, protein calorie malnutrition, anemia and GERD    HISTORY OF PRESENT ILLNESS:  This is an 81 year old female who is has been admitted to [facility] on 06/05/14 from [facility]. She was complaining of vague abdominal pain associated with poor appetite, diarrhea, vomiting and generalized weakness. She was noted to be on AFIB with RVR. She was given Cardizem IV and switched to Amiodarone PO now. CT of abdomen done showed mild distention of gallbladder with possible ductal obstruction and gallstones and questionable diverticulitis. She was given antibiotics. She had percutaneous drainage of gallbladder. She is for possible cholecystectomy and percutaneous drainage will be discontinued after 6-8 wks. She has PMH of  CAD, GERD, history of MI, history of recurrent syncope and Dementia.  PAST MEDICAL HISTORY:  Past Medical History  Diagnosis Date  . Syncope   . Atypical chest pain     Nonobstructive catheterization; negative Myoview 2011  . CVA 11/23/2008  . HYPERTENSION 11/23/2008  . Coronary artery disease   . 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 Other primary cardiomyopathies   . Dementia 11/23/2008  . Non Q wave myocardial infarction hx    notes 09/11/2013  . GERD (gastroesophageal reflux disease)   . SEIZURE DISORDER 11/23/2008    "she's had blackouts so loop recorder placed" (09/11/2013)  . Arthritis     "hands, legs, back" (09/11/2013)  . Status post placement of implantable loop recorder     CURRENT MEDICATIONS: Reviewed per MAR/see medication list  Allergies  Allergen  Reactions  . Codeine Other (See Comments)    UNKNOWN; Per MAR     REVIEW OF SYSTEMS:  GENERAL: no fever, chills or weakness RESPIRATORY: no cough, SOB, DOE, wheezing, hemoptysis CARDIAC: no chest pain, or palpitations, +edema GI: no abdominal pain, diarrhea, constipation, heart burn, nausea or vomiting  PHYSICAL EXAMINATION  GENERAL: no acute distress, normal body habitus NECK: supple, trachea midline, no neck masses, no thyroid tenderness, no thyromegaly LYMPHATICS: no LAN in the neck, no supraclavicular LAN RESPIRATORY: breathing is even & unlabored, BS CTAB CARDIAC: Irregular heart rate, no murmur,no extra heart sounds, BLE edema 1+ GI: abdomen soft, normal BS, no masses, no tenderness, no hepatomegaly, no splenomegaly; +perc drain draining to bag EXTREMITIES: Able to move 4 extremities PSYCHIATRIC: the patient is alert & oriented to person, affect & behavior appropriate  LABS/RADIOLOGY: 07/01/14  sodium 136 potassium 4.9 glucose 92 BUN 54 creatinine 1.29 calcium 8.8 06/27/14  cholesterol 81 HDL 39 LDL 25 triglycerides 86 TSH 9.01 06/13/14  WBC 8.2 hemoglobin 10.1 hematocrit 31.7 MCV 72.0 sodium 138 potassium 4.3 glucose 99 BUN 45 creatinine 1.0 calcium 8.8 total protein 7 albumin 3.3 ALP 129 AST 60 ALT 55 GFR>60 Labs reviewed: Basic Metabolic Panel:  Recent Labs  38/46/65 1820  05/29/14 0301 06/03/14 0436 06/05/14 0454  NA 137  < > 138 137 140  K 4.3  < > 3.6 3.3* 4.1  CL 100  < > 111 108 106  CO2 23  < > 18* 23 24  GLUCOSE 128*  < > 96 101* 90  BUN 42*  < >  43* 14 13  CREATININE 1.14*  < > 1.11* 0.62 0.75  CALCIUM 8.7  < > 7.7* 8.1* 8.4  MG 2.2  --   --  2.2  --   < > = values in this interval not displayed. Liver Function Tests:  Recent Labs  05/27/14 2019 05/28/14 0028 05/29/14 0301  AST 271* 245* 125*  ALT 431* 387* 269*  ALKPHOS 126* 122* 87  BILITOT 1.4* 1.1 1.2  PROT 6.7 6.6 6.3  ALBUMIN 3.0* 2.9* 2.6*    Recent Labs  05/27/14 1039  05/28/14 0028  LIPASE 67* 59   CBC:  Recent Labs  09/11/13 1820  05/27/14 1012 05/27/14 2019  06/03/14 0436 06/04/14 0554 06/05/14 0454  WBC 6.2  < > 8.8 6.7  < > 7.2 9.1 8.1  NEUTROABS 4.1  --  7.0 5.1  --   --   --   --   HGB 9.6*  < > 12.8 10.2*  < > 9.8* 10.2* 10.6*  HCT 28.8*  < > 38.7 30.9*  < > 29.8* 31.7* 32.6*  MCV 65.6*  < > 67.4* 69.3*  < > 67.3* 70.0* 69.2*  PLT 236  < > 195 159  < > 251 298 PLATELET CLUMPS NOTED ON SMEAR, COUNT APPEARS ADEQUATE  < > = values in this interval not displayed.  Lipid Panel:  Recent Labs  09/12/13 0535 05/28/14 0305  HDL 46 28*   Cardiac Enzymes:  Recent Labs  05/27/14 2019 05/28/14 0028 05/28/14 0655  TROPONINI 1.19* 1.08* 0.86*   CBG:  Recent Labs  09/11/13 1303  GLUCAP 82   Ct Abdomen Pelvis W Contrast  05/27/2014   CLINICAL DATA:  Abdominal pain. Nausea, vomiting. Decreased p.o. intake and shortness of breath.  EXAM: CT ABDOMEN AND PELVIS WITH CONTRAST  TECHNIQUE: Multidetector CT imaging of the abdomen and pelvis was performed using the standard protocol following bolus administration of intravenous contrast.  CONTRAST:  80mL OMNIPAQUE IOHEXOL 300 MG/ML  SOLN  COMPARISON:  CT of the abdomen and pelvis 01/20/2013  FINDINGS: Lower chest: There are fibrotic changes at the lung bases. Heart is markedly enlarged. Coronary artery and mitral annulus calcifications are present. No pericardial effusion.  Upper abdomen: There is a diffusely heterogeneous appearance of the liver without discrete lesions. Findings are consistent with hepatic congestion. No focal abnormality identified within the spleen, pancreas. Bilateral adrenal lesions appear stable over prior studies, measuring 1.4 cm on the right and 3.5 cm on the left. Gallbladder is markedly distended and measures 17 cm in length. There is no CT evidence for stones or pericholecystic fluid.  Gastrointestinal tract: The stomach and small bowel loops are normal in caliber. There  is significant stranding surrounding the ascending colon which contains numerous diverticula. Colonic wall in this region is thickened and associated with numerous diverticula. No evidence for perforation. The appendix is well seen and has a normal appearance.  Pelvis: The uterus is surgically absent. No adnexal mass. Adjacent to the left aspect of the base the bladder there is a small fluid-filled structure of likely representing a small bladder diverticulum.  Retroperitoneum: No retroperitoneal or mesenteric adenopathy.  Abdominal wall: There is diffuse body wall edema. 5.2 x 1.3 cm lipoma identified in the right mid abdominal subcutaneous tissues.  Osseous structures: Scoliosis. No suspicious lytic or blastic lesions are identified.  IMPRESSION: 1. Marked distension of the gallbladder. Ultrasound may be helpful for further evaluation regarding the duct and possible gallstones. 2. Hepatic congestion with diffusely mottled  appearance of the liver parenchyma. 3. Stable bilateral adrenal nodules. 4. Significant diverticulosis. Inflammation associated with the ascending colon favored to be related to diverticulitis. Malignancy should also be considered and followup is recommended. Colonoscopy and/or contrast enema may be helpful for further evaluation following clinical improvement. 5. Diffuse body wall edema. 6. Cardiomegaly.  Mitral annulus and coronary calcifications.   Electronically Signed   By: Rosalie Gums M.D.   On: 05/27/2014 13:39   US Abdomen Limited  05/27/2014   CLINICAL DATA:  Abdominal pain. Follow-up distended gallbladder on CT.  EXAM: US ABDOMEN LIMITED - RIGHT UPPER QUADRANT  COMPARISON:  CT of the abdomen and pelvis May 27, 2014 at 1303 hr  FINDINGS: Gallbladder:  Layering gallbladder sludge, tiny echogenic gallstones with acoustic shadowing. Gallbladder distention without wall thickening or pericholecystic fluid. No sonographic Murphy's sign elicited.  Common bile duct:  Diameter: 5 mm   Liver:  No focal lesion identified. Within normal limits in parenchymal echogenicity. Hepatopetal portal vein.  IMPRESSION: Distended gallbladder with sludge/ cholelithiasis, no superimposed findings of acute cholecystitis.   Electronically Signed   By: Awilda Metro   On: 05/27/2014 15:01   Ir Perc Cholecystostomy  05/28/2014   CLINICAL DATA:  81 year old female with aContinue amiodarone 200 mg by mouth dailycute calculus cholecystitis resulting in secondary demand ischemia. She is currently not a surgical candidate. Percutaneous cholecystostomy tube is warranted.  EXAM: CHOLECYSTOSTOMY  Date: 05/28/2014  PROCEDURE: 1. Percutaneous transhepatic cholecystostomy tube with ultrasound and fluoroscoConstipation constipationpic guidance Interventional Radiologist:  He protein calorie Narda Amber, MD  ANESTHESIA/SEDATION: Moderate (conscious) sedation was used. One mg Versed, 50 mcg Fentanyl were administered intravenously. The patient's vital signs were monitored continuously by radiology nursing throughout the procedure.  Sedation Time: 9 minutes  MEDICATIONS: 3.375 g Zosyn administered intravenously within 1 hr of skin incision  FLUOROSCOPY TIME:  1 min  12.9 mGy  CONTRAST:  10mL OMNIPAQUE IOHEXOL 300 MG/ML  SOLN  TECHNIQUE: Informed consent was obtained from the patient following explanation of the procedure, risks, benefits and alternatives. The patient understands, agrees and consents for the procedure. All questions were addressed. A time out was performed.  Maximal barrier sterile technique utilized including caps, mask, sterile gowns, sterile gloves, large sterile drape, hand hygiene, and Betadine skin prep.  The right upper quadrant was interrogated with ultrasound. A suitable skin entry site that would allow for a transhepatic course into the gallbladder lumen was selected and marked. Local anesthesia was attained by infiltration with 1% lidocaine. A small dermatotomy was made. Under  real-time sonographic guidance, a 21 gauge micropuncture needle was advanced through a short transhepatic course and into the gallbladder lumen. Removal of the stylet revealed free return of cloudy yellow bile. A gentle hand injection of contrast material confirmed opacification of the gallbladder lumen. The 0.018 inch wire was then coiled within the gallbladder lumen and the needle exchanged for the Accustick sheath.  The 0.018 inch wire was then exchanged for a short Amplatz wire. The tract was dilated to 10 [place] and a Cook 10.2 [place] all-purpose drainage catheter was advanced over the wire and formed within the gallbladder lumen. Approximately 120 mL of cloudy dark golden-brown bile was aspirated. A sample was sent for culture and sensitivities. The catheter was then flushed and connected to gravity bag drainage. The catheter was secured to the skin with 0 Prolene suture and a sterile bandage.  The patient tolerated the procedure well.  COMPLICATIONS: None  IMPRESSION: Successful placement of a 10  French transhepatic percutaneous cholecystostomy tube for acute calculus cholecystitis.  PLAN: 1. Maintain tube to gravity bag drainage. 2. May resume heparin in 2 hr. 3. Return to Interventional Radiology for percutaneous cholecystostomy tube check and change in 6- 8 weeks if interval cholecystectomy has not been performed. Signed,  Sterling Big, MD  [facility]  [facility]   Electronically Signed   By: Malachy Moan M.D.   On: 05/28/2014 17:08   Dg Chest Port 1 View  05/27/2014   CLINICAL DATA:  Mid chest and abdominal pain since Saturday. Intermittent cough.  EXAM: PORTABLE CHEST - 1 VIEW  COMPARISON:  01/20/2013.  FINDINGS: Trachea is midline. Heart size stable. The loop recorder projects over the left heart. Linear scarring or thickening of the minor fissure in the mid right hemi thorax. Probable scarring at both lung bases. Lungs are otherwise  clear. No pleural fluid. Right hemidiaphragm is rather elevated, as before. Calcifications in the right upper quadrant may represent gallstones.  IMPRESSION: No acute findings.   Electronically Signed   By: Leanna Battles M.D.   On: 05/27/2014 11:08   ASSESSMENT/PLAN:  Physical deconditioning - continue rehabilitation Chronic diastolic CHF - creatinine  1.29 and has poor appetite; change Lasix to 20 mg PO Q D PRN. Continue Lisinopril 2.5 mg PO Q D and Metoprolol succinate 50 mg PO Q D Acute cholecystitis - S/P percutaneous drainage; monitor percutaneous drainage output  A. Fib with RVR - rate-controlled; continue Amiodarone 200 mg 1 tab PO Q D, Metoprolol succinate 50 mg PO Q D and Coumadin Constipation - start Senna-S 2 tabs PO Q HS Protein-Calorie Malnutrition, severe - continue supplementation Anemia, acute blood loss - hgb 10.1; continue FeSO4 325 mg PO BID GERD - continue Omeprazole 20 mg PO Q D   Goals of care:  Short-term rehabilitation    Labs/test ordered:  CBC and BMP in 1 week     [facility], NP [facility] [phone]

## 2014-07-09 ENCOUNTER — Ambulatory Visit (HOSPITAL_COMMUNITY)
Admission: RE | Admit: 2014-07-09 | Discharge: 2014-07-09 | Disposition: A | Discharge status: Home / Self Care | Payer: Medicare Other | Source: Ambulatory Visit | Attending: General Surgery | Admitting: General Surgery

## 2014-07-09 DIAGNOSIS — K8000 Calculus of gallbladder with acute cholecystitis without obstruction: Secondary | ICD-10-CM

## 2014-07-09 DIAGNOSIS — K820 Obstruction of gallbladder: Secondary | ICD-10-CM | POA: Diagnosis not present

## 2014-07-09 DIAGNOSIS — K8020 Calculus of gallbladder without cholecystitis without obstruction: Secondary | ICD-10-CM | POA: Diagnosis present

## 2014-07-09 MED ORDER — IOHEXOL 300 MG/ML  SOLN
50.0000 mL | Freq: Once | INTRAMUSCULAR | Status: AC | PRN
  Administered 2014-07-09: 10 mL

## 2014-07-09 MED ORDER — IOHEXOL 300 MG/ML  SOLN: 50.0000 mL | Freq: Once | INTRAMUSCULAR | Status: AC | PRN

## 2014-07-26 ENCOUNTER — Ambulatory Visit (INDEPENDENT_AMBULATORY_CARE_PROVIDER_SITE_OTHER): Payer: Medicare Other | Admitting: Endocrinology

## 2014-07-26 ENCOUNTER — Encounter: Payer: Self-pay | Admitting: Endocrinology

## 2014-07-26 VITALS — BP 104/60 | HR 84 | Temp 97.9°F

## 2014-07-26 DIAGNOSIS — E079 Disorder of thyroid, unspecified: Secondary | ICD-10-CM

## 2014-07-26 DIAGNOSIS — R208 Other disturbances of skin sensation: Secondary | ICD-10-CM

## 2014-07-26 LAB — VITAMIN D 25 HYDROXY (VIT D DEFICIENCY, FRACTURES): VITD: 6.63 ng/mL — AB (ref 30.00–100.00)

## 2014-07-26 LAB — T3, FREE: T3, Free: 1.9 pg/mL — ABNORMAL LOW (ref 2.3–4.2)

## 2014-07-26 LAB — VITAMIN B12: Vitamin B-12: 658 pg/mL (ref 211–911)

## 2014-07-26 LAB — TSH: TSH: 12.4 u[IU]/mL — AB (ref 0.35–4.50)

## 2014-07-26 LAB — T4, FREE: FREE T4: 0.9 ng/dL (ref 0.60–1.60)

## 2014-07-26 MED ORDER — LEVOTHYROXINE SODIUM 25 MCG PO TABS: 25.0000 ug | ORAL_TABLET | Freq: Every day | ORAL | Status: DC

## 2014-07-26 MED ORDER — VITAMIN D (ERGOCALCIFEROL) 1.25 MG (50000 UNIT) PO CAPS: 50000.0000 [IU] | ORAL_CAPSULE | ORAL | Status: DC

## 2014-07-26 NOTE — Progress Notes (Signed)
Subjective:    Patient ID: [name], female    DOB: 1929/09/18, 81 y.o.   MRN: [id number]  HPI Pt has been on amiodarone since late 2015.  She also had a GB drain placed in late 2015. she has never been on prescribed thyroid hormone therapy.  she has never taken kelp or any other type of non-prescribed thyroid product.  she has never had thyroid imaging.  she has never had thyroid surgery, or XRT to the neck.  she has never been on lithium.   Past Medical History  Diagnosis Date  . Syncope   . Atypical chest pain     Nonobstructive catheterization; negative Myoview 2011  . CVA 11/23/2008  . HYPERTENSION 11/23/2008  . Coronary artery disease   . 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 Other primary cardiomyopathies   . Dementia 11/23/2008  . Non Q wave myocardial infarction hx    notes 09/11/2013  . GERD (gastroesophageal reflux disease)   . SEIZURE DISORDER 11/23/2008    "she's had blackouts so loop recorder placed" (09/11/2013)  . Arthritis     "hands, legs, back" (09/11/2013)  . Status post placement of implantable loop recorder     Past Surgical History  Procedure Laterality Date  . Loop recorder implant  ?2010  . Colonoscopy    . Eye surgery    . Cataract extraction w/ intraocular lens  implant, bilateral Bilateral   . Laparotomy  11/30/2011    Procedure: EXPLORATORY LAPAROTOMY;  Surgeon: Liz Malady, MD;  Location: [facility] OR;  Service: General;  Laterality: N/A;  Exploratory Laparotomy with Primary Repair Obturator Hernia  . Cardiac catheterization  10/2011  . Left heart catheterization with coronary angiogram N/A 11/26/2011    Procedure: LEFT HEART CATHETERIZATION WITH CORONARY ANGIOGRAM;  Surgeon: Robynn Pane, MD;  Location: [facility] CATH LAB;  Service: Cardiovascular;  Laterality: N/A;    History   Social History  . Marital Status: Widowed    Spouse Name: N/A  . Number of Children: N/A  . Years of Education: N/A   Occupational History  . sitter    Social  History Main Topics  . Smoking status: Former Smoker    Types: Cigarettes  . Smokeless tobacco: Never Used     Comment: 09/11/2013 "stopped smoking in the 1960's; only puffed occasionally when I did smoke; didn't smoke long"  . Alcohol Use: No  . Drug Use: No  . Sexual Activity: No   Other Topics Concern  . Not on file   Social History Narrative   Widowed    Current Outpatient Prescriptions on File Prior to Visit  Medication Sig Dispense Refill  . amiodarone (PACERONE) 200 MG tablet Take 1 tablet (200 mg total) by mouth daily. 30 tablet 3  . aspirin EC 81 MG EC tablet Take 1 tablet (81 mg total) by mouth daily. 30 tablet 3  . feeding supplement (ENSURE COMPLETE) LIQD Take 237 mLs by mouth 2 (two) times daily between meals. 60 Bottle 1  . ferrous gluconate (FERGON) 324 MG tablet Take 1 tablet (324 mg total) by mouth daily with breakfast. 30 tablet 3  . lisinopril (PRINIVIL,ZESTRIL) 5 MG tablet Take 2.5 mg by mouth daily.    . metoprolol succinate (TOPROL XL) 50 MG 24 hr tablet Take 1 tablet (50 mg total) by mouth daily. Take with or immediately following a meal. 30 tablet 3  . omeprazole (PRILOSEC) 20 MG capsule Take 20 mg by mouth daily.    Marland Kitchen  polyethylene glycol (MIRALAX / GLYCOLAX) packet Take 17 g by mouth every other day.     . simvastatin (ZOCOR) 20 MG tablet Take 20 mg by mouth every evening.    . warfarin (COUMADIN) 2 MG tablet Take 1 tablet (2 mg total) by mouth daily. 30 tablet 3   No current facility-administered medications on file prior to visit.    Allergies  Allergen Reactions  . Codeine Other (See Comments)    UNKNOWN; Per MAR    Family History  Problem Relation Age of Onset  . Heart failure Neg Hx   . Stroke    . Heart disease    . Thyroid disease Neg Hx     BP 104/60 mmHg  Pulse 84  Temp(Src) 97.9 F (36.6 C) (Oral)  SpO2 97%   Review of Systems denies depression, hair loss, muscle cramps, sob, weight change, memory loss, constipation, numbness,  blurry vision, cold intolerance, myalgias, dry skin, rhinorrhea, easy bruising, and syncope.      Objective:   Physical Exam Vital signs: see vs page Gen: elderly, frail, no distress.  In wheelchair.  Has 02 on.   HEAD: head: no deformity eyes: no periorbital swelling, no proptosis external nose and ears are normal mouth: no lesion seen NECK: supple, thyroid is not enlarged CHEST WALL: no deformity.   LUNGS:  Clear to auscultation, except for a few rales at the bases.   CV: reg rate and rhythm, no murmur.   ABD: abdomen is soft, nontender.  no hepatosplenomegaly.  not distended.  no hernia.  Biliary drain is present.   MUSCULOSKELETAL: muscle bulk and strength are grossly normal.  no obvious joint swelling.  gait is normal and steady EXTEMITIES: no deformity.  no ulcer on the feet.  feet are of normal color and temp.  2+ bilat leg edema.  There is bilateral onychomycosis of the toenails.   PULSES: dorsalis pedis intact bilat.  no carotid bruit NEURO:  cn 2-12 grossly intact.   readily moves all 4's.  sensation is intact to touch on the feet, but decreased from normal SKIN:  Normal texture and temperature.  No rash or suspicious lesion is visible.   NODES:  None palpable at the neck PSYCH: alert, oriented to self and place.  Does not appear anxious nor depressed.    outside test results are reviewed: Ca++=8.2 Alb=3.3 TSH=9  CXR (2015) no mention is made of any goiter.   Today: Lab Results  Component Value Date   TSH 12.40* 07/26/2014  B-12=normal  Vit-D=6.     Assessment & Plan:  Hypothyroidism, possibly due to amiodarone, new. Dementia: severe: in this context, she should have aggressive rx of hypothyroidism and electrolyte abnormality.   Decreased sensation on the feet, uncertain etiology.  malnutrition, severe exacerbation, including severe vit-D deficiency.    Patient is advised the following: Patient Instructions  blood tests are being requested for you today.   We'll let you know about the results. I would be happy to see you back here whenever you want.   You may need treatment for the abnormal thyroid and calcium.  It is also possible that the abnomalities are the body's adapting to her illness and amiodarone (if that is the case, no treatment is needed--just to repeat the blood tests in 2-3 months.  I would be happy to do here in the office, or Dr Sharyn Lull would be happy to do I'm sure).    addendum: i have sent a prescription to your pharmacy,  for synthroid and vit-D.  Please come back for a follow-up appointment in 6 weeks.

## 2014-07-26 NOTE — Patient Instructions (Addendum)
blood tests are being requested for you today.  We'll let you know about the results. I would be happy to see you back here whenever you want.   You may need treatment for the abnormal thyroid and calcium.  It is also possible that the abnomalities are the body's adapting to her illness and amiodarone (if that is the case, no treatment is needed--just to repeat the blood tests in 2-3 months.  I would be happy to do here in the office, or Dr Sharyn Lull would be happy to do I'm sure).

## 2014-07-29 LAB — PTH, INTACT AND CALCIUM
Calcium: 8.2 mg/dL — ABNORMAL LOW (ref 8.4–10.5)
PTH: 246 pg/mL — ABNORMAL HIGH (ref 14–64)

## 2014-08-16 ENCOUNTER — Encounter: Payer: Self-pay | Admitting: Adult Health

## 2014-08-16 ENCOUNTER — Non-Acute Institutional Stay (SKILLED_NURSING_FACILITY): Payer: Medicare Other | Admitting: Adult Health

## 2014-08-16 DIAGNOSIS — R5381 Other malaise: Secondary | ICD-10-CM | POA: Diagnosis not present

## 2014-08-16 DIAGNOSIS — I4891 Unspecified atrial fibrillation: Secondary | ICD-10-CM

## 2014-08-16 DIAGNOSIS — K5900 Constipation, unspecified: Secondary | ICD-10-CM | POA: Diagnosis not present

## 2014-08-16 DIAGNOSIS — F0390 Unspecified dementia without behavioral disturbance: Secondary | ICD-10-CM

## 2014-08-16 DIAGNOSIS — I5032 Chronic diastolic (congestive) heart failure: Secondary | ICD-10-CM | POA: Diagnosis not present

## 2014-08-16 DIAGNOSIS — K219 Gastro-esophageal reflux disease without esophagitis: Secondary | ICD-10-CM

## 2014-08-16 DIAGNOSIS — E43 Unspecified severe protein-calorie malnutrition: Secondary | ICD-10-CM

## 2014-08-16 DIAGNOSIS — K810 Acute cholecystitis: Secondary | ICD-10-CM

## 2014-08-16 DIAGNOSIS — E032 Hypothyroidism due to medicaments and other exogenous substances: Secondary | ICD-10-CM | POA: Diagnosis not present

## 2014-08-16 NOTE — Progress Notes (Signed)
Patient ID: [name], female   DOB: Feb 28, 1930, 81 y.o.   MRN: [id number]   08/16/2014  Facility:  Nursing Home Location:  [facility] Room Number: 1004-2 LEVEL OF CARE:  SNF (31)   Chief Complaint  Patient presents with  . Medical Management of Chronic Issues    Physical deconditioning, atrial fibrillation, CHF, GERD, constipation, hyperlipidemia, dementia and hypothyroidism    HISTORY OF PRESENT ILLNESS:  This is an 81 year old female who is has been admitted to [facility] on 06/05/14 from [facility]. She was complaining of vague abdominal pain associated with poor appetite, diarrhea, vomiting and generalized weakness. She was noted to be on AFIB with RVR. She was given Cardizem IV and switched to Amiodarone PO now. CT of abdomen done showed mild distention of gallbladder with possible ductal obstruction and gallstones and questionable diverticulitis. She was given antibiotics. She had percutaneous drainage of gallbladder. She is for possible cholecystectomy and percutaneous drainage will be discontinued after 6-8 wks. She has PMH of  CAD, GERD, history of MI, history of recurrent syncope and Dementia.  Recently, she has been diagnosed with hypothyroidism - tsh 12.40. She is currently on Amiodarone and might be causing her to have elevated tsh.  She is here for a short-term rehabilitation.  PAST MEDICAL HISTORY:  Past Medical History  Diagnosis Date  . Syncope   . Atypical chest pain     Nonobstructive catheterization; negative Myoview 2011  . CVA 11/23/2008  . HYPERTENSION 11/23/2008  . Coronary artery disease   . 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 Other primary cardiomyopathies   . Dementia 11/23/2008  . Non Q wave myocardial infarction hx    notes 09/11/2013  . GERD (gastroesophageal reflux disease)   . SEIZURE DISORDER 11/23/2008    "she's had blackouts so loop recorder placed" (09/11/2013)  . Arthritis     "hands, legs,  back" (09/11/2013)  . Status post placement of implantable loop recorder     CURRENT MEDICATIONS: Reviewed per MAR/see medication list  Allergies  Allergen Reactions  . Codeine Other (See Comments)    UNKNOWN; Per MAR     REVIEW OF SYSTEMS:  GENERAL: no fever, chills or weakness RESPIRATORY: no cough, SOB, DOE, wheezing, hemoptysis CARDIAC: no chest pain, or palpitations, +edema GI: no abdominal pain, diarrhea, constipation, heart burn, nausea or vomiting  PHYSICAL EXAMINATION  GENERAL: no acute distress, normal body habitus NECK: supple, trachea midline, no neck masses, no thyroid tenderness, no thyromegaly LYMPHATICS: no LAN in the neck, no supraclavicular LAN RESPIRATORY: breathing is even & unlabored, BS CTAB CARDIAC: Irregular heart rate, no murmur,no extra heart sounds, BLE edema 2+ GI: abdomen soft, normal BS, no masses, no tenderness, no hepatomegaly, no splenomegaly; +perc drain draining to bag EXTREMITIES: Able to move 4 extremities PSYCHIATRIC: the patient is alert & oriented to person, affect & behavior appropriate  LABS/RADIOLOGY: 07/29/14  sodium 138 potassium 4.4 glucose 73 BUN 44 creatinine 1.04 calcium 8.1 07/26/14  tsh 12.40 07/19/14  WBC 6.0 hemoglobin 12.9 hematocrit 39.9 MCV 69.9 sodium 137 potassium 4.9 glucose 81 BUN 59 creatinine 1.30 calcium 8.2 07/08/14  WBC 5.5 hemoglobin 11.8 hematocrit 7.3 MCV 70.9 sodium 137 potassium 4.4 glucose 90 BUN 41 creatinine 0.96 calcium 8.1 total T4  6.0  total T3 63.7 07/03/14  sodium 138 potassium 4.2 glucose 96 BUN 52 creatinine 1.12 calcium 8.2 07/01/14  sodium 136 potassium 4.9 glucose 92 BUN 54 creatinine 1.29 calcium 8.8 total T4  1.18  total T3 1.5 06/27/14  cholesterol 81 HDL 39 LDL 25 triglycerides 86 TSH 9.01 06/13/14  WBC 8.2 hemoglobin 10.1 hematocrit 31.7 MCV 72.0 sodium 138 potassium 4.3 glucose 99 BUN 45 creatinine 1.0 calcium 8.8 total protein 7 albumin 3.3 ALP 129 AST 60 ALT 55 GFR>60 Labs reviewed: Basic  Metabolic Panel:  Recent Labs  58/85/02 1820  05/29/14 0301 06/03/14 0436 06/05/14 0454 07/26/14 1121  NA 137  < > 138 137 140  --   K 4.3  < > 3.6 3.3* 4.1  --   CL 100  < > 111 108 106  --   CO2 23  < > 18* 23 24  --   GLUCOSE 128*  < > 96 101* 90  --   BUN 42*  < > 43* 14 13  --   CREATININE 1.14*  < > 1.11* 0.62 0.75  --   CALCIUM 8.7  < > 7.7* 8.1* 8.4 8.2*  MG 2.2  --   --  2.2  --   --   < > = values in this interval not displayed. Liver Function Tests:  Recent Labs  05/27/14 2019 05/28/14 0028 05/29/14 0301  AST 271* 245* 125*  ALT 431* 387* 269*  ALKPHOS 126* 122* 87  BILITOT 1.4* 1.1 1.2  PROT 6.7 6.6 6.3  ALBUMIN 3.0* 2.9* 2.6*    Recent Labs  05/27/14 1039 05/28/14 0028  LIPASE 67* 59   CBC:  Recent Labs  09/11/13 1820  05/27/14 1012 05/27/14 2019  06/03/14 0436 06/04/14 0554 06/05/14 0454  WBC 6.2  < > 8.8 6.7  < > 7.2 9.1 8.1  NEUTROABS 4.1  --  7.0 5.1  --   --   --   --   HGB 9.6*  < > 12.8 10.2*  < > 9.8* 10.2* 10.6*  HCT 28.8*  < > 38.7 30.9*  < > 29.8* 31.7* 32.6*  MCV 65.6*  < > 67.4* 69.3*  < > 67.3* 70.0* 69.2*  PLT 236  < > 195 159  < > 251 298 PLATELET CLUMPS NOTED ON SMEAR, COUNT APPEARS ADEQUATE  < > = values in this interval not displayed.  Lipid Panel:  Recent Labs  09/12/13 0535 05/28/14 0305  HDL 46 28*   Cardiac Enzymes:  Recent Labs  05/27/14 2019 05/28/14 0028 05/28/14 0655  TROPONINI 1.19* 1.08* 0.86*   CBG:  Recent Labs  09/11/13 1303  GLUCAP 82   Ct Abdomen Pelvis W Contrast  05/27/2014   CLINICAL DATA:  Abdominal pain. Nausea, vomiting. Decreased p.o. intake and shortness of breath.  EXAM: CT ABDOMEN AND PELVIS WITH CONTRAST  TECHNIQUE: Multidetector CT imaging of the abdomen and pelvis was performed using the standard protocol following bolus administration of intravenous contrast.  CONTRAST:  74mL OMNIPAQUE IOHEXOL 300 MG/ML  SOLN  COMPARISON:  CT of the abdomen and pelvis 01/20/2013  FINDINGS:  Lower chest: There are fibrotic changes at the lung bases. Heart is markedly enlarged. Coronary artery and mitral annulus calcifications are present. No pericardial effusion.  Upper abdomen: There is a diffusely heterogeneous appearance of the liver without discrete lesions. Findings are consistent with hepatic congestion. No focal abnormality identified within the spleen, pancreas. Bilateral adrenal lesions appear stable over prior studies, measuring 1.4 cm on the right and 3.5 cm on the left. Gallbladder is markedly distended and measures 17 cm in length. There is no CT evidence for stones or pericholecystic fluid.  Gastrointestinal tract: The stomach  and small bowel loops are normal in caliber. There is significant stranding surrounding the ascending colon which contains numerous diverticula. Colonic wall in this region is thickened and associated with numerous diverticula. No evidence for perforation. The appendix is well seen and has a normal appearance.  Pelvis: The uterus is surgically absent. No adnexal mass. Adjacent to the left aspect of the base the bladder there is a small fluid-filled structure of likely representing a small bladder diverticulum.  Retroperitoneum: No retroperitoneal or mesenteric adenopathy.  Abdominal wall: There is diffuse body wall edema. 5.2 x 1.3 cm lipoma identified in the right mid abdominal subcutaneous tissues.  Osseous structures: Scoliosis. No suspicious lytic or blastic lesions are identified.  IMPRESSION: 1. Marked distension of the gallbladder. Ultrasound may be helpful for further evaluation regarding the duct and possible gallstones. 2. Hepatic congestion with diffusely mottled appearance of the liver parenchyma. 3. Stable bilateral adrenal nodules. 4. Significant diverticulosis. Inflammation associated with the ascending colon favored to be related to diverticulitis. Malignancy should also be considered and followup is recommended. Colonoscopy and/or contrast enema may  be helpful for further evaluation following clinical improvement. 5. Diffuse body wall edema. 6. Cardiomegaly.  Mitral annulus and coronary calcifications.   Electronically Signed   By: Rosalie Gums M.D.   On: 05/27/2014 13:39   US Abdomen Limited  05/27/2014   CLINICAL DATA:  Abdominal pain. Follow-up distended gallbladder on CT.  EXAM: US ABDOMEN LIMITED - RIGHT UPPER QUADRANT  COMPARISON:  CT of the abdomen and pelvis May 27, 2014 at 1303 hr  FINDINGS: Gallbladder:  Layering gallbladder sludge, tiny echogenic gallstones with acoustic shadowing. Gallbladder distention without wall thickening or pericholecystic fluid. No sonographic Murphy's sign elicited.  Common bile duct:  Diameter: 5 mm  Liver:  No focal lesion identified. Within normal limits in parenchymal echogenicity. Hepatopetal portal vein.  IMPRESSION: Distended gallbladder with sludge/ cholelithiasis, no superimposed findings of acute cholecystitis.   Electronically Signed   By: Awilda Metro   On: 05/27/2014 15:01   Ir Perc Cholecystostomy  05/28/2014   CLINICAL DATA:  81 year old female with aContinue amiodarone 200 mg by mouth dailycute calculus cholecystitis resulting in secondary demand ischemia. She is currently not a surgical candidate. Percutaneous cholecystostomy tube is warranted.  EXAM: CHOLECYSTOSTOMY  Date: 05/28/2014  PROCEDURE: 1. Percutaneous transhepatic cholecystostomy tube with ultrasound and fluoroscoConstipation constipationpic guidance Interventional Radiologist:  He protein calorie Narda Amber, MD  ANESTHESIA/SEDATION: Moderate (conscious) sedation was used. One mg Versed, 50 mcg Fentanyl were administered intravenously. The patient's vital signs were monitored continuously by radiology nursing throughout the procedure.  Sedation Time: 9 minutes  MEDICATIONS: 3.375 g Zosyn administered intravenously within 1 hr of skin incision  FLUOROSCOPY TIME:  1 min  12.9 mGy  CONTRAST:  10mL OMNIPAQUE IOHEXOL  300 MG/ML  SOLN  TECHNIQUE: Informed consent was obtained from the patient following explanation of the procedure, risks, benefits and alternatives. The patient understands, agrees and consents for the procedure. All questions were addressed. A time out was performed.  Maximal barrier sterile technique utilized including caps, mask, sterile gowns, sterile gloves, large sterile drape, hand hygiene, and Betadine skin prep.  The right upper quadrant was interrogated with ultrasound. A suitable skin entry site that would allow for a transhepatic course into the gallbladder lumen was selected and marked. Local anesthesia was attained by infiltration with 1% lidocaine. A small dermatotomy was made. Under real-time sonographic guidance, a 21 gauge micropuncture needle was advanced through a short transhepatic course and  into the gallbladder lumen. Removal of the stylet revealed free return of cloudy yellow bile. A gentle hand injection of contrast material confirmed opacification of the gallbladder lumen. The 0.018 inch wire was then coiled within the gallbladder lumen and the needle exchanged for the Accustick sheath.  The 0.018 inch wire was then exchanged for a short Amplatz wire. The tract was dilated to 10 [place] and a Cook 10.2 [place] all-purpose drainage catheter was advanced over the wire and formed within the gallbladder lumen. Approximately 120 mL of cloudy dark golden-brown bile was aspirated. A sample was sent for culture and sensitivities. The catheter was then flushed and connected to gravity bag drainage. The catheter was secured to the skin with 0 Prolene suture and a sterile bandage.  The patient tolerated the procedure well.  COMPLICATIONS: None  IMPRESSION: Successful placement of a 10 French transhepatic percutaneous cholecystostomy tube for acute calculus cholecystitis.  PLAN: 1. Maintain tube to gravity bag drainage. 2. May resume heparin in 2 hr. 3. Return to Interventional Radiology for percutaneous  cholecystostomy tube check and change in 6- 8 weeks if interval cholecystectomy has not been performed. Signed,  Sterling Big, MD  [facility]  [facility]   Electronically Signed   By: Malachy Moan M.D.   On: 05/28/2014 17:08   Dg Chest Port 1 View  05/27/2014   CLINICAL DATA:  Mid chest and abdominal pain since Saturday. Intermittent cough.  EXAM: PORTABLE CHEST - 1 VIEW  COMPARISON:  01/20/2013.  FINDINGS: Trachea is midline. Heart size stable. The loop recorder projects over the left heart. Linear scarring or thickening of the minor fissure in the mid right hemi thorax. Probable scarring at both lung bases. Lungs are otherwise clear. No pleural fluid. Right hemidiaphragm is rather elevated, as before. Calcifications in the right upper quadrant may represent gallstones.  IMPRESSION: No acute findings.   Electronically Signed   By: Leanna Battles M.D.   On: 05/27/2014 11:08   ASSESSMENT/PLAN:  Physical deconditioning - continue rehabilitation Chronic diastolic CHF -  Recently increased Lasix to 40 mg PO Q D, discontinude Lisinopriland decreased Metoprolol succinate 25 mg PO Q D Acute cholecystitis - S/P percutaneous drainage; monitor percutaneous drainage output  A. Fib with RVR - rate-controlled; continue Amiodarone 200 mg 1 tab PO Q D, Metoprolol succinate 25 mg PO Q D and Coumadin Constipation - continue Senna-S 2 tabs PO Q HS and Miralax 17 gm Q OD Protein-Calorie Malnutrition, severe - continue supplementation GERD - continue Omeprazole 20 mg PO Q D Dementia - continue Aricept 5 mg PO Q D Hyperlipidemia - continue Zocor 10 mg by mouth every evening Hypothyroidism - TSH 12.40; probably due to amiodarone and will repeat TSH in 1 month   Goals of care:  Short-term rehabilitation  Labs/test ordered:  TSH in 1 month   [facility], NP [facility] [phone]

## 2014-09-05 ENCOUNTER — Emergency Department (HOSPITAL_COMMUNITY)
Admission: EM | Admit: 2014-09-05 | Discharge: 2014-09-06 | Disposition: A | Discharge status: Home / Self Care | Payer: Medicare Other | Attending: Emergency Medicine | Admitting: Emergency Medicine

## 2014-09-05 ENCOUNTER — Encounter (HOSPITAL_COMMUNITY): Payer: Self-pay | Admitting: Emergency Medicine

## 2014-09-05 DIAGNOSIS — Z7901 Long term (current) use of anticoagulants: Secondary | ICD-10-CM | POA: Insufficient documentation

## 2014-09-05 DIAGNOSIS — Z8673 Personal history of transient ischemic attack (TIA), and cerebral infarction without residual deficits: Secondary | ICD-10-CM | POA: Insufficient documentation

## 2014-09-05 DIAGNOSIS — T85590D Other mechanical complication of bile duct prosthesis, subsequent encounter: Secondary | ICD-10-CM | POA: Diagnosis not present

## 2014-09-05 DIAGNOSIS — R5382 Chronic fatigue, unspecified: Secondary | ICD-10-CM | POA: Diagnosis not present

## 2014-09-05 DIAGNOSIS — K5900 Constipation, unspecified: Secondary | ICD-10-CM | POA: Insufficient documentation

## 2014-09-05 DIAGNOSIS — G8918 Other acute postprocedural pain: Secondary | ICD-10-CM | POA: Diagnosis not present

## 2014-09-05 DIAGNOSIS — Z79899 Other long term (current) drug therapy: Secondary | ICD-10-CM | POA: Diagnosis not present

## 2014-09-05 DIAGNOSIS — I2510 Atherosclerotic heart disease of native coronary artery without angina pectoris: Secondary | ICD-10-CM | POA: Diagnosis not present

## 2014-09-05 DIAGNOSIS — M1990 Unspecified osteoarthritis, unspecified site: Secondary | ICD-10-CM | POA: Diagnosis not present

## 2014-09-05 DIAGNOSIS — I50810 Right heart failure, unspecified: Secondary | ICD-10-CM

## 2014-09-05 DIAGNOSIS — Y848 Other medical procedures as the cause of abnormal reaction of the patient, or of later complication, without mention of misadventure at the time of the procedure: Secondary | ICD-10-CM | POA: Diagnosis not present

## 2014-09-05 DIAGNOSIS — R2240 Localized swelling, mass and lump, unspecified lower limb: Secondary | ICD-10-CM | POA: Insufficient documentation

## 2014-09-05 DIAGNOSIS — R1013 Epigastric pain: Secondary | ICD-10-CM

## 2014-09-05 DIAGNOSIS — Z7982 Long term (current) use of aspirin: Secondary | ICD-10-CM | POA: Insufficient documentation

## 2014-09-05 DIAGNOSIS — Z87891 Personal history of nicotine dependence: Secondary | ICD-10-CM | POA: Insufficient documentation

## 2014-09-05 DIAGNOSIS — T85520S Displacement of bile duct prosthesis, sequela: Secondary | ICD-10-CM

## 2014-09-05 DIAGNOSIS — F0390 Unspecified dementia without behavioral disturbance: Secondary | ICD-10-CM | POA: Insufficient documentation

## 2014-09-05 DIAGNOSIS — R1011 Right upper quadrant pain: Secondary | ICD-10-CM

## 2014-09-05 LAB — COMPREHENSIVE METABOLIC PANEL
ALBUMIN: 2.8 g/dL — AB (ref 3.5–5.2)
ALT: 26 U/L (ref 0–35)
ANION GAP: 13 (ref 5–15)
AST: 55 U/L — ABNORMAL HIGH (ref 0–37)
Alkaline Phosphatase: 115 U/L (ref 39–117)
BILIRUBIN TOTAL: 1.2 mg/dL (ref 0.3–1.2)
BUN: 51 mg/dL — ABNORMAL HIGH (ref 6–23)
CO2: 25 mmol/L (ref 19–32)
Calcium: 8.7 mg/dL (ref 8.4–10.5)
Chloride: 101 mmol/L (ref 96–112)
Creatinine, Ser: 1.04 mg/dL (ref 0.50–1.10)
GFR calc Af Amer: 56 mL/min — ABNORMAL LOW (ref >90)
GFR calc non Af Amer: 49 mL/min — ABNORMAL LOW (ref >90)
Glucose, Bld: 102 mg/dL — ABNORMAL HIGH (ref 70–99)
Potassium: 4.7 mmol/L (ref 3.5–5.1)
Sodium: 139 mmol/L (ref 135–145)
Total Protein: 7.6 g/dL (ref 6.0–8.3)

## 2014-09-05 LAB — LIPASE, BLOOD: LIPASE: 57 U/L (ref 11–59)

## 2014-09-05 LAB — CBC WITH DIFFERENTIAL/PLATELET
BASOS PCT: 0 % (ref 0–1)
Basophils Absolute: 0 10*3/uL (ref 0.0–0.1)
EOS ABS: 0 10*3/uL (ref 0.0–0.7)
Eosinophils Relative: 0 % (ref 0–5)
HCT: 48.2 % — ABNORMAL HIGH (ref 36.0–46.0)
Hemoglobin: 16.1 g/dL — ABNORMAL HIGH (ref 12.0–15.0)
LYMPHS PCT: 20 % (ref 12–46)
Lymphs Abs: 1.1 10*3/uL (ref 0.7–4.0)
MCH: 22.6 pg — AB (ref 26.0–34.0)
MCHC: 33.4 g/dL (ref 30.0–36.0)
MCV: 67.8 fL — ABNORMAL LOW (ref 78.0–100.0)
MONO ABS: 0.4 10*3/uL (ref 0.1–1.0)
Monocytes Relative: 8 % (ref 3–12)
Neutro Abs: 3.8 10*3/uL (ref 1.7–7.7)
Neutrophils Relative %: 72 % (ref 43–77)
Platelets: 149 10*3/uL — ABNORMAL LOW (ref 150–400)
RBC: 7.11 MIL/uL — ABNORMAL HIGH (ref 3.87–5.11)
RDW: 20.9 % — ABNORMAL HIGH (ref 11.5–15.5)
WBC: 5.3 10*3/uL (ref 4.0–10.5)

## 2014-09-05 MED ORDER — IOHEXOL 300 MG/ML  SOLN
25.0000 mL | Freq: Once | INTRAMUSCULAR | Status: AC | PRN
  Administered 2014-09-05: 25 mL via ORAL

## 2014-09-05 MED ORDER — SODIUM CHLORIDE 0.9 % IV BOLUS (SEPSIS)
1000.0000 mL | Freq: Once | INTRAVENOUS | Status: AC
  Administered 2014-09-06: 1000 mL via INTRAVENOUS

## 2014-09-05 MED ORDER — FENTANYL CITRATE 0.05 MG/ML IJ SOLN
25.0000 ug | Freq: Once | INTRAMUSCULAR | Status: AC
  Administered 2014-09-05: 25 ug via INTRAVENOUS
  Filled 2014-09-05: qty 2

## 2014-09-05 MED ORDER — SODIUM CHLORIDE 0.9 % IV BOLUS (SEPSIS)
500.0000 mL | Freq: Once | INTRAVENOUS | Status: AC
  Administered 2014-09-05: 500 mL via INTRAVENOUS

## 2014-09-05 NOTE — ED Notes (Signed)
Pt arrives from [facility] via EMS with c/o abdominal pain around surgical drain site, states it's been going on all week. Surgery with [facility] in January. Pt has drain in place, RUQ.

## 2014-09-05 NOTE — ED Provider Notes (Signed)
CSN: [id number]     Arrival date & time 09/05/14  2210 History   First MD Initiated Contact with Patient 09/05/14 2217     No chief complaint on file.    (Consider location/radiation/quality/duration/timing/severity/associated sxs/prior Treatment) Patient is a 81 y.o. female presenting with abdominal pain. The history is provided by the patient, medical records and the EMS personnel. No language interpreter was used.  Abdominal Pain Pain location:  Epigastric and RUQ Pain radiates to:  Does not radiate Pain severity:  Moderate Onset quality:  Gradual Timing:  Constant Progression:  Worsening Chronicity:  Recurrent Context: previous surgery   Relieved by:  Nothing Worsened by:  Nothing tried Ineffective treatments:  None tried Associated symptoms: anorexia, constipation, fatigue and nausea   Associated symptoms: no chest pain, no chills, no cough, no fever, no shortness of breath and no vomiting   Risk factors: being elderly and multiple surgeries     Past Medical History  Diagnosis Date  . Syncope   . Atypical chest pain     Nonobstructive catheterization; negative Myoview 2011  . CVA 11/23/2008  . HYPERTENSION 11/23/2008  . Coronary artery disease   . 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 Other primary cardiomyopathies   . Dementia 11/23/2008  . Non Q wave myocardial infarction hx    notes 09/11/2013  . GERD (gastroesophageal reflux disease)   . SEIZURE DISORDER 11/23/2008    "she's had blackouts so loop recorder placed" (09/11/2013)  . Arthritis     "hands, legs, back" (09/11/2013)  . Status post placement of implantable loop recorder    Past Surgical History  Procedure Laterality Date  . Loop recorder implant  ?2010  . Colonoscopy    . Eye surgery    . Cataract extraction w/ intraocular lens  implant, bilateral Bilateral   . Laparotomy  11/30/2011    Procedure: EXPLORATORY LAPAROTOMY;  Surgeon: Zenovia Jarred, MD;  Location: [facility] [facility];  Service: General;   Laterality: N/A;  Exploratory Laparotomy with Primary Repair Obturator Hernia  . Cardiac catheterization  10/2011  . Left heart catheterization with coronary angiogram N/A 11/26/2011    Procedure: LEFT HEART CATHETERIZATION WITH CORONARY ANGIOGRAM;  Surgeon: Clent Demark, MD;  Location: [facility] CATH LAB;  Service: Cardiovascular;  Laterality: N/A;   Family History  Problem Relation Age of Onset  . Heart failure Neg Hx   . Stroke    . Heart disease    . Thyroid disease Neg Hx    History  Substance Use Topics  . Smoking status: Former Smoker    Types: Cigarettes  . Smokeless tobacco: Never Used     Comment: 09/11/2013 "stopped smoking in the 1960's; only puffed occasionally when I did smoke; didn't smoke long"  . Alcohol Use: No   OB History    No data available     Review of Systems  Constitutional: Positive for fatigue. Negative for fever, chills and diaphoresis.  Respiratory: Negative for cough, chest tightness and shortness of breath.   Cardiovascular: Positive for leg swelling. Negative for chest pain.  Gastrointestinal: Positive for nausea, abdominal pain, constipation and anorexia. Negative for vomiting.  Neurological: Negative for weakness, light-headedness and headaches.  Psychiatric/Behavioral: Negative for confusion.  All other systems reviewed and are negative.     Allergies  Codeine  Home Medications   Prior to Admission medications   Medication Sig Start Date End Date Taking? Authorizing Provider  amiodarone (PACERONE) 200 MG tablet Take 1 tablet (200 mg total) by mouth  daily. 06/05/14   Charolette Forward, MD  aspirin EC 81 MG EC tablet Take 1 tablet (81 mg total) by mouth daily. 01/22/13   Charolette Forward, MD  feeding supplement (ENSURE COMPLETE) LIQD Take 237 mLs by mouth 2 (two) times daily between meals. 12/15/11   Charolette Forward, MD  ferrous gluconate (FERGON) 324 MG tablet Take 1 tablet (324 mg total) by mouth daily with breakfast. 09/13/13   Charolette Forward, MD   levothyroxine (SYNTHROID, LEVOTHROID) 25 MCG tablet Take 1 tablet (25 mcg total) by mouth daily before breakfast. 07/26/14   Renato Shin, MD  lisinopril (PRINIVIL,ZESTRIL) 5 MG tablet Take 2.5 mg by mouth daily.    Historical Provider, MD  metoprolol succinate (TOPROL XL) 50 MG 24 hr tablet Take 1 tablet (50 mg total) by mouth daily. Take with or immediately following a meal. 06/05/14   Charolette Forward, MD  omeprazole (PRILOSEC) 20 MG capsule Take 20 mg by mouth daily.    Historical Provider, MD  polyethylene glycol (MIRALAX / GLYCOLAX) packet Take 17 g by mouth every other day.     Historical Provider, MD  simvastatin (ZOCOR) 20 MG tablet Take 20 mg by mouth every evening.    Historical Provider, MD  Vitamin D, Ergocalciferol, (DRISDOL) 50000 UNITS CAPS capsule Take 1 capsule (50,000 Units total) by mouth every 7 (seven) days. 07/26/14   Renato Shin, MD  warfarin (COUMADIN) 2 MG tablet Take 1 tablet (2 mg total) by mouth daily. 06/05/14   Charolette Forward, MD   BP 113/87 mmHg  Pulse 98  Resp 18  SpO2 94%   Physical Exam  Constitutional: She appears well-developed and well-nourished. She appears ill. No distress. Nasal cannula in place.  HENT:  Head: Normocephalic and atraumatic.  Nose: Nose normal.  Mouth/Throat: Oropharynx is clear and moist. No oropharyngeal exudate.  Eyes: EOM are normal. Pupils are equal, round, and reactive to light.  Neck: Normal range of motion. Neck supple.  Cardiovascular: Normal rate, regular rhythm, normal heart sounds and intact distal pulses.   No murmur heard. Pulmonary/Chest: Effort normal and breath sounds normal. No respiratory distress. She has no wheezes. She exhibits no tenderness.  Abdominal: Soft. There is tenderness (RUQ and epigastric). There is no rebound and no guarding.  Tender to palpation of RUQ and epigastric area.  Biliary drain at RUQ, but sutures no longer in place.   Still actively draining bilious output.     Musculoskeletal: Normal range of  motion. She exhibits edema (bilateral pitting edema in lower extremities). She exhibits no tenderness.  Lymphadenopathy:    She has no cervical adenopathy.  Neurological: She is alert. No cranial nerve deficit. Coordination normal.  Skin: Skin is warm and dry. She is not diaphoretic.  Psychiatric: She has a normal mood and affect. Her behavior is normal. Judgment and thought content normal.  Nursing note and vitals reviewed.   ED Course  Procedures (including critical care time) Labs Review Labs Reviewed  CBC WITH DIFFERENTIAL/PLATELET - Abnormal; Notable for the following:    RBC 7.11 (*)    Hemoglobin 16.1 (*)    HCT 48.2 (*)    MCV 67.8 (*)    MCH 22.6 (*)    RDW 20.9 (*)    Platelets 149 (*)    All other components within normal limits  COMPREHENSIVE METABOLIC PANEL - Abnormal; Notable for the following:    Glucose, Bld 102 (*)    BUN 51 (*)    Albumin 2.8 (*)    AST  55 (*)    GFR calc non Af Amer 49 (*)    GFR calc Af Amer 56 (*)    All other components within normal limits  LIPASE, BLOOD  URINALYSIS, ROUTINE W REFLEX MICROSCOPIC    Imaging Review No results found.   EKG Interpretation None      MDM   Final diagnoses:  RUQ pain  Epigastric pain  Biliary drain displacement, sequela  Chronic fatigue   Pt is a 82 yo F with hx of CAD, CVA, seizures, HTN , AFib (on coumadin), dementia, and cholecystectomy in January, with percutaneous biliary drain who presents with 1 week of RUQ and epigastric abd pain.  There is some concern that her percutaneous biliary drain has been dislodged, as the sutures are no longer in place and she developed Abd pain.  She denies fever, chills, or vomiting.  Has minimal appetite at baseline.  Is not ambulatory at baseline.   Perc GB tube looks like it is no longer sutured in place (dislodged suture material is seen approximately 1 inch down the tube, not sutured to the skin).   Significant amount of bilious drainage in the bag, however,  which appears to be actively draining.  She is tender to palpation around the RUQ and epigastric area diffusely. Lower extremity 1+ pitting edema bilaterally, which she says is her baseline.    Will need to evaluate to ensure the drain is in appropriate position and there is no surrounding abscess, etc.  CT abd/pelvis with contrast ordered.  Labs sent, including lipase.   WBC benign. Hb bumped from 10.6 to 16, concerning for dehydration.  NS bolus given.   BMP shows elevated BUN but Cr only 1.04 (baseline around 0.8).  Mild bump in AST but alk phos and T bili ok.    CT abd/pelvis with contrast ordered and is pending.   Plan to f/u CT scan and discuss with surgery if there is a fluid collection or suggestion that the drain is no longer in the correct position.  Ok to [employer] home to the SNF if the imaging is benign.  Vitals have been stable. Patient has been rehydrated and she has remained in NAD throughout her several hour ED course.  No indication that pain control will be a problem.     Patient was seen with ED Attending, Dr. Alfonse Spruce, MD   Tori Milks, MD 09/06/14 2505  Veryl Speak, MD 09/07/14 [phone]

## 2014-09-05 NOTE — ED Notes (Signed)
Nephrostomy Exiting RUQ.

## 2014-09-05 NOTE — ED Notes (Signed)
Attempted IV x1, unsuccessful.  

## 2014-09-06 ENCOUNTER — Emergency Department (HOSPITAL_COMMUNITY): Payer: Medicare Other

## 2014-09-06 ENCOUNTER — Ambulatory Visit (INDEPENDENT_AMBULATORY_CARE_PROVIDER_SITE_OTHER): Payer: Medicare Other | Admitting: Endocrinology

## 2014-09-06 ENCOUNTER — Encounter (HOSPITAL_COMMUNITY): Payer: Self-pay | Admitting: Radiology

## 2014-09-06 ENCOUNTER — Non-Acute Institutional Stay (SKILLED_NURSING_FACILITY): Payer: Medicare Other | Admitting: Internal Medicine

## 2014-09-06 ENCOUNTER — Encounter: Payer: Self-pay | Admitting: Internal Medicine

## 2014-09-06 DIAGNOSIS — R1114 Bilious vomiting: Secondary | ICD-10-CM

## 2014-09-06 DIAGNOSIS — R635 Abnormal weight gain: Secondary | ICD-10-CM | POA: Diagnosis not present

## 2014-09-06 DIAGNOSIS — K219 Gastro-esophageal reflux disease without esophagitis: Secondary | ICD-10-CM | POA: Diagnosis not present

## 2014-09-06 DIAGNOSIS — K5900 Constipation, unspecified: Secondary | ICD-10-CM | POA: Diagnosis not present

## 2014-09-06 DIAGNOSIS — E039 Hypothyroidism, unspecified: Secondary | ICD-10-CM | POA: Diagnosis not present

## 2014-09-06 DIAGNOSIS — D509 Iron deficiency anemia, unspecified: Secondary | ICD-10-CM

## 2014-09-06 DIAGNOSIS — R1013 Epigastric pain: Secondary | ICD-10-CM | POA: Diagnosis not present

## 2014-09-06 DIAGNOSIS — F0390 Unspecified dementia without behavioral disturbance: Secondary | ICD-10-CM | POA: Diagnosis not present

## 2014-09-06 DIAGNOSIS — I482 Chronic atrial fibrillation, unspecified: Secondary | ICD-10-CM

## 2014-09-06 DIAGNOSIS — E079 Disorder of thyroid, unspecified: Secondary | ICD-10-CM | POA: Diagnosis not present

## 2014-09-06 DIAGNOSIS — I509 Heart failure, unspecified: Secondary | ICD-10-CM | POA: Diagnosis not present

## 2014-09-06 MED ORDER — IOHEXOL 300 MG/ML  SOLN
80.0000 mL | Freq: Once | INTRAMUSCULAR | Status: AC | PRN
  Administered 2014-09-06: 80 mL via INTRAVENOUS

## 2014-09-06 MED ORDER — FUROSEMIDE 20 MG PO TABS
20.0000 mg | ORAL_TABLET | Freq: Once | ORAL | Status: AC
  Administered 2014-09-06: 20 mg via ORAL
  Filled 2014-09-06: qty 1

## 2014-09-06 MED ORDER — FUROSEMIDE 20 MG PO TABS: 20.0000 mg | ORAL_TABLET | Freq: Every day | ORAL | Status: AC

## 2014-09-06 NOTE — Discharge Instructions (Signed)
Abdominal Pain Many things can cause abdominal pain. Usually, abdominal pain is not caused by a disease and will improve without treatment. It can often be observed and treated at home. Your health care provider will do a physical exam and possibly order blood tests and X-rays to help determine the seriousness of your pain. However, in many cases, more time must pass before a clear cause of the pain can be found. Before that point, your health care provider may not know if you need more testing or further treatment. HOME CARE INSTRUCTIONS  Monitor your abdominal pain for any changes. The following actions may help to alleviate any discomfort you are experiencing:  Only take over-the-counter or prescription medicines as directed by your health care provider.  Do not take laxatives unless directed to do so by your health care provider.  Try a clear liquid diet (broth, tea, or water) as directed by your health care provider. Slowly move to a bland diet as tolerated. SEEK MEDICAL CARE IF:  You have unexplained abdominal pain.  You have abdominal pain associated with nausea or diarrhea.  You have pain when you urinate or have a bowel movement.  You experience abdominal pain that wakes you in the night.  You have abdominal pain that is worsened or improved by eating food.  You have abdominal pain that is worsened with eating fatty foods.  You have a fever. SEEK IMMEDIATE MEDICAL CARE IF:   Your pain does not go away within 2 hours.  You keep throwing up (vomiting).  Your pain is felt only in portions of the abdomen, such as the right side or the left lower portion of the abdomen.  You pass bloody or black tarry stools. MAKE SURE YOU:  Understand these instructions.   Will watch your condition.   Will get help right away if you are not doing well or get worse.  Document Released: 02/24/2005 Document Revised: 05/22/2013 Document Reviewed: 01/24/2013 [facility] Patient Information  2015 [place], [place]. This information is not intended to replace advice given to you by your health care provider. Make sure you discuss any questions you have with your health care provider.  Heart Failure Heart failure is a condition in which the heart has trouble pumping blood. This means your heart does not pump blood efficiently for your body to work well. In some cases of heart failure, fluid may back up into your lungs or you may have swelling (edema) in your lower legs. Heart failure is usually a long-term (chronic) condition. It is important for you to take good care of yourself and follow your health care provider's treatment plan. CAUSES  Some health conditions can cause heart failure. Those health conditions include:  High blood pressure (hypertension). Hypertension causes the heart muscle to work harder than normal. When pressure in the blood vessels is high, the heart needs to pump (contract) with more force in order to circulate blood throughout the body. High blood pressure eventually causes the heart to become stiff and weak.  Coronary artery disease (CAD). CAD is the buildup of cholesterol and fat (plaque) in the arteries of the heart. The blockage in the arteries deprives the heart muscle of oxygen and blood. This can cause chest pain and may lead to a heart attack. High blood pressure can also contribute to CAD.  Heart attack (myocardial infarction). A heart attack occurs when one or more arteries in the heart become blocked. The loss of oxygen damages the muscle tissue of the heart. When  this happens, part of the heart muscle dies. The injured tissue does not contract as well and weakens the heart's ability to pump blood.  Abnormal heart valves. When the heart valves do not open and close properly, it can cause heart failure. This makes the heart muscle pump harder to keep the blood flowing.  Heart muscle disease (cardiomyopathy or myocarditis). Heart muscle disease is damage to  the heart muscle from a variety of causes. These can include drug or alcohol abuse, infections, or unknown reasons. These can increase the risk of heart failure.  Lung disease. Lung disease makes the heart work harder because the lungs do not work properly. This can cause a strain on the heart, leading it to fail.  Diabetes. Diabetes increases the risk of heart failure. High blood sugar contributes to high fat (lipid) levels in the blood. Diabetes can also cause slow damage to tiny blood vessels that carry important nutrients to the heart muscle. When the heart does not get enough oxygen and food, it can cause the heart to become weak and stiff. This leads to a heart that does not contract efficiently.  Other conditions can contribute to heart failure. These include abnormal heart rhythms, thyroid problems, and low blood counts (anemia). Certain unhealthy behaviors can increase the risk of heart failure, including:  Being overweight.  Smoking or chewing tobacco.  Eating foods high in fat and cholesterol.  Abusing illicit drugs or alcohol.  Lacking physical activity. SYMPTOMS  Heart failure symptoms may vary and can be hard to detect. Symptoms may include:  Shortness of breath with activity, such as climbing stairs.  Persistent cough.  Swelling of the feet, ankles, legs, or abdomen.  Unexplained weight gain.  Difficulty breathing when lying flat (orthopnea).  Waking from sleep because of the need to sit up and get more air.  Rapid heartbeat.  Fatigue and loss of energy.  Feeling light-headed, dizzy, or close to fainting.  Loss of appetite.  Nausea.  Increased urination during the night (nocturia). DIAGNOSIS  A diagnosis of heart failure is based on your history, symptoms, physical examination, and diagnostic tests. Diagnostic tests for heart failure may include:  Echocardiography.  Electrocardiography.  Chest X-ray.  Blood tests.  Exercise stress test.  Cardiac  angiography.  Radionuclide scans. TREATMENT  Treatment is aimed at managing the symptoms of heart failure. Medicines, behavioral changes, or surgical intervention may be necessary to treat heart failure.  Medicines to help treat heart failure may include:  Angiotensin-converting enzyme (ACE) inhibitors. This type of medicine blocks the effects of a blood protein called angiotensin-converting enzyme. ACE inhibitors relax (dilate) the blood vessels and help lower blood pressure.  Angiotensin receptor blockers (ARBs). This type of medicine blocks the actions of a blood protein called angiotensin. Angiotensin receptor blockers dilate the blood vessels and help lower blood pressure.  Water pills (diuretics). Diuretics cause the kidneys to remove salt and water from the blood. The extra fluid is removed through urination. This loss of extra fluid lowers the volume of blood the heart pumps.  Beta blockers. These prevent the heart from beating too fast and improve heart muscle strength.  Digitalis. This increases the force of the heartbeat.  Healthy behavior changes include:  Obtaining and maintaining a healthy weight.  Stopping smoking or chewing tobacco.  Eating heart-healthy foods.  Limiting or avoiding alcohol.  Stopping illicit drug use.  Physical activity as directed by your health care provider.  Surgical treatment for heart failure may include:  A procedure  to open blocked arteries, repair damaged heart valves, or remove damaged heart muscle tissue.  A pacemaker to improve heart muscle function and control certain abnormal heart rhythms.  An internal cardioverter defibrillator to treat certain serious abnormal heart rhythms.  A left ventricular assist device (LVAD) to assist the pumping ability of the heart. HOME CARE INSTRUCTIONS   Take medicines only as directed by your health care provider. Medicines are important in reducing the workload of your heart, slowing the  progression of heart failure, and improving your symptoms.  Do not stop taking your medicine unless directed by your health care provider.  Do not skip any dose of medicine.  Refill your prescriptions before you run out of medicine. Your medicines are needed every day.  Engage in moderate physical activity if directed by your health care provider. Moderate physical activity can benefit some people. The elderly and people with severe heart failure should consult with a health care provider for physical activity recommendations.  Eat heart-healthy foods. Food choices should be free of trans fat and low in saturated fat, cholesterol, and salt (sodium). Healthy choices include fresh or frozen fruits and vegetables, fish, lean meats, legumes, fat-free or low-fat dairy products, and whole grain or high fiber foods. Talk to a dietitian to learn more about heart-healthy foods.  Limit sodium if directed by your health care provider. Sodium restriction may reduce symptoms of heart failure in some people. Talk to a dietitian to learn more about heart-healthy seasonings.  Use healthy cooking methods. Healthy cooking methods include roasting, grilling, broiling, baking, poaching, steaming, or stir-frying. Talk to a dietitian to learn more about healthy cooking methods.  Limit fluids if directed by your health care provider. Fluid restriction may reduce symptoms of heart failure in some people.  Weigh yourself every day. Daily weights are important in the early recognition of excess fluid. You should weigh yourself every morning after you urinate and before you eat breakfast. Wear the same amount of clothing each time you weigh yourself. Record your daily weight. Provide your health care provider with your weight record.  Monitor and record your blood pressure if directed by your health care provider.  Check your pulse if directed by your health care provider.  Lose weight if directed by your health care  provider. Weight loss may reduce symptoms of heart failure in some people.  Stop smoking or chewing tobacco. Nicotine makes your heart work harder by causing your blood vessels to constrict. Do not use nicotine gum or patches before talking to your health care provider.  Keep all follow-up visits as directed by your health care provider. This is important.  Limit alcohol intake to no more than 1 drink per day for nonpregnant women and 2 drinks per day for men. One drink equals 12 ounces of beer, 5 ounces of wine, or 1 ounces of hard liquor. Drinking more than that is harmful to your heart. Tell your health care provider if you drink alcohol several times a week. Talk with your health care provider about whether alcohol is safe for you. If your heart has already been damaged by alcohol or you have severe heart failure, drinking alcohol should be stopped completely.  Stop illicit drug use.  Stay up-to-date with immunizations. It is especially important to prevent respiratory infections through current pneumococcal and influenza immunizations.  Manage other health conditions such as hypertension, diabetes, thyroid disease, or abnormal heart rhythms as directed by your health care provider.  Learn to manage stress.  Plan rest periods when fatigued.  Learn strategies to manage high temperatures. If the weather is extremely hot:  Avoid vigorous physical activity.  Use air conditioning or fans or seek a cooler location.  Avoid caffeine and alcohol.  Wear loose-fitting, lightweight, and light-colored clothing.  Learn strategies to manage cold temperatures. If the weather is extremely cold:  Avoid vigorous physical activity.  Layer clothes.  Wear mittens or gloves, a hat, and a scarf when going outside.  Avoid alcohol.  Obtain ongoing education and support as needed.  Participate in or seek rehabilitation as needed to maintain or improve independence and quality of life. SEEK MEDICAL  CARE IF:   Your weight increases by 03 lb/1.4 kg in 1 day or 05 lb/2.3 kg in a week.  You have increasing shortness of breath that is unusual for you.  You are unable to participate in your usual physical activities.  You tire easily.  You cough more than normal, especially with physical activity.  You have any or more swelling in areas such as your hands, feet, ankles, or abdomen.  You are unable to sleep because it is hard to breathe.  You feel like your heart is beating fast (palpitations).  You become dizzy or light-headed upon standing up. SEEK IMMEDIATE MEDICAL CARE IF:   You have difficulty breathing.  There is a change in mental status such as decreased alertness or difficulty with concentration.  You have a pain or discomfort in your chest.  You have an episode of fainting (syncope). MAKE SURE YOU:   Understand these instructions.  Will watch your condition.  Will get help right away if you are not doing well or get worse. Document Released: 05/17/2005 Document Revised: 10/01/2013 Document Reviewed: 06/16/2012 [facility] Patient Information 2015 [place], [place]. This information is not intended to replace advice given to you by your health care provider. Make sure you discuss any questions you have with your health care provider.

## 2014-09-06 NOTE — ED Provider Notes (Signed)
Pt care assumed from prior team awaiting CT scan abd for possible dislodged biliary drain.    Results for orders placed or performed during the hospital encounter of 09/05/14  CBC with Differential  Result Value Ref Range   WBC 5.3 4.0 - 10.5 K/uL   RBC 7.11 (H) 3.87 - 5.11 MIL/uL   Hemoglobin 16.1 (H) 12.0 - 15.0 g/dL   HCT 16.1 (H) 09.6 - 04.5 %   MCV 67.8 (L) 78.0 - 100.0 fL   MCH 22.6 (L) 26.0 - 34.0 pg   MCHC 33.4 30.0 - 36.0 g/dL   RDW 40.9 (H) 81.1 - 91.4 %   Platelets 149 (L) 150 - 400 K/uL   Neutrophils Relative % 72 43 - 77 %   Lymphocytes Relative 20 12 - 46 %   Monocytes Relative 8 3 - 12 %   Eosinophils Relative 0 0 - 5 %   Basophils Relative 0 0 - 1 %   Neutro Abs 3.8 1.7 - 7.7 K/uL   Lymphs Abs 1.1 0.7 - 4.0 K/uL   Monocytes Absolute 0.4 0.1 - 1.0 K/uL   Eosinophils Absolute 0.0 0.0 - 0.7 K/uL   Basophils Absolute 0.0 0.0 - 0.1 K/uL   RBC Morphology POLYCHROMASIA PRESENT    Smear Review LARGE PLATELETS PRESENT   Comprehensive metabolic panel  Result Value Ref Range   Sodium 139 135 - 145 mmol/L   Potassium 4.7 3.5 - 5.1 mmol/L   Chloride 101 96 - 112 mmol/L   CO2 25 19 - 32 mmol/L   Glucose, Bld 102 (H) 70 - 99 mg/dL   BUN 51 (H) 6 - 23 mg/dL   Creatinine, Ser 7.82 0.50 - 1.10 mg/dL   Calcium 8.7 8.4 - 95.6 mg/dL   Total Protein 7.6 6.0 - 8.3 g/dL   Albumin 2.8 (L) 3.5 - 5.2 g/dL   AST 55 (H) 0 - 37 U/L   ALT 26 0 - 35 U/L   Alkaline Phosphatase 115 39 - 117 U/L   Total Bilirubin 1.2 0.3 - 1.2 mg/dL   GFR calc non Af Amer 49 (L) >90 mL/min   GFR calc Af Amer 56 (L) >90 mL/min   Anion gap 13 5 - 15  Lipase, blood  Result Value Ref Range   Lipase 57 11 - 59 U/L   Ct Abdomen Pelvis W Contrast  09/06/2014   CLINICAL DATA:  Right upper quadrant abdominal pain for 2 weeks. Nausea and vomiting for 4 days. Patient had a gallbladder drain placed in the beginning of the year.  EXAM: CT ABDOMEN AND PELVIS WITH CONTRAST  TECHNIQUE: Multidetector CT imaging of the  abdomen and pelvis was performed using the standard protocol following bolus administration of intravenous contrast.  CONTRAST:  80mL OMNIPAQUE IOHEXOL 300 MG/ML  SOLN  COMPARISON:  05/07/2014  FINDINGS: Moderate left and small right pleural effusions with consolidation in both lung bases, also greater on the left. Changes may represent pneumonia. Diffuse cardiac enlargement with predominant dilatation of the right heart and left atrium.  Reflux of contrast material into the hepatic veins consistent with right heart failure and passive hepatic congestion. Associated heterogeneous enhancement pattern to the liver. No focal liver lesions identified. Cholecystostomy tube remains in place with decompression of the gallbladder. No bile duct dilatation. Pancreas, spleen, kidneys, inferior vena cava, and retroperitoneal lymph nodes are unremarkable. Large fat attenuation lesion probably arising from the anterior aspect of the left adrenal gland, measuring 1.7 x 3 cm diameter. This probably  represents an adenoma. Stomach, small bowel, and colon are decompressed. Contrast material flows through to the colon suggesting no evidence of bowel obstruction. Small amount of free fluid demonstrated in the pericolic gutters bilaterally. Diffuse edema throughout the subcutaneous fat and mesenteric suggesting anasarca. No free air in the abdomen.  Pelvis: Bladder wall is not thickened. Uterus appears surgically absent. No pelvic mass or lymphadenopathy. Free fluid in the pelvis is likely extending down from the abdomen. Ascites and edema are new since previous study. Diverticulosis of the sigmoid colon without evidence of diverticulitis. Appendix is not identified. Degenerative changes in the spine and hips. No destructive bone lesions appreciated.  IMPRESSION: Interval development of bilateral pleural effusions with bilateral basilar consolidation, worse on the left. Interval development of small amount of abdominal and pelvic ascites  with diffuse edema throughout the subcutaneous fat and mesenteric fat. Chronic right heart failure with passive congestion of the liver. Left adrenal gland nodule. Cholecystostomy tube with decompression of the gallbladder. No bile duct dilatation.   Electronically Signed   By: Burman Nieves M.D.   On: 09/06/2014 02:18    CT scan with bilateral pleural effusions, ascites, congestion of liver.  Case d/w Dr Sharyn Lull who recommends restarting on low dose lasix.  Family at bedside updated on findings and plan.  Marisa Severin, MD 09/06/14 [phone]

## 2014-09-06 NOTE — Progress Notes (Signed)
Patient ID: [name], female   DOB: Jul 11, 1929, 81 y.o.   MRN: [id number]    [facility]  Chief Complaint  Patient presents with  . Medical Management of Chronic Issues   Allergies  Allergen Reactions  . Codeine Other (See Comments)    UNKNOWN; Per MAR   HPI:   81 year old patient is seen for monthly routine visit. She was seen in the ED last night for increased edema. She had ct abdomen showing increase pleural effusion, ascites. She is seen in her room today. She had vomiting x 1 today, bilious. She complaints of being tired and nauseous. Denies abdominal pain. Had bowel movement yesterday.  She continues to have her a percutaneous drainage tube, draining well. She was seen by endocrinology in feb for hypothyroidism and started on levothyroxine but as pt did not come back to facility with any written orders/ scripts or AVS, it has not been given. She is to follow with endocrinology this afternoon.  She has PMH of CHF, HTN, HLD, OA, GERD, anemia and dementia.  Review of Systems:  Constitutional: Negative for fever, chills, diaphoresis.  HENT: Negative for headache, congestion Eyes: Negative for eye pain, blurred vision, double vision and discharge.  Respiratory: Negative for cough, shortness of breath and wheezing.   Cardiovascular: Negative for chest pain, palpitations. Positive for leg swelling.  Gastrointestinal: Negative for heartburn, nausea, vomiting, abdominal pain. Genitourinary: Negative for dysuria and flank pain.  Musculoskeletal: Negative for falls Skin: Negative for itching, rash.  Neurological: Negative for dizziness, tingling, focal weakness Psychiatric/Behavioral: Negative for depression, anxiety. Has hx of dementia   Past Medical History  Diagnosis Date  . Syncope   . Atypical chest pain     Nonobstructive catheterization; negative Myoview 2011  . CVA 11/23/2008  . HYPERTENSION 11/23/2008  . Coronary artery disease   .  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 Other primary cardiomyopathies   . Dementia 11/23/2008  . Non Q wave myocardial infarction hx    notes 09/11/2013  . GERD (gastroesophageal reflux disease)   . SEIZURE DISORDER 11/23/2008    "she's had blackouts so loop recorder placed" (09/11/2013)  . Arthritis     "hands, legs, back" (09/11/2013)  . Status post placement of implantable loop recorder      Medication List       This list is accurate as of: 09/06/14  3:48 PM.  Always use your most recent med list.               amiodarone 200 MG tablet  Commonly known as:  PACERONE  Take 1 tablet (200 mg total) by mouth daily.     aspirin 81 MG EC tablet  Take 1 tablet (81 mg total) by mouth daily.     bisacodyl 5 MG EC tablet  Commonly known as:  DULCOLAX  Take 5 mg by mouth daily as needed for moderate constipation.     DECUBI-VITE Caps  Take 1 capsule by mouth daily.     donepezil 5 MG tablet  Commonly known as:  ARICEPT  Take 5 mg by mouth at bedtime.     feeding supplement (ENSURE COMPLETE) Liqd  Take 237 mLs by mouth 2 (two) times daily between meals.     ferrous sulfate 325 (65 FE) MG EC tablet  Take 325 mg by mouth daily with breakfast.     furosemide 20 MG tablet  Commonly known as:  LASIX  Take 1 tablet (  20 mg total) by mouth daily.     levothyroxine 25 MCG tablet  Commonly known as:  SYNTHROID, LEVOTHROID  Take 1 tablet (25 mcg total) by mouth daily before breakfast.     metoprolol succinate 50 MG 24 hr tablet  Commonly known as:  TOPROL XL  Take 1 tablet (50 mg total) by mouth daily. Take with or immediately following a meal.     omeprazole 20 MG capsule  Commonly known as:  PRILOSEC  Take 20 mg by mouth daily.     polyethylene glycol packet  Commonly known as:  MIRALAX / GLYCOLAX  Take 17 g by mouth every other day.     sennosides-docusate sodium 8.6-50 MG tablet  Commonly known as:  SENOKOT-S  Take 2 tablets by mouth at bedtime.     simvastatin  20 MG tablet  Commonly known as:  ZOCOR  Take 10 mg by mouth every morning.     warfarin 1 MG tablet  Commonly known as:  COUMADIN  Take 1 mg by mouth daily. Currently on hold       Physical exam BP 114/80 mmHg  Pulse 80  Temp(Src) 98.9 F (37.2 C)  Resp 18  Wt 134 lb 9.6 oz (61.054 kg)  SpO2 96%  Wt Readings from Last 3 Encounters:  09/06/14 134 lb 9.6 oz (61.054 kg)  08/16/14 130 lb 4.8 oz (59.104 kg)  07/01/14 118 lb 12.8 oz (53.887 kg)   General- elderly female, thin and frail, in no acute distress, muscle wasting noted Head- normocephalic, atraumatic Throat- moist mucus membrane Eyes- PERRLA, EOMI, no pallor, no icterus, no discharge, normal conjunctiva, normal sclera Neck- no cervical lymphadenopathy Cardiovascular- irregular heart rate, no murmurs, palpable dorsalis pedis and radial pulses, 2+ leg edema Respiratory- decreased air entry at bases, no wheeze, no rhonchi, no crackles, no use of accessory muscles Abdomen- bowel sounds present, soft, non tender, biliary drain on right side with suture in place, dressing clean and dry   Musculoskeletal- able to move all 4 extremities, generalized weakness Neurological- no focal deficit, oriented to self and can answer questions Skin- warm and dry  Labs  Lab Results  Component Value Date   TSH 12.40* 07/26/2014   09/03/14 na 138, k 4.6, bun 52, cr 0.91, ca 8  CMP Latest Ref Rng 09/05/2014 07/26/2014 06/05/2014  Glucose 70 - 99 mg/dL 161(W) - 90  BUN 6 - 23 mg/dL 96(E) - 13  Creatinine 0.50 - 1.10 mg/dL 4.54 - 0.98  Sodium 119 - 145 mmol/L 139 - 140  Potassium 3.5 - 5.1 mmol/L 4.7 - 4.1  Chloride 96 - 112 mmol/L 101 - 106  CO2 19 - 32 mmol/L 25 - 24  Calcium 8.4 - 10.5 mg/dL 8.7 1.4(N) 8.4  Total Protein 6.0 - 8.3 g/dL 7.6 - -  Total Bilirubin 0.3 - 1.2 mg/dL 1.2 - -  Alkaline Phos 39 - 117 U/L 115 - -  AST 0 - 37 U/L 55(H) - -  ALT 0 - 35 U/L 26 - -   CBC Latest Ref Rng 09/05/2014 06/05/2014 06/04/2014  WBC 4.0 - 10.5  K/uL 5.3 8.1 9.1  Hemoglobin 12.0 - 15.0 g/dL 16.1(H) 10.6(L) 10.2(L)  Hematocrit 36.0 - 46.0 % 48.2(H) 32.6(L) 31.7(L)  Platelets 150 - 400 K/uL 149(L) PLATELET CLUMPS NOTED ON SMEAR, COUNT APPEARS ADEQUATE 298    Assessment/plan  Nausea and vomiting Bilious vomiting. Has biliary drain draining well for now. Will have her on prn phenergan with meals. Change diet  to liquid diet for lunch and advance diet as tolerated. Abdomen soft on exam, no signs of abdominal obstruction. lft from yesterday unremarkable. Check cbc with diff, cmp, amylase, lipase tomorrow  Chronic cholecystitis Has biliary drain in place, f/u cholangiogram showed cystic duct to be occluded and has drain in place for now, has f/u with surgery  Hypothyroidism Start levothyroxine 25 mcg daily from tomorrow am. Sent note to Dr Everardo All about this. Check thyroid panel in 8 weeks  Weight gain Of 3 lbs, her hypothyroidism and chf could be contributing to this, continue lasix 40 mg daily, start levothyroxine, monitor weight  chf Monitor daily weight, continue toprol, lasix and monitor bmp  afib Rate controlled, continue toprol xl, amiodarone and coumadin. Coumadin on hold for now with supratherapeutic inr. Monitor inr  Iron def anemia Continue iron supplement, with hb of 16 in ED., recheck cbc in am, if > 13, d/c iron supplement  Constipation Continue senokot-s, miralax and dulcolax suppository  Dementia Continue aricept for now  gerd Continue prilosec

## 2014-09-06 NOTE — Patient Instructions (Addendum)
Please start levothyroxine, 25 mcg po daily. Also, please start ergocalciferol, 50,000 units po q week. In 6 weeks, please check: PTH, Ca++, 25-OH vitamin-D, and TSH.   Please forward results here.

## 2014-09-06 NOTE — Progress Notes (Signed)
Subjective:    Patient ID: [name], female    DOB: Jul 19, 1929, 81 y.o.   MRN: [id number]  HPI Pt returns for f/u of hypothyroidism (dx'ed 2016; she has been on amiodarone since late 2015; she also had a GB drain placed in late 2015; she has never had thyroid imaging).  she has never been on prescribed thyroid hormone therapy.  Hx is from pt and dtr. She was seen in ER yesterday, for unrelated problem.  Pt reports slight pain at the abdomen, and assoc fatigue. Past Medical History  Diagnosis Date  . Syncope   . Atypical chest pain     Nonobstructive catheterization; negative Myoview 2011  . CVA 11/23/2008  . HYPERTENSION 11/23/2008  . Coronary artery disease   . 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 Other primary cardiomyopathies   . Dementia 11/23/2008  . Non Q wave myocardial infarction hx    notes 09/11/2013  . GERD (gastroesophageal reflux disease)   . SEIZURE DISORDER 11/23/2008    "she's had blackouts so loop recorder placed" (09/11/2013)  . Arthritis     "hands, legs, back" (09/11/2013)  . Status post placement of implantable loop recorder     Past Surgical History  Procedure Laterality Date  . Loop recorder implant  ?2010  . Colonoscopy    . Eye surgery    . Cataract extraction w/ intraocular lens  implant, bilateral Bilateral   . Laparotomy  11/30/2011    Procedure: EXPLORATORY LAPAROTOMY;  Surgeon: Liz Malady, MD;  Location: [facility] OR;  Service: General;  Laterality: N/A;  Exploratory Laparotomy with Primary Repair Obturator Hernia  . Cardiac catheterization  10/2011  . Left heart catheterization with coronary angiogram N/A 11/26/2011    Procedure: LEFT HEART CATHETERIZATION WITH CORONARY ANGIOGRAM;  Surgeon: Robynn Pane, MD;  Location: [facility] CATH LAB;  Service: Cardiovascular;  Laterality: N/A;    History   Social History  . Marital Status: Widowed    Spouse Name: N/A  . Number of Children: N/A  . Years of Education: N/A   Occupational History  .  sitter    Social History Main Topics  . Smoking status: Former Smoker    Types: Cigarettes  . Smokeless tobacco: Never Used     Comment: 09/11/2013 "stopped smoking in the 1960's; only puffed occasionally when I did smoke; didn't smoke long"  . Alcohol Use: No  . Drug Use: No  . Sexual Activity: No   Other Topics Concern  . Not on file   Social History Narrative   Widowed    Current Outpatient Prescriptions on File Prior to Visit  Medication Sig Dispense Refill  . amiodarone (PACERONE) 200 MG tablet Take 1 tablet (200 mg total) by mouth daily. 30 tablet 3  . aspirin EC 81 MG EC tablet Take 1 tablet (81 mg total) by mouth daily. 30 tablet 3  . bisacodyl (DULCOLAX) 5 MG EC tablet Take 5 mg by mouth daily as needed for moderate constipation.    Marland Kitchen donepezil (ARICEPT) 5 MG tablet Take 5 mg by mouth at bedtime.    . feeding supplement (ENSURE COMPLETE) LIQD Take 237 mLs by mouth 2 (two) times daily between meals. 60 Bottle 1  . ferrous sulfate 325 (65 FE) MG EC tablet Take 325 mg by mouth daily with breakfast.    . furosemide (LASIX) 20 MG tablet Take 1 tablet (20 mg total) by mouth daily. (Patient taking differently: Take 40 mg by mouth daily. ) 30  tablet 0  . levothyroxine (SYNTHROID, LEVOTHROID) 25 MCG tablet Take 1 tablet (25 mcg total) by mouth daily before breakfast. (Patient not taking: Reported on 09/06/2014) 30 tablet 1  . metoprolol succinate (TOPROL XL) 50 MG 24 hr tablet Take 1 tablet (50 mg total) by mouth daily. Take with or immediately following a meal. (Patient taking differently: Take 25 mg by mouth daily. Take with or immediately following a meal.) 30 tablet 3  . Multiple Vitamins-Minerals (DECUBI-VITE) CAPS Take 1 capsule by mouth daily.    Marland Kitchen omeprazole (PRILOSEC) 20 MG capsule Take 20 mg by mouth daily.    . polyethylene glycol (MIRALAX / GLYCOLAX) packet Take 17 g by mouth every other day.     . sennosides-docusate sodium (SENOKOT-S) 8.6-50 MG tablet Take 2 tablets by  mouth at bedtime.    . simvastatin (ZOCOR) 20 MG tablet Take 10 mg by mouth every morning.     . warfarin (COUMADIN) 1 MG tablet Take 1 mg by mouth daily. Currently on hold     No current facility-administered medications on file prior to visit.    Allergies  Allergen Reactions  . Codeine Other (See Comments)    UNKNOWN; Per MAR    Family History  Problem Relation Age of Onset  . Heart failure Neg Hx   . Stroke    . Heart disease    . Thyroid disease Neg Hx     BP 106/70 mmHg  Pulse 104  Temp(Src) 97.9 F (36.6 C) (Oral)  SpO2 94%   Review of Systems Denies weight change.  pt does not report muscle cramps.    Objective:   Physical Exam Vital signs: see vs page Gen: elderly, frail, no distress.  In wheelchair.  Has 02 on.      (i reviewed records from pt's facility: pt is not on either) (i discussed with Dr Glade Lloyd).    Lab Results  Component Value Date   TSH 12.40* 07/26/2014      Assessment & Plan:  vid-D deficiency.  She needs rx Hypothyroidism, due to amiodarone.  She needs rx.    Patient is advised the following: Patient Instructions  Please start levothyroxine, 25 mcg po daily. Also, please start ergocalciferol, 50,000 units po q week. In 6 weeks, please check: PTH, Ca++, 25-OH vitamin-D, and TSH.   Please forward results here.

## 2014-09-10 NOTE — ED Notes (Signed)
Patient DNR (Yellow Copy ) was left at the hospital . [name] called [facility] and spoke with Marcelino Duster she stated to destroy the copy we had and she would have the Nurse Pract. Re-write another one.

## 2014-10-22 ENCOUNTER — Encounter: Payer: Self-pay | Admitting: Endocrinology

## 2014-10-22 ENCOUNTER — Telehealth: Payer: Self-pay | Admitting: Endocrinology

## 2014-10-29 ENCOUNTER — Other Ambulatory Visit: Payer: Self-pay | Admitting: *Deleted

## 2014-10-29 MED ORDER — HYDROCODONE-ACETAMINOPHEN 5-325 MG PO TABS: ORAL_TABLET | ORAL | Status: AC

## 2014-10-29 NOTE — Telephone Encounter (Signed)
[facility] 

## 2014-11-29 DEATH — deceased
